# Patient Record
Sex: Male | Born: 1980 | Race: Black or African American | Hispanic: No | Marital: Married | Smoking: Former smoker
Health system: Southern US, Community
[De-identification: ages and names within clinical notes are randomized; demographics above are authoritative.]

## PROBLEM LIST (undated history)

## (undated) DIAGNOSIS — F419 Anxiety disorder, unspecified: Secondary | ICD-10-CM

## (undated) DIAGNOSIS — I10 Essential (primary) hypertension: Secondary | ICD-10-CM

## (undated) DIAGNOSIS — IMO0002 Reserved for concepts with insufficient information to code with codable children: Secondary | ICD-10-CM

## (undated) DIAGNOSIS — F329 Major depressive disorder, single episode, unspecified: Secondary | ICD-10-CM

## (undated) DIAGNOSIS — R011 Cardiac murmur, unspecified: Secondary | ICD-10-CM

## (undated) DIAGNOSIS — T7840XA Allergy, unspecified, initial encounter: Secondary | ICD-10-CM

## (undated) DIAGNOSIS — F32A Depression, unspecified: Secondary | ICD-10-CM

## (undated) DIAGNOSIS — K219 Gastro-esophageal reflux disease without esophagitis: Secondary | ICD-10-CM

## (undated) DIAGNOSIS — F1010 Alcohol abuse, uncomplicated: Secondary | ICD-10-CM

## (undated) HISTORY — DX: Essential (primary) hypertension: I10

## (undated) HISTORY — DX: Major depressive disorder, single episode, unspecified: F32.9

## (undated) HISTORY — DX: Allergy, unspecified, initial encounter: T78.40XA

## (undated) HISTORY — DX: Gastro-esophageal reflux disease without esophagitis: K21.9

## (undated) HISTORY — DX: Anxiety disorder, unspecified: F41.9

## (undated) HISTORY — DX: Depression, unspecified: F32.A

## (undated) HISTORY — PX: KNEE SURGERY: SHX244

## (undated) HISTORY — DX: Reserved for concepts with insufficient information to code with codable children: IMO0002

## (undated) HISTORY — DX: Cardiac murmur, unspecified: R01.1

## (undated) HISTORY — DX: Alcohol abuse, uncomplicated: F10.10

---
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
## 2005-07-23 ENCOUNTER — Ambulatory Visit: Payer: Self-pay | Admitting: Orthopaedic Surgery

## 2005-08-19 ENCOUNTER — Ambulatory Visit: Payer: Self-pay | Admitting: Orthopaedic Surgery

## 2006-04-13 ENCOUNTER — Emergency Department: Payer: Self-pay | Admitting: Emergency Medicine

## 2008-09-13 ENCOUNTER — Emergency Department (HOSPITAL_COMMUNITY): Admission: EM | Admit: 2008-09-13 | Discharge: 2008-09-13 | Payer: Self-pay | Admitting: Family Medicine

## 2012-12-03 ENCOUNTER — Ambulatory Visit (INDEPENDENT_AMBULATORY_CARE_PROVIDER_SITE_OTHER): Payer: 59 | Admitting: Internal Medicine

## 2012-12-03 ENCOUNTER — Encounter: Payer: Self-pay | Admitting: Internal Medicine

## 2012-12-03 VITALS — BP 124/80 | HR 96 | Temp 98.5°F | Ht 65.0 in | Wt 175.0 lb

## 2012-12-03 DIAGNOSIS — F329 Major depressive disorder, single episode, unspecified: Secondary | ICD-10-CM

## 2012-12-03 DIAGNOSIS — F419 Anxiety disorder, unspecified: Secondary | ICD-10-CM

## 2012-12-03 DIAGNOSIS — F17200 Nicotine dependence, unspecified, uncomplicated: Secondary | ICD-10-CM

## 2012-12-03 DIAGNOSIS — Z9109 Other allergy status, other than to drugs and biological substances: Secondary | ICD-10-CM

## 2012-12-03 DIAGNOSIS — L98499 Non-pressure chronic ulcer of skin of other sites with unspecified severity: Secondary | ICD-10-CM

## 2012-12-03 DIAGNOSIS — K219 Gastro-esophageal reflux disease without esophagitis: Secondary | ICD-10-CM

## 2012-12-03 DIAGNOSIS — F1010 Alcohol abuse, uncomplicated: Secondary | ICD-10-CM

## 2012-12-03 DIAGNOSIS — F411 Generalized anxiety disorder: Secondary | ICD-10-CM

## 2012-12-03 DIAGNOSIS — IMO0002 Reserved for concepts with insufficient information to code with codable children: Secondary | ICD-10-CM

## 2012-12-03 DIAGNOSIS — M25569 Pain in unspecified knee: Secondary | ICD-10-CM

## 2012-12-03 DIAGNOSIS — F32A Depression, unspecified: Secondary | ICD-10-CM

## 2012-12-03 DIAGNOSIS — M25562 Pain in left knee: Secondary | ICD-10-CM

## 2012-12-03 DIAGNOSIS — Z720 Tobacco use: Secondary | ICD-10-CM

## 2012-12-03 MED ORDER — FLUOXETINE HCL 10 MG PO CAPS: 10.0000 mg | ORAL_CAPSULE | Freq: Every day | ORAL | Status: DC

## 2012-12-03 MED ORDER — PANTOPRAZOLE SODIUM 40 MG PO TBEC: 40.0000 mg | DELAYED_RELEASE_TABLET | Freq: Two times a day (BID) | ORAL | Status: DC

## 2012-12-05 ENCOUNTER — Encounter: Payer: Self-pay | Admitting: Internal Medicine

## 2012-12-05 DIAGNOSIS — K219 Gastro-esophageal reflux disease without esophagitis: Secondary | ICD-10-CM | POA: Insufficient documentation

## 2012-12-05 DIAGNOSIS — Z9109 Other allergy status, other than to drugs and biological substances: Secondary | ICD-10-CM | POA: Insufficient documentation

## 2012-12-05 DIAGNOSIS — F320 Major depressive disorder, single episode, mild: Secondary | ICD-10-CM | POA: Insufficient documentation

## 2012-12-05 DIAGNOSIS — F419 Anxiety disorder, unspecified: Secondary | ICD-10-CM | POA: Insufficient documentation

## 2012-12-05 DIAGNOSIS — Z720 Tobacco use: Secondary | ICD-10-CM | POA: Insufficient documentation

## 2012-12-05 DIAGNOSIS — F1010 Alcohol abuse, uncomplicated: Secondary | ICD-10-CM | POA: Insufficient documentation

## 2012-12-05 DIAGNOSIS — K259 Gastric ulcer, unspecified as acute or chronic, without hemorrhage or perforation: Secondary | ICD-10-CM | POA: Insufficient documentation

## 2012-12-05 DIAGNOSIS — F329 Major depressive disorder, single episode, unspecified: Secondary | ICD-10-CM | POA: Insufficient documentation

## 2012-12-05 DIAGNOSIS — M25569 Pain in unspecified knee: Secondary | ICD-10-CM | POA: Insufficient documentation

## 2012-12-05 NOTE — Assessment & Plan Note (Signed)
Has a history of a bleeding ulcer requiring hospitalization and blood transfusions.  States never healed.  Discussed the need for f/u EGD.  Will restart protonix and zantac.  Follow.

## 2012-12-05 NOTE — Assessment & Plan Note (Signed)
Controls with zyrtec.  Follow.    

## 2012-12-05 NOTE — Assessment & Plan Note (Signed)
Discussed at length with him today regarding the need to quit.  He has tried nicotine patches.  Wants to try Chantix.  Will start chantix as directed.  Discussed possible side effects and risk of medication.  He will notify me if any problems.

## 2012-12-05 NOTE — Assessment & Plan Note (Signed)
No significant depression now.  More anxiety.  No suicidal ideations.  Start the prozac for anxiety as outlined.  Follow.

## 2012-12-05 NOTE — Assessment & Plan Note (Signed)
Off protonix and zantac now.  Symptoms returned.  Restart protonix and zantac.  Discussed with him regarding the need for f/u GI evaluation.

## 2012-12-05 NOTE — Progress Notes (Signed)
Subjective:    Patient ID: Austin [name], male    DOB: Nov 20, 1980, 32 y.o.   MRN: [id number]  HPI 32 year old male with past history of bleeding ulcer requiring blood transfusion, alcohol abuse, depression/anxiety, GERD and elevated blood pressures.  He comes in today to follow up on these issues as well as to establish care.  He has been followed previously at the [facility].  States he used to drink an increased amount of alcohol.  Had two DWIs.  During this time was hospitalized with a bleeding ulcer.  Required blood transfusions.  Was on protonix and zantac.  This regimen controlled his symptoms relatively well.  He has been out for a while.  Now starting to have some acid reflux.  Taking pepto bismol prn.  He has now cut down on his alcohol intake significantly.  Still smokes marijuana.  Relaxes him.  States he would rather smoke marijuana then drink.  He has adjusted his diet some.  Still has increased anxiety.  Will develop chest pressure when this occurs.  Has had a cardiac w/up including negative stress test, etc.  Saw cardiology.  States his chest pressure has not changed.  Notices when he is more stressed.  He previously took Buspar and would take 4-5 per day.  Has been evaluated at the [facility] - psychiatry.  He now has a new job.  Planning to get married in August.  Still has knee pain.  Limits him in his activity.  Was taking tramadol prn for this.     Past Medical History  Diagnosis Date  . Depression   . Ulcer     history of bleeding ulcer requiring blood transfusions  . GERD (gastroesophageal reflux disease)   . Allergy   . Heart murmur   . Hypertension   . Anxiety   . Alcohol abuse     history of    Outpatient Encounter Prescriptions as of 12/03/2012  Medication Sig Dispense Refill  . cetirizine (ZYRTEC) 10 MG tablet Take 10 mg by mouth daily.      . pantoprazole (PROTONIX) 40 MG tablet Take 1 tablet (40 mg total) by mouth 2 (two) times daily.  60 tablet  2  . ranitidine  (ZANTAC) 150 MG tablet Take 150 mg by mouth 2 (two) times daily.      . traMADol (ULTRAM) 50 MG tablet Take 50 mg by mouth every 6 (six) hours as needed for pain.      . [DISCONTINUED] pantoprazole (PROTONIX) 40 MG tablet Take 40 mg by mouth 2 (two) times daily.      Marland Kitchen FLUoxetine (PROZAC) 10 MG capsule Take 1 capsule (10 mg total) by mouth daily.  30 capsule  1   No facility-administered encounter medications on file as of 12/03/2012.    Review of Systems Patient denies any headache, lightheadedness or dizziness.  No significant sinus or allergy symptoms currently.  Takes zyrtec prn.  Reports the chest pressure as outlined.  Intermittent.  No  palpatations.  No increased shortness of breath, cough or congestion.  No nausea or vomiting.  No abdominal pain or cramping.  Does have acid reflux.  Off his protonix and zantac as outlined.  No bowel change, such as diarrhea, constipation, BRBPR or melana.  No urine change.   Has cut down his alcohol intake as outlined.  Knee pain as outlined.  Increased stress and anxiety as outlined.  He does smoke.  Discussed the need to quit.  He has tried  the patches.  Wants to try Chantix.      Objective:   Physical Exam Filed Vitals:   12/03/12 1429  BP: 124/80  Pulse: 96  Temp: 98.5 F (36.9 C)   Blood pressure recheck:  118/76, pulse 56  32 year old male in no acute distress.  HEENT:  Nares - clear.  Oropharynx - without lesions. NECK:  Supple.  Nontender.  No audible carotid bruit.  HEART:  Appears to be regular.   LUNGS:  No crackles or wheezing audible.  Respirations even and unlabored.   RADIAL PULSE:  Equal bilaterally.  ABDOMEN:  Soft.  Nontender.  Bowel sounds present and normal.  No audible abdominal bruit.   EXTREMITIES:  No increased edema present.  DP pulses palpable and equal bilaterally.     SKIN:  No rash.      Assessment & Plan:  CARDIOVASCULAR.  Discussed his chest pressure.  We discussed further w/up including EKG here in the office  today.  He declines further w/up at this time.  States he has seen cardiology and had w/up for these symptoms.  Will notify me or be reevaluated if symptoms change or worsen (or persist).   HEALTH MAINTENANCE.  Will get him scheduled for a complete physical.  Obtain outside records for review.    I spent 45 minutes with this patient and more than 50% of the time was spent in consultation regarding the above.

## 2012-12-05 NOTE — Assessment & Plan Note (Signed)
Increased stress and anxiety.  Has anxiety attacks.  Previously took Buspar prn.  Would like to get him on something more steady.  Discussed at length with him today.  Will start prozac 10mg  q day.  Follow closely.  Get him back in soon to reassess.

## 2012-12-05 NOTE — Assessment & Plan Note (Signed)
Has cut down significantly now.  Follow.

## 2012-12-05 NOTE — Assessment & Plan Note (Signed)
Chronic pain.  Was taking tramadol prn.  Will use tylenol prn as directed.  Follow.  May need ortho referral.  Obtain records.

## 2012-12-30 ENCOUNTER — Encounter: Payer: Self-pay | Admitting: Internal Medicine

## 2012-12-30 ENCOUNTER — Encounter: Payer: Self-pay | Admitting: *Deleted

## 2012-12-30 ENCOUNTER — Telehealth: Payer: Self-pay | Admitting: Internal Medicine

## 2012-12-30 NOTE — Telephone Encounter (Signed)
I do not have any lab results on this pt.  Need results.  Regarding the trip, I recommend him calling the travel clinic in [place] for recs.

## 2012-12-30 NOTE — Telephone Encounter (Signed)
Records requested (in your folder)

## 2012-12-30 NOTE — Telephone Encounter (Signed)
Labs good.  Cholesterol looks good.  See lab in your basket.

## 2012-12-30 NOTE — Telephone Encounter (Signed)
Sent results through my chart.

## 2013-01-13 ENCOUNTER — Encounter: Payer: Self-pay | Admitting: Internal Medicine

## 2013-01-14 ENCOUNTER — Encounter: Payer: Self-pay | Admitting: Internal Medicine

## 2013-01-14 ENCOUNTER — Ambulatory Visit (INDEPENDENT_AMBULATORY_CARE_PROVIDER_SITE_OTHER): Payer: 59 | Admitting: Internal Medicine

## 2013-01-14 VITALS — BP 144/80 | HR 57 | Temp 98.3°F | Ht 65.0 in | Wt 174.5 lb

## 2013-01-14 DIAGNOSIS — F329 Major depressive disorder, single episode, unspecified: Secondary | ICD-10-CM

## 2013-01-14 DIAGNOSIS — K219 Gastro-esophageal reflux disease without esophagitis: Secondary | ICD-10-CM

## 2013-01-14 DIAGNOSIS — Z9109 Other allergy status, other than to drugs and biological substances: Secondary | ICD-10-CM

## 2013-01-14 DIAGNOSIS — F419 Anxiety disorder, unspecified: Secondary | ICD-10-CM

## 2013-01-14 DIAGNOSIS — Z720 Tobacco use: Secondary | ICD-10-CM

## 2013-01-14 DIAGNOSIS — L98499 Non-pressure chronic ulcer of skin of other sites with unspecified severity: Secondary | ICD-10-CM

## 2013-01-14 DIAGNOSIS — F411 Generalized anxiety disorder: Secondary | ICD-10-CM

## 2013-01-14 DIAGNOSIS — F1010 Alcohol abuse, uncomplicated: Secondary | ICD-10-CM

## 2013-01-14 DIAGNOSIS — F17200 Nicotine dependence, unspecified, uncomplicated: Secondary | ICD-10-CM

## 2013-01-14 DIAGNOSIS — F32A Depression, unspecified: Secondary | ICD-10-CM

## 2013-01-14 DIAGNOSIS — IMO0002 Reserved for concepts with insufficient information to code with codable children: Secondary | ICD-10-CM

## 2013-01-16 ENCOUNTER — Telehealth: Payer: Self-pay | Admitting: Internal Medicine

## 2013-01-16 ENCOUNTER — Encounter: Payer: Self-pay | Admitting: Internal Medicine

## 2013-01-16 DIAGNOSIS — Z139 Encounter for screening, unspecified: Secondary | ICD-10-CM

## 2013-01-16 DIAGNOSIS — IMO0001 Reserved for inherently not codable concepts without codable children: Secondary | ICD-10-CM

## 2013-01-16 DIAGNOSIS — F419 Anxiety disorder, unspecified: Secondary | ICD-10-CM

## 2013-01-16 MED ORDER — FLUOXETINE HCL 20 MG PO TABS: 20.0000 mg | ORAL_TABLET | Freq: Every day | ORAL | Status: DC

## 2013-01-16 NOTE — Telephone Encounter (Signed)
Order placed for psych referral.   

## 2013-01-16 NOTE — Assessment & Plan Note (Signed)
Not drinking now.  Follow.

## 2013-01-16 NOTE — Assessment & Plan Note (Signed)
Controls with zyrtec.  Follow.    

## 2013-01-16 NOTE — Progress Notes (Signed)
Subjective:    Patient ID: Austin [name], male    DOB: February 27, 1981, 32 y.o.   MRN: [id number]  HPI 32 year old male with past history of bleeding ulcer requiring blood transfusion, alcohol abuse, depression/anxiety, GERD and elevated blood pressures.  He comes in today for a scheduled follow up.  Still with increased stress.  See last note for details.  Still not drinking.  Does not smoke cigarettes.  Does smoke marijuana to help him relax.  Increased stress with family issues.  Stress appears to be increasing.  Tolerating the prozac.  No adverse effects.  We discussed increasing the dose.  Still with increased anxiety.  Increased heart racing and elevated blood pressure when he gets anxious.  Planning to get married next month.  Increased stress related to the wedding.      Past Medical History  Diagnosis Date  . Depression   . Ulcer     history of bleeding ulcer requiring blood transfusions  . GERD (gastroesophageal reflux disease)   . Allergy   . Heart murmur   . Hypertension   . Anxiety   . Alcohol abuse     history of    Outpatient Encounter Prescriptions as of 01/14/2013  Medication Sig Dispense Refill  . cetirizine (ZYRTEC) 10 MG tablet Take 10 mg by mouth daily.      . pantoprazole (PROTONIX) 40 MG tablet Take 1 tablet (40 mg total) by mouth 2 (two) times daily.  60 tablet  2  . ranitidine (ZANTAC) 150 MG tablet Take 150 mg by mouth 2 (two) times daily.      . traMADol (ULTRAM) 50 MG tablet Take 50 mg by mouth every 6 (six) hours as needed for pain.      . [DISCONTINUED] FLUoxetine (PROZAC) 10 MG capsule Take 1 capsule (10 mg total) by mouth daily.  30 capsule  1  . FLUoxetine (PROZAC) 20 MG tablet Take 1 tablet (20 mg total) by mouth daily.  30 tablet  2   No facility-administered encounter medications on file as of 01/14/2013.    Review of Systems Patient denies any headache, lightheadedness or dizziness.  No significant sinus or allergy symptoms currently.  Takes zyrtec prn.    No increased shortness of breath, cough or congestion.  No nausea or vomiting.  No abdominal pain or cramping.  Back on protonix now.  No bowel change, such as diarrhea, constipation, BRBPR or melana.  No urine change.   Not drinking alcohol now.  Increased stress and anxiety as outlined.  States he is not smoking cigarettes now.  Is smoking marijuana.  Helps him relax.  Stress appears to be getting worse.  It appears family issues are his biggest stressor.  No suicidal ideations or homicidal ideations.       Objective:   Physical Exam  Filed Vitals:   01/14/13 1641  BP: 144/80  Pulse: 57  Temp: 98.3 F (36.8 C)   Blood pressure recheck:  43/26  32 year old male in no acute distress.  HEENT:  Nares - clear.  Oropharynx - without lesions. NECK:  Supple.  Nontender.  No audible carotid bruit.  HEART:  Appears to be regular.   LUNGS:  No crackles or wheezing audible.  Respirations even and unlabored.   RADIAL PULSE:  Equal bilaterally.  ABDOMEN:  Soft.  Nontender.  Bowel sounds present and normal.  No audible abdominal bruit.   EXTREMITIES:  No increased edema present.  DP pulses palpable and equal bilaterally.  Assessment & Plan:  CARDIOVASCULAR.  See last note.  Will treat his anxiety and try to help him deal with the increased stress.  Follow.    HEALTH MAINTENANCE.  Will get him scheduled for a complete physical.  Obtain outside records for review.

## 2013-01-16 NOTE — Assessment & Plan Note (Signed)
Increased stress and anxiety.  Has anxiety attacks.  Previously took Buspar prn.  On Prozac 10mg  q day.  Will increase to 20mg  q day.  Discussed at length with him today.  Will get him referred to psych for further evaluation and treatment.  His is comfortable and agreeable with this plan.  Follow closely.

## 2013-01-16 NOTE — Assessment & Plan Note (Signed)
On protonix.  Follow.  

## 2013-01-16 NOTE — Assessment & Plan Note (Signed)
Has a history of a bleeding ulcer requiring hospitalization and blood transfusions.  States never healed.  Discussed the need for f/u EGD.  Continue protonix.  Follow.

## 2013-01-16 NOTE — Assessment & Plan Note (Signed)
Chronic pain.  Was taking tramadol prn.  Will use tylenol prn as directed.  Follow.  May need ortho referral.  Need records.

## 2013-01-16 NOTE — Assessment & Plan Note (Signed)
Not smoking cigarettes now.  Follow.

## 2013-01-16 NOTE — Telephone Encounter (Signed)
Pt needs fasting labs within the next week.  He also needs a physical scheduled in 6 weeks.  Thanks.

## 2013-01-16 NOTE — Assessment & Plan Note (Signed)
No significant depression now.  More anxiety.  No suicidal ideations.  On prozac for anxiety as outlined.  Increase to 20mg  q day.  Refer to psych for evaluation and treatment.

## 2013-01-17 NOTE — Telephone Encounter (Signed)
Pt aware of lab appointment 01/26/13 and cpx 03/31/13

## 2013-01-19 ENCOUNTER — Telehealth: Payer: Self-pay | Admitting: Internal Medicine

## 2013-01-19 DIAGNOSIS — F32A Depression, unspecified: Secondary | ICD-10-CM

## 2013-01-19 DIAGNOSIS — F419 Anxiety disorder, unspecified: Secondary | ICD-10-CM

## 2013-01-19 DIAGNOSIS — F329 Major depressive disorder, single episode, unspecified: Secondary | ICD-10-CM

## 2013-01-19 NOTE — Telephone Encounter (Signed)
Order placed for psych referral.   

## 2013-01-26 ENCOUNTER — Other Ambulatory Visit (INDEPENDENT_AMBULATORY_CARE_PROVIDER_SITE_OTHER): Payer: 59

## 2013-01-26 DIAGNOSIS — R030 Elevated blood-pressure reading, without diagnosis of hypertension: Secondary | ICD-10-CM

## 2013-01-26 DIAGNOSIS — IMO0001 Reserved for inherently not codable concepts without codable children: Secondary | ICD-10-CM

## 2013-01-26 DIAGNOSIS — Z139 Encounter for screening, unspecified: Secondary | ICD-10-CM

## 2013-01-26 LAB — LIPID PANEL
Cholesterol: 160 mg/dL (ref 0–200)
LDL Cholesterol: 84 mg/dL (ref 0–99)
Triglycerides: 61 mg/dL (ref 0.0–149.0)
VLDL: 12.2 mg/dL (ref 0.0–40.0)

## 2013-01-26 LAB — COMPREHENSIVE METABOLIC PANEL
AST: 25 U/L (ref 0–37)
Alkaline Phosphatase: 83 U/L (ref 39–117)
BUN: 9 mg/dL (ref 6–23)
Glucose, Bld: 85 mg/dL (ref 70–99)
Sodium: 137 mEq/L (ref 135–145)
Total Bilirubin: 1.1 mg/dL (ref 0.3–1.2)
Total Protein: 7.6 g/dL (ref 6.0–8.3)

## 2013-01-30 ENCOUNTER — Encounter: Payer: Self-pay | Admitting: Internal Medicine

## 2013-02-15 ENCOUNTER — Encounter: Payer: Self-pay | Admitting: Internal Medicine

## 2013-02-16 ENCOUNTER — Encounter: Payer: Self-pay | Admitting: *Deleted

## 2013-02-16 MED ORDER — TRAMADOL HCL 50 MG PO TABS: 50.0000 mg | ORAL_TABLET | Freq: Three times a day (TID) | ORAL | Status: DC | PRN

## 2013-02-16 NOTE — Telephone Encounter (Signed)
Refilled tramadol.

## 2013-02-17 ENCOUNTER — Telehealth: Payer: Self-pay | Admitting: *Deleted

## 2013-02-17 NOTE — Telephone Encounter (Signed)
Pt came by to pick up Rx & I asked him about the psych referral. Pt states that he still plans to proceed but he has had a lot going on right now. He is getting married tomorrow also. He plans to set up appt soon.

## 2013-02-25 ENCOUNTER — Other Ambulatory Visit: Payer: Self-pay | Admitting: *Deleted

## 2013-03-07 ENCOUNTER — Encounter: Payer: Self-pay | Admitting: Internal Medicine

## 2013-03-24 ENCOUNTER — Encounter: Payer: Self-pay | Admitting: Internal Medicine

## 2013-03-31 ENCOUNTER — Encounter: Payer: 59 | Admitting: Internal Medicine

## 2013-04-01 ENCOUNTER — Other Ambulatory Visit: Payer: Self-pay | Admitting: *Deleted

## 2013-04-01 ENCOUNTER — Encounter: Payer: Self-pay | Admitting: *Deleted

## 2013-04-01 MED ORDER — PANTOPRAZOLE SODIUM 40 MG PO TBEC: 40.0000 mg | DELAYED_RELEASE_TABLET | Freq: Two times a day (BID) | ORAL | Status: DC

## 2013-04-01 MED ORDER — FLUOXETINE HCL 20 MG PO TABS: 20.0000 mg | ORAL_TABLET | Freq: Every day | ORAL | Status: DC

## 2013-04-01 MED ORDER — TRAMADOL HCL 50 MG PO TABS: 50.0000 mg | ORAL_TABLET | Freq: Three times a day (TID) | ORAL | Status: DC | PRN

## 2013-04-01 NOTE — Telephone Encounter (Signed)
Sent pt a mychart message reminding him that he needs to r/s his physical that he missed yesterday. And also informed that his Tramadol would have to be sent locally not through [facility]

## 2013-04-11 ENCOUNTER — Other Ambulatory Visit: Payer: Self-pay | Admitting: Internal Medicine

## 2013-04-12 ENCOUNTER — Telehealth: Payer: Self-pay | Admitting: Internal Medicine

## 2013-04-12 NOTE — Telephone Encounter (Signed)
traMADol (ULTRAM) 50 MG tablet ° °

## 2013-04-12 NOTE — Telephone Encounter (Signed)
I have already faxed this on 04/01/13. I called the pharmacy & they informed me that he has it on file. They are going to fill it today & let him know.

## 2013-05-05 ENCOUNTER — Other Ambulatory Visit: Payer: Self-pay

## 2013-05-30 ENCOUNTER — Other Ambulatory Visit: Payer: Self-pay | Admitting: Internal Medicine

## 2013-05-30 ENCOUNTER — Ambulatory Visit (INDEPENDENT_AMBULATORY_CARE_PROVIDER_SITE_OTHER): Payer: 59 | Admitting: Adult Health

## 2013-05-30 VITALS — BP 134/70 | HR 80 | Temp 99.1°F | Wt 167.0 lb

## 2013-05-30 DIAGNOSIS — R112 Nausea with vomiting, unspecified: Secondary | ICD-10-CM

## 2013-05-30 DIAGNOSIS — R197 Diarrhea, unspecified: Secondary | ICD-10-CM

## 2013-05-30 MED ORDER — PROMETHAZINE HCL 25 MG PO TABS: 25.0000 mg | ORAL_TABLET | Freq: Three times a day (TID) | ORAL | Status: DC | PRN

## 2013-05-30 NOTE — Progress Notes (Signed)
   Subjective:    Patient ID: Fynn Vanblarcom, male    DOB: [birth year], 32 y.o.   MRN: [id number]  HPI  Patient is a pleasant 32 y/o male who presents to clinic with n/v/d since Sunday morning.  He reports going to eat thanksgiving dinner with his wife's family on Saturday evening and started to feel nauseated several hours following. He is running a low grade fever.   Current Outpatient Prescriptions on File Prior to Visit  Medication Sig Dispense Refill  . cetirizine (ZYRTEC) 10 MG tablet Take 10 mg by mouth daily.      Marland Kitchen FLUoxetine (PROZAC) 20 MG tablet Take 1 tablet by mouth  daily  90 tablet  0  . pantoprazole (PROTONIX) 40 MG tablet TAKE 1 TABLET BY MOUTH TWICE DAILY  60 tablet  5  . pantoprazole (PROTONIX) 40 MG tablet Take 1 tablet by mouth two  times daily  180 tablet  1  . ranitidine (ZANTAC) 150 MG tablet Take 150 mg by mouth 2 (two) times daily.      . traMADol (ULTRAM) 50 MG tablet Take 1 tablet (50 mg total) by mouth every 8 (eight) hours as needed for pain.  40 tablet  0   No current facility-administered medications on file prior to visit.     Review of Systems  Constitutional: Positive for fever and chills.  Gastrointestinal: Positive for nausea, vomiting and diarrhea.  Psychiatric/Behavioral: The patient is hyperactive.        Objective:   Physical Exam  Constitutional: He is oriented to person, place, and time. He appears well-developed and well-nourished. No distress.  Cardiovascular: Normal rate, regular rhythm and normal heart sounds.  Exam reveals no gallop.   No murmur heard. Pulmonary/Chest: Effort normal. No respiratory distress.  Abdominal: Soft.  Hyperactive bowel sounds  Neurological: He is alert and oriented to person, place, and time.  Psychiatric: He has a normal mood and affect. His behavior is normal. Judgment and thought content normal.          Assessment & Plan:

## 2013-05-30 NOTE — Assessment & Plan Note (Signed)
Symptoms occurred after eating at family gathering on Saturday evening. Phenergan for n/v. Clear liquids today. May introduce bland diet tomorrow. Return to work on Wednesday if symptoms subsided x 24 hours.

## 2013-05-30 NOTE — Patient Instructions (Signed)
Start phenergan 25 mg every 8 hours as needed for nausea and vomiting. This may cause sedation. If too strong, may cut pill in half.  Drink clear liquids today.  Start a bland diet tomorrow - dry toast, plain potato. No heavy sauces, spices, etc.  If no improvement within 2-3 days please call.    Nausea and Vomiting Nausea is a sick feeling that often comes before throwing up (vomiting). Vomiting is a reflex where stomach contents come out of your mouth. Vomiting can cause severe loss of body fluids (dehydration). Children and elderly adults can become dehydrated quickly, especially if they also have diarrhea. Nausea and vomiting are symptoms of a condition or disease. It is important to find the cause of your symptoms. CAUSES   Direct irritation of the stomach lining. This irritation can result from increased acid production (gastroesophageal reflux disease), infection, food poisoning, taking certain medicines (such as nonsteroidal anti-inflammatory drugs), alcohol use, or tobacco use.  Signals from the brain.These signals could be caused by a headache, heat exposure, an inner ear disturbance, increased pressure in the brain from injury, infection, a tumor, or a concussion, pain, emotional stimulus, or metabolic problems.  An obstruction in the gastrointestinal tract (bowel obstruction).  Illnesses such as diabetes, hepatitis, gallbladder problems, appendicitis, kidney problems, cancer, sepsis, atypical symptoms of a heart attack, or eating disorders.  Medical treatments such as chemotherapy and radiation.  Receiving medicine that makes you sleep (general anesthetic) during surgery. DIAGNOSIS Your caregiver may ask for tests to be done if the problems do not improve after a few days. Tests may also be done if symptoms are severe or if the reason for the nausea and vomiting is not clear. Tests may include:  Urine tests.  Blood tests.  Stool tests.  Cultures (to look for evidence  of infection).  X-rays or other imaging studies. Test results can help your caregiver make decisions about treatment or the need for additional tests. TREATMENT You need to stay well hydrated. Drink frequently but in small amounts.You may wish to drink water, sports drinks, clear broth, or eat frozen ice pops or gelatin dessert to help stay hydrated.When you eat, eating slowly may help prevent nausea.There are also some antinausea medicines that may help prevent nausea. HOME CARE INSTRUCTIONS   Take all medicine as directed by your caregiver.  If you do not have an appetite, do not force yourself to eat. However, you must continue to drink fluids.  If you have an appetite, eat a normal diet unless your caregiver tells you differently.  Eat a variety of complex carbohydrates (rice, wheat, potatoes, bread), lean meats, yogurt, fruits, and vegetables.  Avoid high-fat foods because they are more difficult to digest.  Drink enough water and fluids to keep your urine clear or pale yellow.  If you are dehydrated, ask your caregiver for specific rehydration instructions. Signs of dehydration may include:  Severe thirst.  Dry lips and mouth.  Dizziness.  Dark urine.  Decreasing urine frequency and amount.  Confusion.  Rapid breathing or pulse. SEEK IMMEDIATE MEDICAL CARE IF:   You have blood or brown flecks (like coffee grounds) in your vomit.  You have black or bloody stools.  You have a severe headache or stiff neck.  You are confused.  You have severe abdominal pain.  You have chest pain or trouble breathing.  You do not urinate at least once every 8 hours.  You develop cold or clammy skin.  You continue to vomit for longer  than 24 to 48 hours.  You have a fever. MAKE SURE YOU:   Understand these instructions.  Will watch your condition.  Will get help right away if you are not doing well or get worse. Document Released: 06/16/2005 Document Revised:  09/08/2011 Document Reviewed: 11/13/2010 [facility] Patient Information 2014 [place], [place].

## 2013-05-30 NOTE — Progress Notes (Signed)
Pre visit review using our clinic review tool, if applicable. No additional management support is needed unless otherwise documented below in the visit note. 

## 2013-06-09 ENCOUNTER — Encounter: Payer: Self-pay | Admitting: Internal Medicine

## 2013-06-09 ENCOUNTER — Ambulatory Visit (INDEPENDENT_AMBULATORY_CARE_PROVIDER_SITE_OTHER): Payer: 59 | Admitting: Internal Medicine

## 2013-06-09 VITALS — BP 130/80 | HR 65 | Temp 98.2°F | Ht 64.25 in | Wt 173.0 lb

## 2013-06-09 DIAGNOSIS — L98499 Non-pressure chronic ulcer of skin of other sites with unspecified severity: Secondary | ICD-10-CM

## 2013-06-09 DIAGNOSIS — Z720 Tobacco use: Secondary | ICD-10-CM

## 2013-06-09 DIAGNOSIS — F32A Depression, unspecified: Secondary | ICD-10-CM

## 2013-06-09 DIAGNOSIS — IMO0002 Reserved for concepts with insufficient information to code with codable children: Secondary | ICD-10-CM

## 2013-06-09 DIAGNOSIS — R0789 Other chest pain: Secondary | ICD-10-CM

## 2013-06-09 DIAGNOSIS — F17200 Nicotine dependence, unspecified, uncomplicated: Secondary | ICD-10-CM

## 2013-06-09 DIAGNOSIS — F3289 Other specified depressive episodes: Secondary | ICD-10-CM

## 2013-06-09 DIAGNOSIS — F329 Major depressive disorder, single episode, unspecified: Secondary | ICD-10-CM

## 2013-06-09 DIAGNOSIS — F1010 Alcohol abuse, uncomplicated: Secondary | ICD-10-CM

## 2013-06-09 DIAGNOSIS — F411 Generalized anxiety disorder: Secondary | ICD-10-CM

## 2013-06-09 DIAGNOSIS — K219 Gastro-esophageal reflux disease without esophagitis: Secondary | ICD-10-CM

## 2013-06-09 DIAGNOSIS — F419 Anxiety disorder, unspecified: Secondary | ICD-10-CM

## 2013-06-09 DIAGNOSIS — Z9109 Other allergy status, other than to drugs and biological substances: Secondary | ICD-10-CM

## 2013-06-09 MED ORDER — FLUTICASONE PROPIONATE 50 MCG/ACT NA SUSP: 2.0000 | Freq: Every day | NASAL | Status: DC

## 2013-06-09 NOTE — Progress Notes (Signed)
Pre-visit discussion using our clinic review tool. No additional management support is needed unless otherwise documented below in the visit note.  

## 2013-06-09 NOTE — Progress Notes (Signed)
Subjective:    Patient ID: Austin [name], male    DOB: 02/02/1981, 32 y.o.   MRN: [id number]  HPI 32 year old male with past history of bleeding ulcer requiring blood transfusion, alcohol abuse, depression/anxiety, GERD and elevated blood pressures.  He comes in today to follow up on these issues as well as for a complete physical exam. Still with increased stress.  See last note for details.  Started smoking cigarettes since his last visit.  Does smoke marijuana to help him relax.  Increased stress with family issues.  Tolerating the prozac.  No adverse effects.  Still with increased anxiety.  Increased heart racing and chest pressure at times.  Breathing overall stable.  He does report some increased drainage.  Sore throat.  Some break through acid production.  Takes pepto bismol or Tums.  Some hoarseness.  Taking zyrtec and benadryl.  Has developed some cough over the last 10 days.       Past Medical History  Diagnosis Date  . Depression   . Ulcer     history of bleeding ulcer requiring blood transfusions  . GERD (gastroesophageal reflux disease)   . Allergy   . Heart murmur   . Hypertension   . Anxiety   . Alcohol abuse     history of    Outpatient Encounter Prescriptions as of 06/09/2013  Medication Sig  . cetirizine (ZYRTEC) 10 MG tablet Take 10 mg by mouth daily.  Marland Kitchen FLUoxetine (PROZAC) 20 MG tablet Take 1 tablet by mouth  daily  . pantoprazole (PROTONIX) 40 MG tablet Take 1 tablet by mouth two  times daily  . promethazine (PHENERGAN) 25 MG tablet Take 1 tablet (25 mg total) by mouth every 8 (eight) hours as needed for nausea or vomiting.  . ranitidine (ZANTAC) 150 MG tablet Take 150 mg by mouth 2 (two) times daily.  . traMADol (ULTRAM) 50 MG tablet Take 1 tablet (50 mg total) by mouth every 8 (eight) hours as needed for pain.  . [DISCONTINUED] pantoprazole (PROTONIX) 40 MG tablet TAKE 1 TABLET BY MOUTH TWICE DAILY    Review of Systems Patient denies any headache, lightheadedness  or dizziness.  Some increased drainage and sore throat.  Some hoarseness.  Takes zyrtec prn.   No increased shortness of breath.  Some cough.  No nausea or vomiting.  No abdominal pain or cramping.  Back on protonix now.  Still some breakthrough acid.  Taking pepto bismol and Tums.  No bowel change, such as diarrhea, constipation, BRBPR or melana.  No urine change.    Increased stress and anxiety as outlined.  States he has started smoking cigarettes again.  Is smoking marijuana.  Helps him relax.  Increased stress.  We had referred him to psych previously.  Not able to go.  Is agreeable now for referral.  Taking his prozac.  Still has some increased heart racing and chest tightness.        Objective:   Physical Exam  Filed Vitals:   06/09/13 1111  BP: 130/80  Pulse: 65  Temp: 98.2 F (36.8 C)   Blood pressure recheck:  122/78, pulse 73  32 year old male in no acute distress.  HEENT:  Nares - clear.  Oropharynx - without lesions. NECK:  Supple.  Nontender.  No audible carotid bruit.  HEART:  Appears to be regular.   LUNGS:  No crackles or wheezing audible.  Respirations even and unlabored.   RADIAL PULSE:  Equal bilaterally.  ABDOMEN:  Soft.  Nontender.  Bowel sounds present and normal.  No audible abdominal bruit.  GU:  Normal descended testicles.  No palpable testicular nodules.   RECTAL:  Not performed.   EXTREMITIES:  No increased edema present.  DP pulses palpable and equal bilaterally.         Assessment & Plan:  CARDIOVASCULAR.  Still with intermittent chest tightness and heart racing.  Previously felt to be related to increased stress.  On prozac.  Still continuing.  EKG obtained and revealed SR with no acute ischemic changes.  Given persistent symptoms, will obtain stress test to confirm no active ischemia.    HEALTH MAINTENANCE.  Physical today.

## 2013-06-09 NOTE — Patient Instructions (Signed)
Can take robitussin twice a day as needed.  Flonase nasal spray - 2 sprays each nostril one time per day.  Do in the evening.  Saline nasal spray - flush nose at least 2-3x/day.   Take your protonix - one before breakfast and one before your evening meal.  Take your zantac before bed.

## 2013-06-12 ENCOUNTER — Encounter: Payer: Self-pay | Admitting: Internal Medicine

## 2013-06-12 NOTE — Assessment & Plan Note (Signed)
No significant depression now.  More anxiety.  No suicidal ideations.  On prozac for anxiety as outlined.  On 20mg  q day.  Refer to psych for evaluation and treatment.

## 2013-06-12 NOTE — Assessment & Plan Note (Signed)
Usually controls with zyrtec.  Now with increased drainage and cough.  Treat acid reflux.  Flonase nasal spray and saline nasal spray as directed.  Follow.  Notify me if symptom worsen or do not resolve.

## 2013-06-12 NOTE — Assessment & Plan Note (Signed)
Has a history of a bleeding ulcer requiring hospitalization and blood transfusions.  States never healed.  Discussed the need for f/u EGD.  Still with increased acid reflux despite protonix.  Increase protonix to bid.  Refer to GI for further evaluation and need for EGD.

## 2013-06-12 NOTE — Assessment & Plan Note (Signed)
States he only rarely drinks now.  Nothing on a regular basis. Follow.

## 2013-06-12 NOTE — Assessment & Plan Note (Signed)
On protonix  Still with break through symptoms.  Increase protonix to bid.  Refer to GI for evaluation for possible EGD.

## 2013-06-12 NOTE — Assessment & Plan Note (Signed)
Have discussed at length with him regarding the need to quit.  He has tried nicotine patches.  Wanted to try Chantix.  Still smoking.  Follow.

## 2013-06-12 NOTE — Assessment & Plan Note (Signed)
Increased stress and anxiety.  Has anxiety attacks.  Previously took Buspar prn.  On Prozac 20mg  q day.  Discussed at length with him today.  Will get him referred to psych for further evaluation and treatment.  His is comfortable and agreeable with this plan.  Follow closely.

## 2013-06-13 ENCOUNTER — Encounter: Payer: Self-pay | Admitting: Emergency Medicine

## 2013-06-14 ENCOUNTER — Telehealth: Payer: Self-pay | Admitting: Emergency Medicine

## 2013-06-14 NOTE — Telephone Encounter (Signed)
LVM for patient to call our office in reference to Stress Echo scheduled with [facility], located at [address] apt scheduled on 06/17/13 @ 9am.

## 2013-06-15 NOTE — Telephone Encounter (Signed)
LVM #2 to return our call to confirm he has seen his apt with [facility] on Friday

## 2013-06-16 ENCOUNTER — Telehealth: Payer: Self-pay | Admitting: *Deleted

## 2013-06-16 NOTE — Telephone Encounter (Signed)
Left patient voicemail to remind him he had stress echo tomorrow at 0900.

## 2013-06-17 ENCOUNTER — Other Ambulatory Visit (INDEPENDENT_AMBULATORY_CARE_PROVIDER_SITE_OTHER): Payer: 59

## 2013-06-17 DIAGNOSIS — R0789 Other chest pain: Secondary | ICD-10-CM

## 2013-06-17 DIAGNOSIS — R079 Chest pain, unspecified: Secondary | ICD-10-CM

## 2013-06-18 ENCOUNTER — Encounter: Payer: Self-pay | Admitting: Internal Medicine

## 2013-06-27 ENCOUNTER — Ambulatory Visit: Payer: 59

## 2013-07-05 ENCOUNTER — Ambulatory Visit (INDEPENDENT_AMBULATORY_CARE_PROVIDER_SITE_OTHER): Payer: 59 | Admitting: Psychology

## 2013-07-05 DIAGNOSIS — F1210 Cannabis abuse, uncomplicated: Secondary | ICD-10-CM

## 2013-07-20 ENCOUNTER — Ambulatory Visit: Payer: 59 | Admitting: Psychology

## 2013-08-17 ENCOUNTER — Ambulatory Visit: Payer: 59 | Admitting: Internal Medicine

## 2013-09-26 ENCOUNTER — Telehealth: Payer: Self-pay | Admitting: Internal Medicine

## 2013-09-26 NOTE — Telephone Encounter (Signed)
Pt came into office requesting refill of tramadol.  States he has been out for a while.

## 2013-09-27 NOTE — Telephone Encounter (Signed)
Okay to refill? Last seen in December 2014

## 2013-09-28 ENCOUNTER — Other Ambulatory Visit: Payer: Self-pay | Admitting: *Deleted

## 2013-09-28 MED ORDER — TRAMADOL HCL 50 MG PO TABS: 50.0000 mg | ORAL_TABLET | Freq: Three times a day (TID) | ORAL | Status: DC | PRN

## 2013-09-28 NOTE — Telephone Encounter (Signed)
Please confirm why he needs the medication.  Any new or acute pain or problems?  Just need more info.  Thanks.  Also, he needs a f/u appt with me ( ) appt.

## 2013-09-28 NOTE — Telephone Encounter (Signed)
Pt states the he has the same problems he always had, nothing new & pain hasn't increased. He has been trying to use Tylenol in place of the Tramadol. Pt notified that he will need to schedule a 30 minute follow-up soon.

## 2013-09-29 NOTE — Telephone Encounter (Signed)
Rx faxed to pharmacy  

## 2013-11-14 ENCOUNTER — Telehealth: Payer: Self-pay | Admitting: Internal Medicine

## 2013-11-14 ENCOUNTER — Encounter: Payer: Self-pay | Admitting: *Deleted

## 2013-11-14 NOTE — Telephone Encounter (Signed)
Sent mychart to patient recommending that he goes to ER or acute care if this is a new or recent problem with the tightening in chest and numbness through fingertips.

## 2013-11-14 NOTE — Telephone Encounter (Signed)
See below my chart message is it ok to wait until 5/21  Appointment For: Austin [name],Austin [name] ([id number])  Visit Type: MYCHART OFFICE VISIT (1064) 11/17/2013 11:00 AM 15 mins.  Charm Bargesharlene S Scott, MD [facility]  Patient Comments: Office Visit need refill on anxiety medication as well as tramadol. Also just discuss general health issues. Still tightening in chest and numbness through fingertips

## 2013-11-17 ENCOUNTER — Encounter: Payer: Self-pay | Admitting: Internal Medicine

## 2013-11-17 ENCOUNTER — Ambulatory Visit (INDEPENDENT_AMBULATORY_CARE_PROVIDER_SITE_OTHER): Payer: 59 | Admitting: Internal Medicine

## 2013-11-17 VITALS — BP 130/76 | HR 83 | Temp 98.5°F | Resp 16 | Ht 64.25 in | Wt 168.0 lb

## 2013-11-17 DIAGNOSIS — M79609 Pain in unspecified limb: Secondary | ICD-10-CM

## 2013-11-17 DIAGNOSIS — F329 Major depressive disorder, single episode, unspecified: Secondary | ICD-10-CM

## 2013-11-17 DIAGNOSIS — Z720 Tobacco use: Secondary | ICD-10-CM

## 2013-11-17 DIAGNOSIS — IMO0002 Reserved for concepts with insufficient information to code with codable children: Secondary | ICD-10-CM

## 2013-11-17 DIAGNOSIS — F17200 Nicotine dependence, unspecified, uncomplicated: Secondary | ICD-10-CM

## 2013-11-17 DIAGNOSIS — F411 Generalized anxiety disorder: Secondary | ICD-10-CM

## 2013-11-17 DIAGNOSIS — R079 Chest pain, unspecified: Secondary | ICD-10-CM

## 2013-11-17 DIAGNOSIS — K219 Gastro-esophageal reflux disease without esophagitis: Secondary | ICD-10-CM

## 2013-11-17 DIAGNOSIS — F1010 Alcohol abuse, uncomplicated: Secondary | ICD-10-CM

## 2013-11-17 DIAGNOSIS — F3289 Other specified depressive episodes: Secondary | ICD-10-CM

## 2013-11-17 DIAGNOSIS — F32A Depression, unspecified: Secondary | ICD-10-CM

## 2013-11-17 DIAGNOSIS — F419 Anxiety disorder, unspecified: Secondary | ICD-10-CM

## 2013-11-17 DIAGNOSIS — M79602 Pain in left arm: Secondary | ICD-10-CM

## 2013-11-17 DIAGNOSIS — L98499 Non-pressure chronic ulcer of skin of other sites with unspecified severity: Secondary | ICD-10-CM

## 2013-11-17 MED ORDER — FLUOXETINE HCL 20 MG PO TABS: 20.0000 mg | ORAL_TABLET | Freq: Every day | ORAL | Status: DC

## 2013-11-17 NOTE — Progress Notes (Signed)
Pre visit review using our clinic review tool, if applicable. No additional management support is needed unless otherwise documented below in the visit note. 

## 2013-11-21 ENCOUNTER — Encounter: Payer: Self-pay | Admitting: Internal Medicine

## 2013-11-21 DIAGNOSIS — R079 Chest pain, unspecified: Secondary | ICD-10-CM | POA: Insufficient documentation

## 2013-11-21 NOTE — Assessment & Plan Note (Signed)
Chest pain as outlined.  Having chest and arm pain and numbness.  Exam as outlined.  Symptoms reproducible on exam.  Recent stress test negative.  Appears to be related more to an underlying nerve.  Will have him wear cock up wrist splints if tolerates.  Hold on further cardiac testing.  Offered repeat EKG today to confirm no change.  He declines.  Will schedule NCS upper extremities.  Further w/up pending results.

## 2013-11-21 NOTE — Assessment & Plan Note (Signed)
Has a history of a bleeding ulcer requiring hospitalization and blood transfusions.  States never healed.  Discussed the need for f/u EGD.  Was referred to GI for further evaluation and need for EGD.  On protonix.

## 2013-11-21 NOTE — Assessment & Plan Note (Signed)
Increased stress and anxiety.  Has anxiety attacks.  Previously took Buspar prn.  Was on Prozac 20mg  q day.  Ran out.  Plans to restart.  Felt was doing well on the prozac.  Follow.

## 2013-11-21 NOTE — Assessment & Plan Note (Signed)
Restart prozac as outlined.

## 2013-11-21 NOTE — Assessment & Plan Note (Signed)
On protonix  No reported symptoms today.  Follow.   

## 2013-11-21 NOTE — Assessment & Plan Note (Signed)
Have discussed at length with him regarding the need to quit.  He has tried nicotine patches.  Still smoking.  Follow.     

## 2013-11-21 NOTE — Progress Notes (Signed)
Subjective:    Patient ID: Austin [name], male    DOB: 03-05-1981, 33 y.o.   MRN: [id number]  Chest Pain   33 year old male with past history of bleeding ulcer requiring blood transfusion, alcohol abuse, depression/anxiety, GERD and elevated blood pressures.  He comes in today for a scheduled follow up.  Still with increased stress.  See previous notes for details.  Is smoking.  Discussed the need to quit.   Does smoke marijuana to help him relax.  Increased stress with family issues.  Off prozac.  Ran out of medication.  No adverse effects.  Chest pressure at times.  Breathing overall stable.  He reports shooting pain across his chest and will notice numbness in his fingers and left arm.  No motor weakness.  Certain position changes and palpation around the elbow reproduces the pain and discomfort.  He also reports some worsening symptoms when he wakes up.  Uses his hands a lot.  On the computer a lot.      Past Medical History  Diagnosis Date  . Depression   . Ulcer     history of bleeding ulcer requiring blood transfusions  . GERD (gastroesophageal reflux disease)   . Allergy   . Heart murmur   . Hypertension   . Anxiety   . Alcohol abuse     history of    Outpatient Encounter Prescriptions as of 11/17/2013  Medication Sig  . cetirizine (ZYRTEC) 10 MG tablet Take 10 mg by mouth daily.  Marland Kitchen FLUoxetine (PROZAC) 20 MG tablet Take 1 tablet (20 mg total) by mouth daily.  . fluticasone (FLONASE) 50 MCG/ACT nasal spray Place 2 sprays into both nostrils daily.  . pantoprazole (PROTONIX) 40 MG tablet Take 1 tablet by mouth two  times daily  . promethazine (PHENERGAN) 25 MG tablet Take 1 tablet (25 mg total) by mouth every 8 (eight) hours as needed for nausea or vomiting.  . ranitidine (ZANTAC) 150 MG tablet Take 150 mg by mouth 2 (two) times daily.  . traMADol (ULTRAM) 50 MG tablet Take 1 tablet (50 mg total) by mouth every 8 (eight) hours as needed.  . [DISCONTINUED] FLUoxetine (PROZAC) 20 MG  tablet Take 1 tablet by mouth  daily    Review of Systems  Cardiovascular: Positive for chest pain.  Patient denies any headache, lightheadedness or dizziness.  No increased shortness of breath.  No cough or congestion.  No nausea or vomiting.  No abdominal pain or cramping.  On protonix.  No increased acid reflux reported today.  No bowel change, such as diarrhea, constipation, BRBPR or melana.  No urine change.    Increased stress and anxiety as outlined.  States he has started smoking cigarettes again.  Is smoking marijuana.  Helps him relax.  Increased stress.  We had referred him to psych previously.  Off his prozac.  Ran out.  Plans to restart.  Describes the chest discomfort and arm sensation changes as outlined.  Feels better when he exercises.        Objective:   Physical Exam  Filed Vitals:   11/17/13 1100  BP: 130/76  Pulse: 83  Temp: 98.5 F (36.9 C)  Resp: 54   33 year old male in no acute distress.  HEENT:  Nares - clear.  Oropharynx - without lesions. NECK:  Supple.  Nontender.  No audible carotid bruit.  HEART:  Appears to be regular.   LUNGS:  No crackles or wheezing audible.  Respirations even and  unlabored. CHEST WALL:  Some reproducible tenderness to palpation over the left anterior chest wall.    RADIAL PULSE:  Equal bilaterally.  ABDOMEN:  Soft.  Nontender.  Bowel sounds present and normal.  No audible abdominal bruit.   EXTREMITIES:  No increased edema present.  DP pulses palpable and equal bilaterally.   MSK:  Reproducible symptoms with palpation over the left elbow.  Positive phalens.  Grip strength appears to be equal bilateral hands.         Assessment & Plan:  HEALTH MAINTENANCE.  Physical 06/09/13.   I spent 25 minutes with the patient and more than 50% of the time was spent in consultation regarding the above.

## 2013-11-21 NOTE — Assessment & Plan Note (Signed)
Alcohol intake has increased some recently.  We discussed this at length.  Explained that if we restart the prozac, he is going to have to stop the alcohol.  He states he would.  Does not drink on a regular basis.  Follow.

## 2013-11-24 ENCOUNTER — Encounter: Payer: Self-pay | Admitting: Internal Medicine

## 2013-11-25 MED ORDER — PANTOPRAZOLE SODIUM 40 MG PO TBEC: DELAYED_RELEASE_TABLET | ORAL | Status: DC

## 2013-11-25 NOTE — Telephone Encounter (Signed)
Pt lost his [facility] slip, needs another. I couldn't find what labs he needs

## 2014-01-04 ENCOUNTER — Encounter: Payer: Self-pay | Admitting: Internal Medicine

## 2014-01-04 ENCOUNTER — Ambulatory Visit (INDEPENDENT_AMBULATORY_CARE_PROVIDER_SITE_OTHER): Payer: 59 | Admitting: Internal Medicine

## 2014-01-04 VITALS — BP 120/80 | HR 67 | Temp 98.6°F | Ht 64.25 in | Wt 171.2 lb

## 2014-01-04 DIAGNOSIS — F1010 Alcohol abuse, uncomplicated: Secondary | ICD-10-CM

## 2014-01-04 DIAGNOSIS — F329 Major depressive disorder, single episode, unspecified: Secondary | ICD-10-CM

## 2014-01-04 DIAGNOSIS — F17200 Nicotine dependence, unspecified, uncomplicated: Secondary | ICD-10-CM

## 2014-01-04 DIAGNOSIS — F32A Depression, unspecified: Secondary | ICD-10-CM

## 2014-01-04 DIAGNOSIS — F419 Anxiety disorder, unspecified: Secondary | ICD-10-CM

## 2014-01-04 DIAGNOSIS — K219 Gastro-esophageal reflux disease without esophagitis: Secondary | ICD-10-CM

## 2014-01-04 DIAGNOSIS — Z9109 Other allergy status, other than to drugs and biological substances: Secondary | ICD-10-CM

## 2014-01-04 DIAGNOSIS — F411 Generalized anxiety disorder: Secondary | ICD-10-CM

## 2014-01-04 DIAGNOSIS — F3289 Other specified depressive episodes: Secondary | ICD-10-CM

## 2014-01-04 DIAGNOSIS — Z720 Tobacco use: Secondary | ICD-10-CM

## 2014-01-04 DIAGNOSIS — R079 Chest pain, unspecified: Secondary | ICD-10-CM

## 2014-01-04 NOTE — Progress Notes (Signed)
Pre visit review using our clinic review tool, if applicable. No additional management support is needed unless otherwise documented below in the visit note. 

## 2014-01-08 ENCOUNTER — Encounter: Payer: Self-pay | Admitting: Internal Medicine

## 2014-01-08 NOTE — Assessment & Plan Note (Signed)
On protonix  No reported symptoms today.  Follow.   

## 2014-01-08 NOTE — Assessment & Plan Note (Signed)
He has cut back on his alcohol.  Instructed to quit.  Follow.

## 2014-01-08 NOTE — Assessment & Plan Note (Signed)
Plans to stop smoking.  Follow.

## 2014-01-08 NOTE — Progress Notes (Signed)
Subjective:    Patient ID: Austin [name], male    DOB: 1981/06/27, 33 y.o.   MRN: [id number]  HPI 33 year old male with past history of bleeding ulcer requiring blood transfusion, alcohol abuse, depression/anxiety, GERD and elevated blood pressures.  He comes in today for a scheduled follow up.   Still with increased stress.  See last note for details.  Stress is some better.  Does smoke marijuana to help him relax.  On prozac.  Tolerating the prozac.  No adverse effects.  Still with increased anxiety.  Increased heart racing and chest pressure better.  Breathing overall stable.  Was having some issues with his hand and arm.  See last note for details.  Had NCS.  No abnormality noted.  Felt to be more muscle strain.  Is better with increased exercise.  He is swimming.  Feels better after he swims.  Overall feeling better.  He has cut back on his alcohol intake.  Wife is pregnant.       Past Medical History  Diagnosis Date  . Depression   . Ulcer     history of bleeding ulcer requiring blood transfusions  . GERD (gastroesophageal reflux disease)   . Allergy   . Heart murmur   . Hypertension   . Anxiety   . Alcohol abuse     history of    Outpatient Encounter Prescriptions as of 01/04/2014  Medication Sig  . cetirizine (ZYRTEC) 10 MG tablet Take 10 mg by mouth daily.  Marland Kitchen. FLUoxetine (PROZAC) 20 MG tablet Take 1 tablet (20 mg total) by mouth daily.  . fluticasone (FLONASE) 50 MCG/ACT nasal spray Place 2 sprays into both nostrils daily.  . pantoprazole (PROTONIX) 40 MG tablet Take 1 tablet by mouth two  times daily  . promethazine (PHENERGAN) 25 MG tablet Take 1 tablet (25 mg total) by mouth every 8 (eight) hours as needed for nausea or vomiting.  . ranitidine (ZANTAC) 150 MG tablet Take 150 mg by mouth 2 (two) times daily.  . traMADol (ULTRAM) 50 MG tablet Take 1 tablet (50 mg total) by mouth every 8 (eight) hours as needed.    Review of Systems Patient denies any headache, lightheadedness  or dizziness.   No increased shortness of breath.  No cough or congestion. No nausea or vomiting.  No abdominal pain or cramping.  On protonix.   No bowel change, such as diarrhea, constipation, BRBPR or melana.  No urine change.    Increased stress and anxiety as outlined.  Is smoking marijuana.  Helps him relax. Has cut back down on his alcohol.  We discussed stopping completely.  He plans to stop smoking.  We had referred him to psych.  He went.  No changes made per his report.  Does not feel he needs to return.  Arm discomfort better.  Work up as outlined.  Overall doing better.          Objective:   Physical Exam  Filed Vitals:   01/04/14 1301  BP: 120/80  Pulse: 67  Temp: 98.6 F (7337 C)   33 year old male in no acute distress.  HEENT:  Nares - clear.  Oropharynx - without lesions. NECK:  Supple.  Nontender.  No audible carotid bruit.  HEART:  Appears to be regular.   LUNGS:  No crackles or wheezing audible.  Respirations even and unlabored.   RADIAL PULSE:  Equal bilaterally.  ABDOMEN:  Soft.  Nontender.  Bowel sounds present and normal.  No audible abdominal bruit.    EXTREMITIES:  No increased edema present.  DP pulses palpable and equal bilaterally.         Assessment & Plan:  CARDIOVASCULAR.  Stable.  Actually improved.  Follow.     HEALTH MAINTENANCE.  Physical last visit.

## 2014-01-08 NOTE — Assessment & Plan Note (Signed)
Increased stress and anxiety.  Has anxiety attacks.  Better on prozac.  Better overall.  Follow.

## 2014-01-08 NOTE — Assessment & Plan Note (Signed)
On prozac and doing better.  Follow.   

## 2014-01-08 NOTE — Assessment & Plan Note (Signed)
Controls with zyrtec.  Follow.    

## 2014-01-08 NOTE — Assessment & Plan Note (Signed)
Chest pain as outlined.  Having chest and arm pain and numbness.  Exam as outlined.  Symptoms reproducible on exam.  Recent stress test negative.  Had NCS.  Negative.  Better with exercise.  Follow.

## 2014-01-17 ENCOUNTER — Telehealth: Payer: Self-pay | Admitting: *Deleted

## 2014-01-17 DIAGNOSIS — F1010 Alcohol abuse, uncomplicated: Secondary | ICD-10-CM

## 2014-01-17 DIAGNOSIS — F32A Depression, unspecified: Secondary | ICD-10-CM

## 2014-01-17 DIAGNOSIS — F329 Major depressive disorder, single episode, unspecified: Secondary | ICD-10-CM

## 2014-01-17 DIAGNOSIS — R079 Chest pain, unspecified: Secondary | ICD-10-CM

## 2014-01-17 DIAGNOSIS — F419 Anxiety disorder, unspecified: Secondary | ICD-10-CM

## 2014-01-17 DIAGNOSIS — K219 Gastro-esophageal reflux disease without esophagitis: Secondary | ICD-10-CM

## 2014-01-17 DIAGNOSIS — Z13220 Encounter for screening for lipoid disorders: Secondary | ICD-10-CM

## 2014-01-17 NOTE — Telephone Encounter (Signed)
Pt is coming in tomorrow what labs and dx?  

## 2014-01-17 NOTE — Telephone Encounter (Signed)
Labs ordered.

## 2014-01-18 ENCOUNTER — Other Ambulatory Visit (INDEPENDENT_AMBULATORY_CARE_PROVIDER_SITE_OTHER): Payer: 59

## 2014-01-18 DIAGNOSIS — K219 Gastro-esophageal reflux disease without esophagitis: Secondary | ICD-10-CM

## 2014-01-18 DIAGNOSIS — Z13220 Encounter for screening for lipoid disorders: Secondary | ICD-10-CM

## 2014-01-18 DIAGNOSIS — F32A Depression, unspecified: Secondary | ICD-10-CM

## 2014-01-18 DIAGNOSIS — F329 Major depressive disorder, single episode, unspecified: Secondary | ICD-10-CM

## 2014-01-18 DIAGNOSIS — F1010 Alcohol abuse, uncomplicated: Secondary | ICD-10-CM

## 2014-01-18 DIAGNOSIS — F3289 Other specified depressive episodes: Secondary | ICD-10-CM

## 2014-01-18 LAB — CBC WITH DIFFERENTIAL/PLATELET
Basophils Absolute: 0 10*3/uL (ref 0.0–0.1)
Basophils Relative: 0.6 % (ref 0.0–3.0)
EOS ABS: 0.3 10*3/uL (ref 0.0–0.7)
Eosinophils Relative: 3.5 % (ref 0.0–5.0)
HEMATOCRIT: 43.4 % (ref 39.0–52.0)
HEMOGLOBIN: 14.4 g/dL (ref 13.0–17.0)
LYMPHS ABS: 2.7 10*3/uL (ref 0.7–4.0)
Lymphocytes Relative: 36.7 % (ref 12.0–46.0)
MCHC: 33.2 g/dL (ref 30.0–36.0)
MCV: 91.1 fl (ref 78.0–100.0)
Monocytes Absolute: 0.6 10*3/uL (ref 0.1–1.0)
Monocytes Relative: 7.8 % (ref 3.0–12.0)
NEUTROS ABS: 3.8 10*3/uL (ref 1.4–7.7)
Neutrophils Relative %: 51.4 % (ref 43.0–77.0)
Platelets: 306 10*3/uL (ref 150.0–400.0)
RBC: 4.76 Mil/uL (ref 4.22–5.81)
RDW: 13.1 % (ref 11.5–15.5)
WBC: 7.4 10*3/uL (ref 4.0–10.5)

## 2014-01-18 LAB — COMPREHENSIVE METABOLIC PANEL
ALK PHOS: 75 U/L (ref 39–117)
ALT: 25 U/L (ref 0–53)
AST: 24 U/L (ref 0–37)
Albumin: 4.3 g/dL (ref 3.5–5.2)
BUN: 8 mg/dL (ref 6–23)
CO2: 32 mEq/L (ref 19–32)
CREATININE: 0.9 mg/dL (ref 0.4–1.5)
Calcium: 9.6 mg/dL (ref 8.4–10.5)
Chloride: 97 mEq/L (ref 96–112)
GFR: 119.36 mL/min (ref >60.00)
Glucose, Bld: 107 mg/dL — ABNORMAL HIGH (ref 70–99)
Potassium: 4.6 mEq/L (ref 3.5–5.1)
SODIUM: 138 meq/L (ref 135–145)
TOTAL PROTEIN: 7.3 g/dL (ref 6.0–8.3)
Total Bilirubin: 1 mg/dL (ref 0.2–1.2)

## 2014-01-18 LAB — LIPID PANEL
Cholesterol: 173 mg/dL (ref 0–200)
HDL: 78.1 mg/dL (ref >39.00)
LDL Cholesterol: 55 mg/dL (ref 0–99)
NonHDL: 94.9
TRIGLYCERIDES: 200 mg/dL — AB (ref 0.0–149.0)
Total CHOL/HDL Ratio: 2
VLDL: 40 mg/dL (ref 0.0–40.0)

## 2014-01-18 LAB — TSH: TSH: 0.58 u[IU]/mL (ref 0.35–4.50)

## 2014-01-18 LAB — VITAMIN B12: Vitamin B-12: 583 pg/mL (ref 211–911)

## 2014-01-19 ENCOUNTER — Encounter: Payer: Self-pay | Admitting: Internal Medicine

## 2014-01-23 NOTE — Telephone Encounter (Signed)
Unread mychart message mailed to patient 

## 2014-01-31 ENCOUNTER — Encounter: Payer: Self-pay | Admitting: Internal Medicine

## 2014-02-06 ENCOUNTER — Other Ambulatory Visit: Payer: Self-pay | Admitting: Internal Medicine

## 2014-02-06 ENCOUNTER — Other Ambulatory Visit: Payer: Self-pay | Admitting: Adult Health

## 2014-02-07 MED ORDER — TRAMADOL HCL 50 MG PO TABS
50.0000 mg | ORAL_TABLET | Freq: Two times a day (BID) | ORAL | Status: DC | PRN
Start: 2014-02-07 — End: 2014-05-17

## 2014-02-07 NOTE — Telephone Encounter (Signed)
Refill

## 2014-02-07 NOTE — Telephone Encounter (Signed)
Request sent in other encounter

## 2014-02-07 NOTE — Telephone Encounter (Signed)
I refilled the tramadol #30 with no refills.  I did not refill phenergan.  I have not been prescribing this.  Not sure why he needs.

## 2014-02-21 MED ORDER — FLUOXETINE HCL 20 MG PO TABS: 20.0000 mg | ORAL_TABLET | Freq: Every day | ORAL | Status: DC

## 2014-02-21 NOTE — Addendum Note (Signed)
Addended by: Chandra Batch E on: 02/21/2014 03:40 PM   Modules accepted: Orders

## 2014-04-07 ENCOUNTER — Ambulatory Visit (INDEPENDENT_AMBULATORY_CARE_PROVIDER_SITE_OTHER): Payer: 59 | Admitting: Internal Medicine

## 2014-04-07 ENCOUNTER — Encounter: Payer: Self-pay | Admitting: Internal Medicine

## 2014-04-07 VITALS — BP 130/80 | HR 64 | Temp 98.4°F | Ht 64.25 in | Wt 171.5 lb

## 2014-04-07 DIAGNOSIS — R400 Somnolence: Secondary | ICD-10-CM | POA: Insufficient documentation

## 2014-04-07 DIAGNOSIS — F32A Depression, unspecified: Secondary | ICD-10-CM

## 2014-04-07 DIAGNOSIS — G4710 Hypersomnia, unspecified: Secondary | ICD-10-CM

## 2014-04-07 DIAGNOSIS — F1010 Alcohol abuse, uncomplicated: Secondary | ICD-10-CM

## 2014-04-07 DIAGNOSIS — R0683 Snoring: Secondary | ICD-10-CM

## 2014-04-07 DIAGNOSIS — K219 Gastro-esophageal reflux disease without esophagitis: Secondary | ICD-10-CM

## 2014-04-07 DIAGNOSIS — F419 Anxiety disorder, unspecified: Secondary | ICD-10-CM

## 2014-04-07 DIAGNOSIS — Z23 Encounter for immunization: Secondary | ICD-10-CM

## 2014-04-07 DIAGNOSIS — Z720 Tobacco use: Secondary | ICD-10-CM

## 2014-04-07 DIAGNOSIS — F329 Major depressive disorder, single episode, unspecified: Secondary | ICD-10-CM

## 2014-04-07 NOTE — Progress Notes (Signed)
Pre visit review using our clinic review tool, if applicable. No additional management support is needed unless otherwise documented below in the visit note. 

## 2014-04-16 ENCOUNTER — Encounter: Payer: Self-pay | Admitting: Internal Medicine

## 2014-04-16 NOTE — Progress Notes (Signed)
Subjective:    Patient ID: Austin SinclairCory E Feild, male    DOB: Nov 23, 1980, 33 y.o.   MRN: [id number]  HPI 33 year old male with past history of bleeding ulcer requiring blood transfusion, alcohol abuse, depression/anxiety, GERD and elevated blood pressures.  He comes in today for a scheduled follow up.   Stress is better.  Overall he feels better.  Is exercising.  MSK complaints better.  Does smoke marijuana to help him relax.  On prozac.  Tolerating the prozac.  No adverse effects.   Breathing overall stable.  Was having some issues with his hand and arm.  See last note for details.  Had NCS.  No abnormality noted.  Felt to be more muscle strain.  Is better with increased exercise. Has stopped drinking again.  Wife is pregnant.  He does question sleep apnea.  Has night sweats.  Wakes himself up at night.  Daytime somnolence.  Witness apnea and snores.      Past Medical History  Diagnosis Date  . Depression   . Ulcer     history of bleeding ulcer requiring blood transfusions  . GERD (gastroesophageal reflux disease)   . Allergy   . Heart murmur   . Hypertension   . Anxiety   . Alcohol abuse     history of    Outpatient Encounter Prescriptions as of 04/07/2014  Medication Sig  . cetirizine (ZYRTEC) 10 MG tablet Take 10 mg by mouth daily.  Marland Kitchen. FLUoxetine (PROZAC) 20 MG tablet Take 1 tablet (20 mg total) by mouth daily.  . fluticasone (FLONASE) 50 MCG/ACT nasal spray Place 2 sprays into both nostrils daily.  . pantoprazole (PROTONIX) 40 MG tablet Take 1 tablet by mouth two  times daily  . promethazine (PHENERGAN) 25 MG tablet Take 1 tablet (25 mg total) by mouth every 8 (eight) hours as needed for nausea or vomiting.  . ranitidine (ZANTAC) 150 MG tablet Take 150 mg by mouth 2 (two) times daily.  . traMADol (ULTRAM) 50 MG tablet Take 1 tablet (50 mg total) by mouth 2 (two) times daily as needed.    Review of Systems Patient denies any headache, lightheadedness or dizziness.   No increased shortness  of breath.  No cough or congestion. No nausea or vomiting.  No abdominal pain or cramping.  On protonix.   No bowel change, such as diarrhea, constipation, BRBPR or melana.  No urine change.    Increased stress and anxiety - better.  Is smoking marijuana.  Helps him relax. Has cut back down on his alcohol.   We had referred him to psych.  He went.  No changes made per his report.  Does not feel he needs to return.  Arm discomfort better.  Work up as outlined.  Overall doing better.  Feels better.  Concern over the possibility of sleep apnea as outlined.   Also reports a rash.  No significant itching.  Just concerned because persistent.  Torso.        Objective:   Physical Exam  Filed Vitals:   04/07/14 1619  BP: 130/80  Pulse: 64  Temp: 98.4 F (2336.629 C)   33 year old male in no acute distress.  HEENT:  Nares - clear.  Oropharynx - without lesions. NECK:  Supple.  Nontender.  No audible carotid bruit.  HEART:  Appears to be regular.   LUNGS:  No crackles or wheezing audible.  Respirations even and unlabored.   RADIAL PULSE:  Equal bilaterally.  ABDOMEN:  Soft.  Nontender.  Bowel sounds present and normal.  No audible abdominal bruit.    EXTREMITIES:  No increased edema present.  DP pulses palpable and equal bilaterally.         Assessment & Plan:  CARDIOVASCULAR.  Stable.  Actually improved.  Follow.     HEALTH MAINTENANCE.  Physical 06/09/13   I spent 25 minutes with the patient and more than 50% of the time was spent in consultation regarding the above.

## 2014-04-16 NOTE — Assessment & Plan Note (Signed)
He has cut back on his alcohol.  Basically has quit.  Follow.

## 2014-04-16 NOTE — Assessment & Plan Note (Signed)
Doing better.  On prozac.  Follow.   

## 2014-04-16 NOTE — Assessment & Plan Note (Signed)
On protonix  No reported symptoms today.  Follow.

## 2014-04-16 NOTE — Assessment & Plan Note (Signed)
Better on prozac.  Better overall.  Follow.

## 2014-04-16 NOTE — Assessment & Plan Note (Signed)
Schedule split night sleep study as outlined.

## 2014-04-16 NOTE — Assessment & Plan Note (Signed)
Have discussed at length with him regarding the need to quit.  He has tried nicotine patches.  Still smoking.  Follow.

## 2014-04-25 ENCOUNTER — Ambulatory Visit: Payer: Self-pay | Admitting: Internal Medicine

## 2014-04-28 ENCOUNTER — Encounter: Payer: Self-pay | Admitting: *Deleted

## 2014-05-12 NOTE — Telephone Encounter (Signed)
Unread mychart message mailed to patient 

## 2014-05-16 ENCOUNTER — Other Ambulatory Visit: Payer: Self-pay | Admitting: Internal Medicine

## 2014-05-17 MED ORDER — FLUOXETINE HCL 20 MG PO TABS
20.0000 mg | ORAL_TABLET | Freq: Every day | ORAL | Status: DC
Start: 2014-05-17 — End: 2014-09-07

## 2014-05-17 MED ORDER — TRAMADOL HCL 50 MG PO TABS: 50.0000 mg | ORAL_TABLET | Freq: Two times a day (BID) | ORAL | Status: DC | PRN

## 2014-05-17 NOTE — Telephone Encounter (Signed)
Ok refill tramadol? Last refill 02/07/14 #30

## 2014-05-17 NOTE — Telephone Encounter (Signed)
Called to pharmacy 

## 2014-05-17 NOTE — Telephone Encounter (Signed)
Refilled tramadol #30 with no refills.   

## 2014-05-22 ENCOUNTER — Encounter: Payer: Self-pay | Admitting: Internal Medicine

## 2014-07-07 ENCOUNTER — Other Ambulatory Visit: Payer: Self-pay | Admitting: Internal Medicine

## 2014-07-07 MED ORDER — TRAMADOL HCL 50 MG PO TABS: 50.0000 mg | ORAL_TABLET | Freq: Every day | ORAL | Status: DC | PRN

## 2014-07-07 NOTE — Telephone Encounter (Signed)
Last visit 04/07/14

## 2014-07-07 NOTE — Telephone Encounter (Signed)
Refilled tramadol #30 with no refills.   

## 2014-07-10 ENCOUNTER — Encounter: Payer: Self-pay | Admitting: Internal Medicine

## 2014-07-10 ENCOUNTER — Ambulatory Visit (INDEPENDENT_AMBULATORY_CARE_PROVIDER_SITE_OTHER): Payer: 59 | Admitting: Internal Medicine

## 2014-07-10 VITALS — BP 110/70 | HR 60 | Temp 97.9°F | Ht 64.25 in | Wt 170.2 lb

## 2014-07-10 DIAGNOSIS — K219 Gastro-esophageal reflux disease without esophagitis: Secondary | ICD-10-CM

## 2014-07-10 DIAGNOSIS — F419 Anxiety disorder, unspecified: Secondary | ICD-10-CM

## 2014-07-10 DIAGNOSIS — Z720 Tobacco use: Secondary | ICD-10-CM

## 2014-07-10 DIAGNOSIS — F1010 Alcohol abuse, uncomplicated: Secondary | ICD-10-CM

## 2014-07-10 MED ORDER — PANTOPRAZOLE SODIUM 40 MG PO TBEC: DELAYED_RELEASE_TABLET | ORAL | Status: DC

## 2014-07-10 NOTE — Progress Notes (Signed)
Pre visit review using our clinic review tool, if applicable. No additional management support is needed unless otherwise documented below in the visit note. 

## 2014-07-10 NOTE — Telephone Encounter (Signed)
Faxed to pharmacy

## 2014-07-13 ENCOUNTER — Ambulatory Visit (INDEPENDENT_AMBULATORY_CARE_PROVIDER_SITE_OTHER): Payer: 59 | Admitting: *Deleted

## 2014-07-13 DIAGNOSIS — Z23 Encounter for immunization: Secondary | ICD-10-CM

## 2014-07-16 ENCOUNTER — Encounter: Payer: Self-pay | Admitting: Internal Medicine

## 2014-07-16 NOTE — Progress Notes (Signed)
Subjective:    Patient ID: Austin [name], male    DOB: 01/20/1981, 34 y.o.   MRN: [id number]  HPI 34 year old male with past history of bleeding ulcer requiring blood transfusion, alcohol abuse, depression/anxiety, GERD and elevated blood pressures.  He comes in today for a scheduled follow up.   Stress is better.  Overall he feels better.  Is exercising.  MSK complaints better.  Does smoke marijuana to help him relax.  On prozac.  Tolerating the prozac.  No adverse effects.   Breathing overall stable.  Was having some issues with his hand and arm.  See previous notes for details.  Had NCS.  No abnormality noted.  Felt to be more muscle strain.  Is better with increased exercise.  Not a problem for him now.   Has stopped drinking again.  Wife is pregnant.  Has stopped smoking cigarettes.  Overall he feels he is doing well.       Past Medical History  Diagnosis Date  . Depression   . Ulcer     history of bleeding ulcer requiring blood transfusions  . GERD (gastroesophageal reflux disease)   . Allergy   . Heart murmur   . Hypertension   . Anxiety   . Alcohol abuse     history of    Outpatient Encounter Prescriptions as of 07/10/2014  Medication Sig  . cetirizine (ZYRTEC) 10 MG tablet Take 10 mg by mouth daily.  Marland Kitchen. FLUoxetine (PROZAC) 20 MG tablet Take 1 tablet (20 mg total) by mouth daily.  . fluticasone (FLONASE) 50 MCG/ACT nasal spray Place 2 sprays into both nostrils daily.  . pantoprazole (PROTONIX) 40 MG tablet Take 1 tablet by mouth two  times daily  . promethazine (PHENERGAN) 25 MG tablet Take 1 tablet (25 mg total) by mouth every 8 (eight) hours as needed for nausea or vomiting.  . ranitidine (ZANTAC) 150 MG tablet Take 150 mg by mouth 2 (two) times daily.  . traMADol (ULTRAM) 50 MG tablet Take 1 tablet (50 mg total) by mouth daily as needed.  . [DISCONTINUED] pantoprazole (PROTONIX) 40 MG tablet Take 1 tablet by mouth two  times daily    Review of Systems Patient denies any  headache, lightheadedness or dizziness.   No increased shortness of breath.  No cough or congestion. No nausea or vomiting.  No abdominal pain or cramping.  On protonix.   No bowel change, such as diarrhea, constipation, BRBPR or melana.  No urine change.    Increased stress and anxiety - better.  Is smoking marijuana.  Helps him relax. Has cut back down on his alcohol.   We had referred him to psych.  He went.  No changes made per his report.  Did not feel he needs to return.  Arm discomfort better.  Work up as outlined.  Not an issue for him now. Overall doing better.  Feels better.        Objective:   Physical Exam  Filed Vitals:   07/10/14 1440  BP: 110/70  Pulse: 60  Temp: 97.9 F (2836.656 C)   34 year old male in no acute distress.  HEENT:  Nares - clear.  Oropharynx - without lesions. NECK:  Supple.  Nontender.  No audible carotid bruit.  HEART:  Appears to be regular.   LUNGS:  No crackles or wheezing audible.  Respirations even and unlabored.   RADIAL PULSE:  Equal bilaterally.  ABDOMEN:  Soft.  Nontender.  Bowel sounds present  and normal.  No audible abdominal bruit.    EXTREMITIES:  No increased edema present.  DP pulses palpable and equal bilaterally.         Assessment & Plan:  1. Anxiety Better.  On prozac.  Follow.    2. Tobacco abuse Has quit smoking.    3. Alcohol abuse Discussed the need to quit.  Not drinking now.    4. Gastroesophageal reflux disease, esophagitis presence not specified Controlled on protonix.  Follow.   5. CARDIOVASCULAR.  Stable.  Actually improved.  Follow.     HEALTH MAINTENANCE.  Physical 06/09/13.  Schedule him for a physical.

## 2014-07-25 ENCOUNTER — Encounter: Payer: Self-pay | Admitting: Internal Medicine

## 2014-07-25 MED ORDER — PANTOPRAZOLE SODIUM 40 MG PO TBEC: DELAYED_RELEASE_TABLET | ORAL | Status: DC

## 2014-09-06 ENCOUNTER — Other Ambulatory Visit: Payer: Self-pay | Admitting: Internal Medicine

## 2014-09-07 ENCOUNTER — Other Ambulatory Visit: Payer: Self-pay | Admitting: *Deleted

## 2014-09-07 MED ORDER — TRAMADOL HCL 50 MG PO TABS: 50.0000 mg | ORAL_TABLET | Freq: Every day | ORAL | Status: DC | PRN

## 2014-09-07 MED ORDER — FLUOXETINE HCL 20 MG PO TABS: 20.0000 mg | ORAL_TABLET | Freq: Every day | ORAL | Status: DC

## 2014-09-07 NOTE — Telephone Encounter (Signed)
Refilled prozac #90 with no refills.  Refilled tramadol x 1

## 2014-09-07 NOTE — Telephone Encounter (Signed)
Tramadol faxed.

## 2014-10-09 ENCOUNTER — Ambulatory Visit (INDEPENDENT_AMBULATORY_CARE_PROVIDER_SITE_OTHER): Payer: 59 | Admitting: Internal Medicine

## 2014-10-09 ENCOUNTER — Encounter: Payer: Self-pay | Admitting: Internal Medicine

## 2014-10-09 VITALS — BP 122/80 | HR 64 | Temp 98.6°F | Resp 14 | Ht 64.25 in | Wt 171.2 lb

## 2014-10-09 DIAGNOSIS — K219 Gastro-esophageal reflux disease without esophagitis: Secondary | ICD-10-CM

## 2014-10-09 DIAGNOSIS — F329 Major depressive disorder, single episode, unspecified: Secondary | ICD-10-CM

## 2014-10-09 DIAGNOSIS — Z720 Tobacco use: Secondary | ICD-10-CM | POA: Diagnosis not present

## 2014-10-09 DIAGNOSIS — F1010 Alcohol abuse, uncomplicated: Secondary | ICD-10-CM

## 2014-10-09 DIAGNOSIS — F419 Anxiety disorder, unspecified: Secondary | ICD-10-CM | POA: Diagnosis not present

## 2014-10-09 DIAGNOSIS — F32A Depression, unspecified: Secondary | ICD-10-CM

## 2014-10-09 NOTE — Progress Notes (Signed)
Patient ID: Austin SinclairCory E Montee, male   DOB: 1981-02-19, 34 y.o.   MRN: [id number]   Subjective:    Patient ID: Austin [name], male    DOB: 1981-02-19, 34 y.o.   MRN: [id number]  HPI  Patient here for his physical exam.  States he is doing relatively well.  Daughter born 08/10/14.  Doing well.  Some increased stress related to this.  Feels he is handling things relatively well.  Not smoking.  Has cut down alcohol significantly.  Still smoking marijuana.  Neck/arms/hands better. Exercises help.  No cardiac symptoms with increased activity or exertion.  Breathing stable.  Bowels stable.     Past Medical History  Diagnosis Date  . Depression   . Ulcer     history of bleeding ulcer requiring blood transfusions  . GERD (gastroesophageal reflux disease)   . Allergy   . Heart murmur   . Hypertension   . Anxiety   . Alcohol abuse     history of    Current Outpatient Prescriptions on File Prior to Visit  Medication Sig Dispense Refill  . cetirizine (ZYRTEC) 10 MG tablet Take 10 mg by mouth daily.    . fluticasone (FLONASE) 50 MCG/ACT nasal spray Place 2 sprays into both nostrils daily. 16 g 2  . pantoprazole (PROTONIX) 40 MG tablet Take 1 tablet by mouth two  times daily 180 tablet 1  . promethazine (PHENERGAN) 25 MG tablet Take 1 tablet (25 mg total) by mouth every 8 (eight) hours as needed for nausea or vomiting. 20 tablet 0  . ranitidine (ZANTAC) 150 MG tablet Take 150 mg by mouth 2 (two) times daily.    . traMADol (ULTRAM) 50 MG tablet Take 1 tablet (50 mg total) by mouth daily as needed. 30 tablet 0   No current facility-administered medications on file prior to visit.    Review of Systems  Constitutional: Negative for appetite change and unexpected weight change.  HENT: Negative for congestion and sinus pressure.   Eyes: Negative for pain and visual disturbance.  Respiratory: Negative for cough, chest tightness and shortness of breath.   Cardiovascular: Negative for chest pain,  palpitations and leg swelling.  Gastrointestinal: Negative for nausea, vomiting, abdominal pain and diarrhea.  Genitourinary: Negative for dysuria and difficulty urinating.  Musculoskeletal: Negative for back pain and joint swelling.       Neck/arm/hand pains - improved.    Skin: Negative for color change and rash.  Neurological: Negative for dizziness, light-headedness and headaches.  Hematological: Negative for adenopathy. Does not bruise/bleed easily.  Psychiatric/Behavioral: Negative for dysphoric mood and agitation.       Objective:     Blood pressure recheck:  128/78  Physical Exam  Constitutional: He is oriented to person, place, and time. He appears well-developed and well-nourished. No distress.  HENT:  Head: Normocephalic and atraumatic.  Nose: Nose normal.  Mouth/Throat: Oropharynx is clear and moist. No oropharyngeal exudate.  Eyes: Conjunctivae are normal. Right eye exhibits no discharge. Left eye exhibits no discharge.  Neck: Neck supple. No thyromegaly present.  Cardiovascular: Normal rate and regular rhythm.   Pulmonary/Chest: Breath sounds normal. No respiratory distress. He has no wheezes.  Abdominal: Soft. Bowel sounds are normal. There is no tenderness.  Genitourinary:  Normal descended testicles.  No palpable nodules present.   Musculoskeletal: He exhibits no edema or tenderness.  Lymphadenopathy:    He has no cervical adenopathy.  Neurological: He is alert and oriented to person, place, and time.  Skin:  Skin is warm and dry. No rash noted.  Psychiatric: He has a normal mood and affect. His behavior is normal.    BP 122/80 mmHg  Pulse 64  Temp(Src) 98.6 F (37 C) (Oral)  Resp 14  Ht 5' 4.25" (1.632 m)  Wt 171 lb 4 oz (77.678 kg)  BMI 29.16 kg/m2  SpO2 99% Wt Readings from Last 3 Encounters:  10/09/14 171 lb 4 oz (77.678 kg)  07/10/14 170 lb 4 oz (77.225 kg)  04/07/14 171 lb 8 oz (77.792 kg)     Lab Results  Component Value Date   WBC 7.4  01/18/2014   HGB 14.4 01/18/2014   HCT 43.4 01/18/2014   PLT 306.0 01/18/2014   GLUCOSE 107* 01/18/2014   CHOL 173 01/18/2014   TRIG 200.0* 01/18/2014   HDL 78.10 01/18/2014   LDLCALC 55 01/18/2014   ALT 25 01/18/2014   AST 24 01/18/2014   NA 138 01/18/2014   K 4.6 01/18/2014   CL 97 01/18/2014   CREATININE 0.9 01/18/2014   BUN 8 01/18/2014   CO2 32 01/18/2014   TSH 0.58 01/18/2014   HGBA1C 6.1 01/26/2013       Assessment & Plan:   Problem List Items Addressed This Visit    Alcohol abuse    Has cut down significantly his alcohol intake.  Rarely drinks now.  Follow.       Anxiety    Doing well on prozac.  Follow.       Depression    Doing well on prozac.  Follow.       GERD (gastroesophageal reflux disease) - Primary    Symptoms controlled.  Follow.       Tobacco abuse    No longer smoking.  Follow.            [place], MD

## 2014-10-09 NOTE — Progress Notes (Signed)
Pre visit review using our clinic review tool, if applicable. No additional management support is needed unless otherwise documented below in the visit note. 

## 2014-10-10 ENCOUNTER — Encounter: Payer: Self-pay | Admitting: Internal Medicine

## 2014-10-10 NOTE — Assessment & Plan Note (Signed)
Doing well on prozac.  Follow  

## 2014-10-10 NOTE — Assessment & Plan Note (Signed)
Has cut down significantly his alcohol intake.  Rarely drinks now.  Follow.

## 2014-10-10 NOTE — Assessment & Plan Note (Signed)
Symptoms controlled.  Follow.   

## 2014-10-10 NOTE — Assessment & Plan Note (Signed)
No longer smoking.  Follow.

## 2014-10-13 ENCOUNTER — Other Ambulatory Visit: Payer: Self-pay | Admitting: Internal Medicine

## 2014-10-14 ENCOUNTER — Other Ambulatory Visit: Payer: Self-pay | Admitting: Internal Medicine

## 2014-10-14 ENCOUNTER — Telehealth: Payer: Self-pay | Admitting: Internal Medicine

## 2014-10-14 MED ORDER — FLUOXETINE HCL 20 MG PO TABS: 20.0000 mg | ORAL_TABLET | Freq: Every day | ORAL | Status: DC

## 2014-10-14 NOTE — Progress Notes (Signed)
Refilled prozac #90 with one refill.   

## 2014-10-14 NOTE — Telephone Encounter (Signed)
See how often pt is taking tramadol.  Given on prozac, would like to get him off tramadol.  There can be an interaction with these medications.  I know he has not been taking the tramadol on a regular basis, but I would like to see if we can stop it all together .

## 2014-10-15 ENCOUNTER — Encounter: Payer: Self-pay | Admitting: Internal Medicine

## 2014-10-15 NOTE — Assessment & Plan Note (Signed)
Doing well on prozac.  Follow  

## 2014-11-08 ENCOUNTER — Telehealth: Payer: Self-pay | Admitting: *Deleted

## 2014-11-08 ENCOUNTER — Other Ambulatory Visit (INDEPENDENT_AMBULATORY_CARE_PROVIDER_SITE_OTHER): Payer: 59

## 2014-11-08 DIAGNOSIS — E7800 Pure hypercholesterolemia, unspecified: Secondary | ICD-10-CM

## 2014-11-08 DIAGNOSIS — R739 Hyperglycemia, unspecified: Secondary | ICD-10-CM

## 2014-11-08 NOTE — Telephone Encounter (Signed)
Orders placed.

## 2014-11-08 NOTE — Telephone Encounter (Signed)
Labs and dx?  

## 2014-11-09 ENCOUNTER — Encounter: Payer: Self-pay | Admitting: Internal Medicine

## 2014-11-09 LAB — COMPREHENSIVE METABOLIC PANEL
ALT: 15 U/L (ref 0–53)
AST: 17 U/L (ref 0–37)
Albumin: 4.6 g/dL (ref 3.5–5.2)
Alkaline Phosphatase: 73 U/L (ref 39–117)
BILIRUBIN TOTAL: 0.4 mg/dL (ref 0.2–1.2)
BUN: 7 mg/dL (ref 6–23)
CO2: 31 meq/L (ref 19–32)
Calcium: 9.7 mg/dL (ref 8.4–10.5)
Chloride: 101 mEq/L (ref 96–112)
Creatinine, Ser: 0.93 mg/dL (ref 0.40–1.50)
GFR: 120.24 mL/min (ref >60.00)
Glucose, Bld: 82 mg/dL (ref 70–99)
Potassium: 4.1 mEq/L (ref 3.5–5.1)
Sodium: 138 mEq/L (ref 135–145)
Total Protein: 7.4 g/dL (ref 6.0–8.3)

## 2014-11-09 LAB — LIPID PANEL
CHOLESTEROL: 173 mg/dL (ref 0–200)
HDL: 70.4 mg/dL (ref >39.00)
LDL Cholesterol: 86 mg/dL (ref 0–99)
NonHDL: 102.6
Total CHOL/HDL Ratio: 2
Triglycerides: 84 mg/dL (ref 0.0–149.0)
VLDL: 16.8 mg/dL (ref 0.0–40.0)

## 2014-11-09 LAB — HEMOGLOBIN A1C: HEMOGLOBIN A1C: 5.8 % (ref 4.6–6.5)

## 2014-11-13 NOTE — Telephone Encounter (Signed)
Unread mychart message mailed to patient 

## 2014-11-29 ENCOUNTER — Other Ambulatory Visit: Payer: Self-pay | Admitting: Internal Medicine

## 2014-11-29 ENCOUNTER — Encounter: Payer: Self-pay | Admitting: Internal Medicine

## 2014-12-04 ENCOUNTER — Encounter: Payer: Self-pay | Admitting: Internal Medicine

## 2014-12-04 NOTE — Telephone Encounter (Signed)
Ok to change to capsule, see mychart msg

## 2014-12-05 MED ORDER — FLUOXETINE HCL 20 MG PO CAPS: 20.0000 mg | ORAL_CAPSULE | Freq: Every day | ORAL | Status: DC

## 2014-12-05 NOTE — Telephone Encounter (Signed)
Ok to change to capsule and send rx to [facility].

## 2015-02-08 ENCOUNTER — Ambulatory Visit (INDEPENDENT_AMBULATORY_CARE_PROVIDER_SITE_OTHER): Payer: 59 | Admitting: Internal Medicine

## 2015-02-08 ENCOUNTER — Encounter: Payer: Self-pay | Admitting: Internal Medicine

## 2015-02-08 VITALS — BP 132/78 | HR 71 | Temp 98.7°F | Ht 64.25 in | Wt 176.4 lb

## 2015-02-08 DIAGNOSIS — F419 Anxiety disorder, unspecified: Secondary | ICD-10-CM

## 2015-02-08 DIAGNOSIS — F32A Depression, unspecified: Secondary | ICD-10-CM

## 2015-02-08 DIAGNOSIS — K219 Gastro-esophageal reflux disease without esophagitis: Secondary | ICD-10-CM

## 2015-02-08 DIAGNOSIS — F1099 Alcohol use, unspecified with unspecified alcohol-induced disorder: Secondary | ICD-10-CM | POA: Diagnosis not present

## 2015-02-08 DIAGNOSIS — F329 Major depressive disorder, single episode, unspecified: Secondary | ICD-10-CM

## 2015-02-08 DIAGNOSIS — Z789 Other specified health status: Secondary | ICD-10-CM

## 2015-02-08 DIAGNOSIS — Z7289 Other problems related to lifestyle: Secondary | ICD-10-CM

## 2015-02-08 MED ORDER — PANTOPRAZOLE SODIUM 40 MG PO TBEC: DELAYED_RELEASE_TABLET | ORAL | Status: DC

## 2015-02-08 MED ORDER — FLUOXETINE HCL 40 MG PO CAPS: 40.0000 mg | ORAL_CAPSULE | Freq: Every day | ORAL | Status: DC

## 2015-02-08 NOTE — Progress Notes (Signed)
Patient ID: Austin [name], male   DOB: 11-04-1980, 34 y.o.   MRN: [id number]   Subjective:    Patient ID: Austin [name], male    DOB: 05/23/1981, 34 y.o.   MRN: [id number]  HPI  Patient here for a scheduled follow up.  Increased stress - work and family issues.  Discussed his alcohol intake.  Is drinking some.  No a large amount.  Discussed stopping.  He agrees.  Still smokes marijuana.  Does this instead of drinking.  Feels needs to increase the prozac.  No suicidal ideations.  Does feel down at times.  Feels stressed at times.  Stays active.  No cardiac symptoms with increased activity or exertion.  No cardiac symptoms with increased activity or exertion.  No sob.  Bowels stable.  Eating and drinking well.     Past Medical History  Diagnosis Date  . Depression   . Ulcer     history of bleeding ulcer requiring blood transfusions  . GERD (gastroesophageal reflux disease)   . Allergy   . Heart murmur   . Hypertension   . Anxiety   . Alcohol abuse     history of    Family history and social history reviewed.     Outpatient Encounter Prescriptions as of 02/08/2015  Medication Sig  . cetirizine (ZYRTEC) 10 MG tablet Take 10 mg by mouth daily.  . fluticasone (FLONASE) 50 MCG/ACT nasal spray Place 2 sprays into both nostrils daily.  . pantoprazole (PROTONIX) 40 MG tablet Take 1 tablet by mouth two  times daily  . ranitidine (ZANTAC) 150 MG tablet Take 150 mg by mouth 2 (two) times daily.  . [DISCONTINUED] FLUoxetine (PROZAC) 20 MG capsule Take 1 capsule (20 mg total) by mouth daily.  . [DISCONTINUED] pantoprazole (PROTONIX) 40 MG tablet Take 1 tablet by mouth two  times daily  . FLUoxetine (PROZAC) 40 MG capsule Take 1 capsule (40 mg total) by mouth daily.  . [DISCONTINUED] promethazine (PHENERGAN) 25 MG tablet Take 1 tablet (25 mg total) by mouth every 8 (eight) hours as needed for nausea or vomiting.  . [DISCONTINUED] traMADol (ULTRAM) 50 MG tablet Take 1 tablet (50 mg total) by mouth  daily as needed. (Patient not taking: Reported on 02/08/2015)   No facility-administered encounter medications on file as of 02/08/2015.    Review of Systems  Constitutional: Negative for appetite change and unexpected weight change.  HENT: Negative for congestion and sinus pressure.   Respiratory: Negative for cough, chest tightness and shortness of breath.   Cardiovascular: Negative for chest pain, palpitations and leg swelling.  Gastrointestinal: Negative for nausea, vomiting, abdominal pain and diarrhea.  Genitourinary: Negative for dysuria and difficulty urinating.  Musculoskeletal: Negative for back pain and joint swelling.  Skin: Negative for color change and rash.  Neurological: Negative for dizziness, light-headedness and headaches.  Hematological: Negative for adenopathy. Does not bruise/bleed easily.  Psychiatric/Behavioral: Negative for dysphoric mood and agitation.       Objective:     Blood pressure rechecked by me:  132/78  Physical Exam  Constitutional: He appears well-developed and well-nourished. No distress.  HENT:  Nose: Nose normal.  Mouth/Throat: Oropharynx is clear and moist.  Neck: Neck supple.  Cardiovascular: Normal rate and regular rhythm.   Pulmonary/Chest: Effort normal and breath sounds normal. No respiratory distress.  Abdominal: Soft. Bowel sounds are normal. There is no tenderness.  Musculoskeletal: He exhibits no edema or tenderness.  Lymphadenopathy:    He has no cervical adenopathy.  Skin: No rash noted. No erythema.  Psychiatric: He has a normal mood and affect. His behavior is normal.    BP 132/78 mmHg  Pulse 71  Temp(Src) 98.7 F (37.1 C) (Oral)  Ht 5' 4.25" (1.632 m)  Wt 176 lb 6.4 oz (80.015 kg)  BMI 30.04 kg/m2  SpO2 97% Wt Readings from Last 3 Encounters:  02/08/15 176 lb 6.4 oz (80.015 kg)  10/09/14 171 lb 4 oz (77.678 kg)  07/10/14 170 lb 4 oz (77.225 kg)     Lab Results  Component Value Date   WBC 7.4 01/18/2014    HGB 14.4 01/18/2014   HCT 43.4 01/18/2014   PLT 306.0 01/18/2014   GLUCOSE 82 11/08/2014   CHOL 173 11/08/2014   TRIG 84.0 11/08/2014   HDL 70.40 11/08/2014   LDLCALC 86 11/08/2014   ALT 15 11/08/2014   AST 17 11/08/2014   NA 138 11/08/2014   K 4.1 11/08/2014   CL 101 11/08/2014   CREATININE 0.93 11/08/2014   BUN 7 11/08/2014   CO2 31 11/08/2014   TSH 0.58 01/18/2014   HGBA1C 5.8 11/08/2014       Assessment & Plan:   Problem List Items Addressed This Visit    Alcohol use    Discussed the need to quit.   Discussed interaction with prozac.        Anxiety    Increased stress and anxiety as outlined.  No suicidal ideations.  Increase prozac to 40mg  q day.  Stop alcohol.  Discussed counseling.  Information given.  No suicidal ideations.  Follow.        Relevant Medications   FLUoxetine (PROZAC) 40 MG capsule   Depression    See above.  Increase prozac as outlined.  Follow.       Relevant Medications   FLUoxetine (PROZAC) 40 MG capsule   GERD (gastroesophageal reflux disease) - Primary    Symptoms controlled.  On protonix.       Relevant Medications   pantoprazole (PROTONIX) 40 MG tablet       Dale [place], MD

## 2015-02-08 NOTE — Progress Notes (Signed)
Pre visit review using our clinic review tool, if applicable. No additional management support is needed unless otherwise documented below in the visit note. 

## 2015-02-10 ENCOUNTER — Encounter: Payer: Self-pay | Admitting: Internal Medicine

## 2015-02-10 DIAGNOSIS — Z7289 Other problems related to lifestyle: Secondary | ICD-10-CM | POA: Insufficient documentation

## 2015-02-10 DIAGNOSIS — Z789 Other specified health status: Secondary | ICD-10-CM | POA: Insufficient documentation

## 2015-02-10 NOTE — Assessment & Plan Note (Signed)
See above.  Increase prozac as outlined.  Follow.

## 2015-02-10 NOTE — Assessment & Plan Note (Signed)
Increased stress and anxiety as outlined.  No suicidal ideations.  Increase prozac to  q day.  Stop alcohol.  Discussed counseling.  Information given.  No suicidal ideations.  Follow.

## 2015-02-10 NOTE — Assessment & Plan Note (Signed)
Discussed the need to quit.   Discussed interaction with prozac.

## 2015-02-10 NOTE — Assessment & Plan Note (Signed)
Symptoms controlled.  On protonix.   

## 2015-04-09 ENCOUNTER — Ambulatory Visit (INDEPENDENT_AMBULATORY_CARE_PROVIDER_SITE_OTHER): Payer: 59 | Admitting: Internal Medicine

## 2015-04-09 ENCOUNTER — Encounter: Payer: Self-pay | Admitting: Internal Medicine

## 2015-04-09 VITALS — BP 130/80 | HR 92 | Temp 98.4°F | Resp 18 | Ht 64.25 in | Wt 175.5 lb

## 2015-04-09 DIAGNOSIS — F419 Anxiety disorder, unspecified: Secondary | ICD-10-CM | POA: Diagnosis not present

## 2015-04-09 DIAGNOSIS — M79606 Pain in leg, unspecified: Secondary | ICD-10-CM

## 2015-04-09 DIAGNOSIS — Z720 Tobacco use: Secondary | ICD-10-CM

## 2015-04-09 DIAGNOSIS — F32A Depression, unspecified: Secondary | ICD-10-CM

## 2015-04-09 DIAGNOSIS — F1010 Alcohol abuse, uncomplicated: Secondary | ICD-10-CM

## 2015-04-09 DIAGNOSIS — Z23 Encounter for immunization: Secondary | ICD-10-CM | POA: Diagnosis not present

## 2015-04-09 DIAGNOSIS — K259 Gastric ulcer, unspecified as acute or chronic, without hemorrhage or perforation: Secondary | ICD-10-CM

## 2015-04-09 DIAGNOSIS — K219 Gastro-esophageal reflux disease without esophagitis: Secondary | ICD-10-CM

## 2015-04-09 DIAGNOSIS — F329 Major depressive disorder, single episode, unspecified: Secondary | ICD-10-CM

## 2015-04-09 MED ORDER — VARENICLINE TARTRATE 0.5 MG X 11 & 1 MG X 42 PO MISC: ORAL | Status: DC

## 2015-04-09 MED ORDER — MELOXICAM 7.5 MG PO TABS: 7.5000 mg | ORAL_TABLET | Freq: Every day | ORAL | Status: DC

## 2015-04-09 MED ORDER — VARENICLINE TARTRATE 1 MG PO TABS: 1.0000 mg | ORAL_TABLET | Freq: Two times a day (BID) | ORAL | Status: DC

## 2015-04-09 NOTE — Progress Notes (Signed)
Pre-visit discussion using our clinic review tool. No additional management support is needed unless otherwise documented below in the visit note.  

## 2015-04-09 NOTE — Patient Instructions (Signed)

## 2015-04-09 NOTE — Progress Notes (Signed)
Patient ID: Austin [name], male   DOB: 1980/09/03, 34 y.o.   MRN: [id number]   Subjective:    Patient ID: Austin [name], male    DOB: 11/29/1980, 34 y.o.   MRN: [id number]  HPI  Patient with past history of GERD, tobacco abuse and previous alcohol abuse.  He comes in today to follow up on these issues.  He has been playing soccer recently.  He reports that he has been having increased pain in his knees and ankles.  Describes joint pains.  No muscle aching.  Needs something more than tylenol.  Taking 6 tylenol per day.  Has been doing stretches and icing.  Also, wants to quit smoking.  Discussed treatment options.  Has taken chantix previously and tolerated.  Is not drinking alcohol now. No chest pain or tightness.  No sob.  No acid reflux.  No abdominal pain or cramping.  Bowels stable.     Past Medical History  Diagnosis Date  . Depression   . Ulcer     history of bleeding ulcer requiring blood transfusions  . GERD (gastroesophageal reflux disease)   . Allergy   . Heart murmur   . Hypertension   . Anxiety   . Alcohol abuse     history of   Past Surgical History  Procedure Laterality Date  . Tonsillectomy and adenoidectomy  1992  . Knee surgery      Left knee - ACL   Family History  Problem Relation Age of Onset  . Alcohol abuse Father   . Cancer Father     prostate cancer  . Arthritis Sister   . Cancer Maternal Grandmother     breast cancer  . Stroke Maternal Grandmother   . Diabetes Maternal Grandmother   . Hypertension Maternal Grandmother    Social History   Social History  . Marital Status: Married    Spouse Name: N/A  . Number of Children: N/A  . Years of Education: N/A   Social History Main Topics  . Smoking status: Former Smoker -- 0.50 packs/day    Types: Cigarettes  . Smokeless tobacco: Never Used     Comment: Quit smoking recently  . Alcohol Use: 0.0 oz/week    0 Standard drinks or equivalent per week  . Drug Use: Yes     Comment: marijuana  . Sexual  Activity: Not Asked   Other Topics Concern  . None   Social History Narrative    Outpatient Encounter Prescriptions as of 04/09/2015  Medication Sig  . cetirizine (ZYRTEC) 10 MG tablet Take 10 mg by mouth daily.  Marland Kitchen FLUoxetine (PROZAC) 40 MG capsule Take 1 capsule (40 mg total) by mouth daily.  . fluticasone (FLONASE) 50 MCG/ACT nasal spray Place 2 sprays into both nostrils daily.  . pantoprazole (PROTONIX) 40 MG tablet Take 1 tablet by mouth two  times daily  . ranitidine (ZANTAC) 150 MG tablet Take 150 mg by mouth 2 (two) times daily.  . meloxicam (MOBIC) 7.5 MG tablet Take 1 tablet (7.5 mg total) by mouth daily.  . varenicline (CHANTIX CONTINUING MONTH PAK) 1 MG tablet Take 1 tablet (1 mg total) by mouth 2 (two) times daily.  . varenicline (CHANTIX STARTING MONTH PAK) 0.5 MG X 11 & 1 MG X 42 tablet Take one 0.5 mg tablet by mouth once daily for 3 days, then increase to one 0.5 mg tablet twice daily for 4 days, then increase to one 1 mg tablet twice daily.   No  facility-administered encounter medications on file as of 04/09/2015.    Review of Systems  Constitutional: Negative for appetite change and unexpected weight change.  HENT: Negative for congestion and sinus pressure.   Eyes: Negative for discharge and visual disturbance.  Respiratory: Negative for cough, chest tightness and shortness of breath.   Cardiovascular: Negative for chest pain, palpitations and leg swelling.  Gastrointestinal: Negative for nausea, vomiting and abdominal pain.  Genitourinary: Negative for dysuria and difficulty urinating.  Musculoskeletal: Negative for back pain and joint swelling.  Skin: Negative for color change and rash.  Neurological: Negative for dizziness, light-headedness and headaches.  Psychiatric/Behavioral: Negative for dysphoric mood and agitation.       Objective:    Physical Exam  Constitutional: He appears well-developed and well-nourished. No distress.  HENT:  Nose: Nose  normal.  Mouth/Throat: Oropharynx is clear and moist.  Eyes: Conjunctivae are normal. Right eye exhibits no discharge. Left eye exhibits no discharge.  Neck: Neck supple. No thyromegaly present.  Cardiovascular: Normal rate and regular rhythm.   Pulmonary/Chest: Effort normal and breath sounds normal. No respiratory distress.  Abdominal: Soft. Bowel sounds are normal. There is no tenderness.  Musculoskeletal: He exhibits no edema or tenderness.  No pain with straight leg raise.  Motor strength appears to be equal bilateral lower extremities.    Lymphadenopathy:    He has no cervical adenopathy.  Skin: No rash noted. No erythema.  Psychiatric: He has a normal mood and affect. His behavior is normal.    BP 130/80 mmHg  Pulse 92  Temp(Src) 98.4 F (36.9 C) (Oral)  Resp 18  Ht 5' 4.25" (1.632 m)  Wt 175 lb 8 oz (79.606 kg)  BMI 29.89 kg/m2  SpO2 97% Wt Readings from Last 3 Encounters:  04/09/15 175 lb 8 oz (79.606 kg)  02/08/15 176 lb 6.4 oz (80.015 kg)  10/09/14 171 lb 4 oz (77.678 kg)     Lab Results  Component Value Date   WBC 7.4 01/18/2014   HGB 14.4 01/18/2014   HCT 43.4 01/18/2014   PLT 306.0 01/18/2014   GLUCOSE 82 11/08/2014   CHOL 173 11/08/2014   TRIG 84.0 11/08/2014   HDL 70.40 11/08/2014   LDLCALC 86 11/08/2014   ALT 15 11/08/2014   AST 17 11/08/2014   NA 138 11/08/2014   K 4.1 11/08/2014   CL 101 11/08/2014   CREATININE 0.93 11/08/2014   BUN 7 11/08/2014   CO2 31 11/08/2014   TSH 0.58 01/18/2014   HGBA1C 5.8 11/08/2014       Assessment & Plan:   Problem List Items Addressed This Visit    Alcohol abuse    Rarely drinking now.  Follow.        Anxiety    On increased prozac  q day.  Stable.  Follow.        Depression    On prozac  q day.  Follow.  Stable.       Relevant Orders   TSH   Gastric ulcer    On protonix.  Short trial of mobic.  Will have to monitor symptoms.  Follow.        GERD (gastroesophageal reflux disease)     Symptoms controlled on protonix.  Follow.  Will have to monitor while on mobic.        Relevant Orders   CBC with Differential/Platelet   Hepatic function panel   Basic metabolic panel   Leg pain    Pain as outlined.  Stretches.  meloxiam as directed.  Check inflammatory marker and routine labs.        Relevant Orders   Sedimentation rate   Rheumatoid factor   CK (Creatine Kinase)   Tobacco abuse    Discussed with him regarding treatment options.  Start chantix.  Discussed possible side effects and risk of medication.  Follow.        Relevant Medications   varenicline (CHANTIX STARTING MONTH PAK) 0.5 MG X 11 & 1 MG X 42 tablet   varenicline (CHANTIX CONTINUING MONTH PAK) 1 MG tablet    Other Visit Diagnoses    Encounter for immunization    -  Primary        Dale [place], MD

## 2015-04-15 ENCOUNTER — Encounter: Payer: Self-pay | Admitting: Internal Medicine

## 2015-04-15 DIAGNOSIS — M79606 Pain in leg, unspecified: Secondary | ICD-10-CM | POA: Insufficient documentation

## 2015-04-15 NOTE — Assessment & Plan Note (Signed)
Discussed with him regarding treatment options.  Start chantix.  Discussed possible side effects and risk of medication.  Follow.

## 2015-04-15 NOTE — Assessment & Plan Note (Signed)
On increased prozac 40mg  q day.  Stable.  Follow.

## 2015-04-15 NOTE — Assessment & Plan Note (Signed)
Rarely drinking now.  Follow.

## 2015-04-15 NOTE — Assessment & Plan Note (Signed)
On protonix.  Short trial of mobic.  Will have to monitor symptoms.  Follow.

## 2015-04-15 NOTE — Assessment & Plan Note (Signed)
On prozac 40mg  q day.  Follow.  Stable.

## 2015-04-15 NOTE — Assessment & Plan Note (Signed)
Symptoms controlled on protonix.  Follow.  Will have to monitor while on mobic.

## 2015-04-15 NOTE — Assessment & Plan Note (Signed)
Pain as outlined.  Stretches.  meloxiam as directed.  Check inflammatory marker and routine labs.

## 2015-04-16 ENCOUNTER — Other Ambulatory Visit: Payer: 59

## 2015-04-20 ENCOUNTER — Other Ambulatory Visit (INDEPENDENT_AMBULATORY_CARE_PROVIDER_SITE_OTHER): Payer: 59

## 2015-04-20 DIAGNOSIS — M79606 Pain in leg, unspecified: Secondary | ICD-10-CM

## 2015-04-20 DIAGNOSIS — F329 Major depressive disorder, single episode, unspecified: Secondary | ICD-10-CM | POA: Diagnosis not present

## 2015-04-20 DIAGNOSIS — K219 Gastro-esophageal reflux disease without esophagitis: Secondary | ICD-10-CM

## 2015-04-20 DIAGNOSIS — F32A Depression, unspecified: Secondary | ICD-10-CM

## 2015-04-20 LAB — CBC WITH DIFFERENTIAL/PLATELET
BASOS PCT: 0.5 % (ref 0.0–3.0)
Basophils Absolute: 0 10*3/uL (ref 0.0–0.1)
EOS ABS: 0.3 10*3/uL (ref 0.0–0.7)
EOS PCT: 5.7 % — AB (ref 0.0–5.0)
HEMATOCRIT: 39.1 % (ref 39.0–52.0)
Hemoglobin: 12.8 g/dL — ABNORMAL LOW (ref 13.0–17.0)
LYMPHS PCT: 32.5 % (ref 12.0–46.0)
Lymphs Abs: 2 10*3/uL (ref 0.7–4.0)
MCHC: 32.7 g/dL (ref 30.0–36.0)
MCV: 88.9 fl (ref 78.0–100.0)
MONO ABS: 0.5 10*3/uL (ref 0.1–1.0)
Monocytes Relative: 8.4 % (ref 3.0–12.0)
NEUTROS ABS: 3.2 10*3/uL (ref 1.4–7.7)
Neutrophils Relative %: 52.9 % (ref 43.0–77.0)
PLATELETS: 292 10*3/uL (ref 150.0–400.0)
RBC: 4.4 Mil/uL (ref 4.22–5.81)
RDW: 13.9 % (ref 11.5–15.5)
WBC: 6 10*3/uL (ref 4.0–10.5)

## 2015-04-20 LAB — HEPATIC FUNCTION PANEL
ALBUMIN: 4.2 g/dL (ref 3.5–5.2)
ALT: 22 U/L (ref 0–53)
AST: 23 U/L (ref 0–37)
Alkaline Phosphatase: 88 U/L (ref 39–117)
BILIRUBIN TOTAL: 0.6 mg/dL (ref 0.2–1.2)
Bilirubin, Direct: 0.1 mg/dL (ref 0.0–0.3)
Total Protein: 6.7 g/dL (ref 6.0–8.3)

## 2015-04-20 LAB — BASIC METABOLIC PANEL
BUN: 8 mg/dL (ref 6–23)
CALCIUM: 8.9 mg/dL (ref 8.4–10.5)
CO2: 31 mEq/L (ref 19–32)
Chloride: 103 mEq/L (ref 96–112)
Creatinine, Ser: 0.94 mg/dL (ref 0.40–1.50)
GFR: 118.44 mL/min (ref >60.00)
GLUCOSE: 81 mg/dL (ref 70–99)
POTASSIUM: 4.3 meq/L (ref 3.5–5.1)
SODIUM: 140 meq/L (ref 135–145)

## 2015-04-20 LAB — TSH: TSH: 0.67 u[IU]/mL (ref 0.35–4.50)

## 2015-04-20 LAB — SEDIMENTATION RATE: SED RATE: 5 mm/h (ref 0–22)

## 2015-04-20 LAB — RHEUMATOID FACTOR

## 2015-04-20 LAB — CK: CK TOTAL: 271 U/L — AB (ref 7–232)

## 2015-04-23 ENCOUNTER — Other Ambulatory Visit: Payer: Self-pay | Admitting: Internal Medicine

## 2015-04-23 ENCOUNTER — Encounter: Payer: Self-pay | Admitting: *Deleted

## 2015-04-23 DIAGNOSIS — D649 Anemia, unspecified: Secondary | ICD-10-CM

## 2015-04-23 DIAGNOSIS — R748 Abnormal levels of other serum enzymes: Secondary | ICD-10-CM

## 2015-04-23 NOTE — Progress Notes (Signed)
Order placed for f/u labs.  

## 2015-05-03 ENCOUNTER — Encounter: Payer: Self-pay | Admitting: Internal Medicine

## 2015-05-29 ENCOUNTER — Other Ambulatory Visit: Payer: Self-pay | Admitting: Internal Medicine

## 2015-05-29 ENCOUNTER — Other Ambulatory Visit: Payer: Self-pay

## 2015-05-29 MED ORDER — FLUOXETINE HCL 40 MG PO CAPS: 40.0000 mg | ORAL_CAPSULE | Freq: Every day | ORAL | Status: DC

## 2015-06-04 ENCOUNTER — Encounter: Payer: Self-pay | Admitting: Internal Medicine

## 2015-06-05 ENCOUNTER — Other Ambulatory Visit: Payer: Self-pay

## 2015-06-05 MED ORDER — PANTOPRAZOLE SODIUM 40 MG PO TBEC: DELAYED_RELEASE_TABLET | ORAL | Status: DC

## 2015-06-12 ENCOUNTER — Ambulatory Visit (INDEPENDENT_AMBULATORY_CARE_PROVIDER_SITE_OTHER): Payer: 59 | Admitting: Internal Medicine

## 2015-06-12 ENCOUNTER — Ambulatory Visit (INDEPENDENT_AMBULATORY_CARE_PROVIDER_SITE_OTHER)
Admission: RE | Admit: 2015-06-12 | Discharge: 2015-06-12 | Disposition: A | Discharge status: Home / Self Care | Payer: 59 | Source: Ambulatory Visit | Attending: Internal Medicine | Admitting: Internal Medicine

## 2015-06-12 ENCOUNTER — Encounter: Payer: Self-pay | Admitting: Internal Medicine

## 2015-06-12 VITALS — BP 110/80 | HR 59 | Temp 98.5°F | Resp 18 | Ht 64.25 in | Wt 174.0 lb

## 2015-06-12 DIAGNOSIS — F32A Depression, unspecified: Secondary | ICD-10-CM

## 2015-06-12 DIAGNOSIS — M79606 Pain in leg, unspecified: Secondary | ICD-10-CM

## 2015-06-12 DIAGNOSIS — R079 Chest pain, unspecified: Secondary | ICD-10-CM

## 2015-06-12 DIAGNOSIS — K259 Gastric ulcer, unspecified as acute or chronic, without hemorrhage or perforation: Secondary | ICD-10-CM

## 2015-06-12 DIAGNOSIS — F329 Major depressive disorder, single episode, unspecified: Secondary | ICD-10-CM

## 2015-06-12 DIAGNOSIS — M542 Cervicalgia: Secondary | ICD-10-CM

## 2015-06-12 DIAGNOSIS — Z789 Other specified health status: Secondary | ICD-10-CM

## 2015-06-12 DIAGNOSIS — K219 Gastro-esophageal reflux disease without esophagitis: Secondary | ICD-10-CM

## 2015-06-12 DIAGNOSIS — D649 Anemia, unspecified: Secondary | ICD-10-CM | POA: Diagnosis not present

## 2015-06-12 DIAGNOSIS — Z9109 Other allergy status, other than to drugs and biological substances: Secondary | ICD-10-CM

## 2015-06-12 DIAGNOSIS — R748 Abnormal levels of other serum enzymes: Secondary | ICD-10-CM | POA: Diagnosis not present

## 2015-06-12 DIAGNOSIS — Z720 Tobacco use: Secondary | ICD-10-CM

## 2015-06-12 DIAGNOSIS — Z91048 Other nonmedicinal substance allergy status: Secondary | ICD-10-CM

## 2015-06-12 DIAGNOSIS — F419 Anxiety disorder, unspecified: Secondary | ICD-10-CM

## 2015-06-12 DIAGNOSIS — Z7289 Other problems related to lifestyle: Secondary | ICD-10-CM

## 2015-06-12 NOTE — Progress Notes (Signed)
Pre-visit discussion using our clinic review tool. No additional management support is needed unless otherwise documented below in the visit note.  

## 2015-06-12 NOTE — Progress Notes (Signed)
Patient ID: Austin [name], male   DOB: Sep 06, 1980, 34 y.o.   MRN: [id number]   Subjective:    Patient ID: Austin [name], male    DOB: Sep 23, 1980, 34 y.o.   MRN: [id number]  HPI  Patient with past history of depression, GERD, hypertension, anxiety and alcohol abuse.  He comes in today to follow up on these issues.  We increased prozac last visit.  Reports increased stress at work.  We discussed this today.  Relationship going well.  Daughter is doing well.  He has cut down significantly on his alcohol intake.  He does report some neck, shoulder and anterior chest discomfort.  The discomfort is reproducible on exam.  Certain movements aggravate.  No sob.  No acid reflux.  No abdominal pain or cramping.  Bowels stable.  Not having the muscle aches and joint aches like he was having previously.     Past Medical History  Diagnosis Date  . Depression   . Ulcer     history of bleeding ulcer requiring blood transfusions  . GERD (gastroesophageal reflux disease)   . Allergy   . Heart murmur   . Hypertension   . Anxiety   . Alcohol abuse     history of   Past Surgical History  Procedure Laterality Date  . Tonsillectomy and adenoidectomy  1992  . Knee surgery      Left knee - ACL   Family History  Problem Relation Age of Onset  . Alcohol abuse Father   . Cancer Father     prostate cancer  . Arthritis Sister   . Cancer Maternal Grandmother     breast cancer  . Stroke Maternal Grandmother   . Diabetes Maternal Grandmother   . Hypertension Maternal Grandmother    Social History   Social History  . Marital Status: Married    Spouse Name: N/A  . Number of Children: N/A  . Years of Education: N/A   Social History Main Topics  . Smoking status: Former Smoker -- 0.50 packs/day    Types: Cigarettes  . Smokeless tobacco: Never Used     Comment: Quit smoking recently  . Alcohol Use: 0.0 oz/week    0 Standard drinks or equivalent per week  . Drug Use: Yes     Comment: marijuana  .  Sexual Activity: Not Asked   Other Topics Concern  . None   Social History Narrative    Outpatient Encounter Prescriptions as of 06/12/2015  Medication Sig  . cetirizine (ZYRTEC) 10 MG tablet Take 10 mg by mouth daily.  Marland Kitchen FLUoxetine (PROZAC) 40 MG capsule Take 1 capsule (40 mg total) by mouth daily.  . fluticasone (FLONASE) 50 MCG/ACT nasal spray Place 2 sprays into both nostrils daily.  . meloxicam (MOBIC) 7.5 MG tablet Take 1 tablet (7.5 mg total) by mouth daily.  . pantoprazole (PROTONIX) 40 MG tablet Take 1 tablet by mouth two  times daily  . ranitidine (ZANTAC) 150 MG tablet Take 150 mg by mouth 2 (two) times daily.  . varenicline (CHANTIX CONTINUING MONTH PAK) 1 MG tablet Take 1 tablet (1 mg total) by mouth 2 (two) times daily.  . varenicline (CHANTIX STARTING MONTH PAK) 0.5 MG X 11 & 1 MG X 42 tablet Take one 0.5 mg tablet by mouth once daily for 3 days, then increase to one 0.5 mg tablet twice daily for 4 days, then increase to one 1 mg tablet twice daily.   No facility-administered encounter medications on file  as of 06/12/2015.    Review of Systems  Constitutional: Negative for appetite change and unexpected weight change.  HENT: Negative for congestion and sinus pressure.   Eyes: Negative for pain and visual disturbance.  Respiratory: Negative for cough, chest tightness and shortness of breath.   Cardiovascular: Positive for chest pain. Negative for palpitations and leg swelling.  Gastrointestinal: Negative for nausea, vomiting, abdominal pain and diarrhea.  Genitourinary: Negative for dysuria and difficulty urinating.  Musculoskeletal: Negative for joint swelling.       Not having the muscle aches and joint aches.   Skin: Negative for color change and rash.  Neurological: Negative for dizziness, light-headedness and headaches.  Psychiatric/Behavioral: Negative for dysphoric mood.       Increased stress as outlined.         Objective:    Physical Exam    Constitutional: He appears well-developed and well-nourished. No distress.  HENT:  Nose: Nose normal.  Mouth/Throat: Oropharynx is clear and moist.  Eyes: Conjunctivae are normal. Right eye exhibits no discharge. Left eye exhibits no discharge.  Neck: Neck supple. No thyromegaly present.  Cardiovascular: Normal rate and regular rhythm.   Pulmonary/Chest: Effort normal and breath sounds normal. No respiratory distress.  Abdominal: Soft. Bowel sounds are normal. There is no tenderness.  Musculoskeletal: He exhibits no edema or tenderness.  Some increased pulling sensation with rotation of her head to left.  Some reproducible pain to palpation.   Lymphadenopathy:    He has no cervical adenopathy.  Skin: No rash noted. No erythema.  Psychiatric: He has a normal mood and affect. His behavior is normal.    BP 110/80 mmHg  Pulse 59  Temp(Src) 98.5 F (36.9 C) (Oral)  Resp 18  Ht 5' 4.25" (1.632 m)  Wt 174 lb (78.926 kg)  BMI 29.63 kg/m2  SpO2 98% Wt Readings from Last 3 Encounters:  06/12/15 174 lb (78.926 kg)  04/09/15 175 lb 8 oz (79.606 kg)  02/08/15 176 lb 6.4 oz (80.015 kg)     Lab Results  Component Value Date   WBC 8.4 06/12/2015   HGB 12.8* 04/20/2015   HCT 38.7 06/12/2015   PLT 292.0 04/20/2015   GLUCOSE 81 04/20/2015   CHOL 173 11/08/2014   TRIG 84.0 11/08/2014   HDL 70.40 11/08/2014   LDLCALC 86 11/08/2014   ALT 22 04/20/2015   AST 23 04/20/2015   NA 140 04/20/2015   K 4.3 04/20/2015   CL 103 04/20/2015   CREATININE 0.94 04/20/2015   BUN 8 04/20/2015   CO2 31 04/20/2015   TSH 0.67 04/20/2015   HGBA1C 5.8 11/08/2014       Assessment & Plan:   Problem List Items Addressed This Visit    Alcohol use (HCC)    Has decreased intake.  Follow.        Anemia    Slightly decreased hgb found on last lab.  Will recheck today.  Also check B12 and iron studies.  Follow.        Relevant Orders   CBC with Differential/Platelet (Completed)   CK (Creatine  Kinase) (Completed)   Ferritin (Completed)   B12 (Completed)   Iron Binding Cap (TIBC) (Completed)   Anxiety    On prozac.  See above.  Follow.        Chest pain    Symptoms are reproducible on exam.  Had stress test relatively recently that was negative.  NCS negative.  Will check c-spine xray.  Follow.  Depression    On prozac.  Increased stress as outlined.  Follow.  Does not feel needs any further intervention at this time.        Environmental allergies    Controlled on zyrtec.  Follow.        Gastric ulcer    Has a history of gastric ulcer.  Off mobic.   Continue protonix.        GERD (gastroesophageal reflux disease)    On protonix.  No acid reflux reported.  Follow.        Leg pain    This has improved/resolved.  Recheck ck.  Follow.        Tobacco abuse    He is mostly smoking marijuana now.  Follow.         Other Visit Diagnoses    Neck pain    -  Primary    Relevant Orders    DG Cervical Spine 2 or 3 views (Completed)    CBC with Differential/Platelet (Completed)    CK (Creatine Kinase) (Completed)    Ferritin (Completed)    B12 (Completed)    Iron Binding Cap (TIBC) (Completed)    Elevated CK        Relevant Orders    CBC with Differential/Platelet (Completed)    CK (Creatine Kinase) (Completed)    Ferritin (Completed)    B12 (Completed)    Iron Binding Cap (TIBC) (Completed)        Dale DurhamSCOTT, Terriona Horlacher, MD

## 2015-06-13 ENCOUNTER — Encounter: Payer: Self-pay | Admitting: Internal Medicine

## 2015-06-13 LAB — CBC WITH DIFFERENTIAL/PLATELET
BASOS: 0 %
Basophils Absolute: 0 10*3/uL (ref 0.0–0.2)
EOS (ABSOLUTE): 0.3 10*3/uL (ref 0.0–0.4)
EOS: 3 %
Hematocrit: 38.7 % (ref 37.5–51.0)
Hemoglobin: 13.5 g/dL (ref 12.6–17.7)
IMMATURE GRANS (ABS): 0 10*3/uL (ref 0.0–0.1)
IMMATURE GRANULOCYTES: 0 %
LYMPHS: 36 %
Lymphocytes Absolute: 3 10*3/uL (ref 0.7–3.1)
MCH: 29.5 pg (ref 26.6–33.0)
MCHC: 34.9 g/dL (ref 31.5–35.7)
MCV: 85 fL (ref 79–97)
Monocytes Absolute: 0.7 10*3/uL (ref 0.1–0.9)
Monocytes: 8 %
NEUTROS PCT: 53 %
Neutrophils Absolute: 4.4 10*3/uL (ref 1.4–7.0)
PLATELETS: 323 10*3/uL (ref 150–379)
RBC: 4.57 x10E6/uL (ref 4.14–5.80)
RDW: 14 % (ref 12.3–15.4)
WBC: 8.4 10*3/uL (ref 3.4–10.8)

## 2015-06-13 LAB — IRON AND TIBC
Iron Saturation: 22 % (ref 15–55)
Iron: 78 ug/dL (ref 38–169)
Total Iron Binding Capacity: 347 ug/dL (ref 250–450)
UIBC: 269 ug/dL (ref 111–343)

## 2015-06-13 LAB — FERRITIN: FERRITIN: 33 ng/mL (ref 30–400)

## 2015-06-13 LAB — CK: CK TOTAL: 217 U/L — AB (ref 24–204)

## 2015-06-13 LAB — VITAMIN B12: Vitamin B-12: 690 pg/mL (ref 211–946)

## 2015-06-14 ENCOUNTER — Encounter: Payer: Self-pay | Admitting: Internal Medicine

## 2015-06-18 ENCOUNTER — Encounter: Payer: Self-pay | Admitting: Internal Medicine

## 2015-06-18 DIAGNOSIS — D649 Anemia, unspecified: Secondary | ICD-10-CM | POA: Insufficient documentation

## 2015-06-18 NOTE — Assessment & Plan Note (Signed)
This has improved/resolved.  Recheck ck.  Follow.

## 2015-06-18 NOTE — Assessment & Plan Note (Signed)
On prozac.  Increased stress as outlined.  Follow.  Does not feel needs any further intervention at this time.

## 2015-06-18 NOTE — Assessment & Plan Note (Signed)
Symptoms are reproducible on exam.  Had stress test relatively recently that was negative.  NCS negative.  Will check c-spine xray.  Follow.

## 2015-06-18 NOTE — Assessment & Plan Note (Signed)
On prozac.  See above.  Follow.

## 2015-06-18 NOTE — Assessment & Plan Note (Signed)
Slightly decreased hgb found on last lab.  Will recheck today.  Also check B12 and iron studies.  Follow.

## 2015-06-18 NOTE — Assessment & Plan Note (Signed)
Has a history of gastric ulcer.  Off mobic.   Continue protonix.

## 2015-06-18 NOTE — Assessment & Plan Note (Signed)
Controlled on zyrtec.  Follow.   

## 2015-06-18 NOTE — Assessment & Plan Note (Signed)
He is mostly smoking marijuana now.  Follow.

## 2015-06-18 NOTE — Assessment & Plan Note (Signed)
On protonix.  No acid reflux reported.  Follow.   

## 2015-06-18 NOTE — Assessment & Plan Note (Signed)
Has decreased intake.  Follow.

## 2015-08-03 ENCOUNTER — Encounter: Payer: Self-pay | Admitting: Internal Medicine

## 2015-08-03 ENCOUNTER — Other Ambulatory Visit: Payer: Self-pay | Admitting: Internal Medicine

## 2015-08-03 MED ORDER — FLUOXETINE HCL 40 MG PO CAPS: 40.0000 mg | ORAL_CAPSULE | Freq: Every day | ORAL | Status: DC

## 2015-08-03 NOTE — Addendum Note (Signed)
Addended by: Romualdo Bolk D on: 08/03/2015 01:09 PM   Modules accepted: Orders

## 2015-08-28 ENCOUNTER — Other Ambulatory Visit: Payer: Self-pay | Admitting: Internal Medicine

## 2015-08-28 NOTE — Telephone Encounter (Signed)
Patient had refilled on 08/03/15 for a 90-day supply, but is asking for another refill. Please advise?

## 2015-08-29 NOTE — Telephone Encounter (Signed)
Please call pt and let him know 90 day supply sent in 08/2015.

## 2015-08-29 NOTE — Telephone Encounter (Signed)
Voicemail left stating that the 90-days was sent to [facility].

## 2015-12-14 ENCOUNTER — Other Ambulatory Visit: Payer: Self-pay | Admitting: Internal Medicine

## 2015-12-14 MED ORDER — FLUOXETINE HCL 40 MG PO CAPS: 40.0000 mg | ORAL_CAPSULE | Freq: Every day | ORAL | Status: DC

## 2015-12-14 NOTE — Telephone Encounter (Signed)
Please schedule f/u appt and refill until appt.  Thanks

## 2015-12-14 NOTE — Telephone Encounter (Signed)
Refilled on 08/2015. Last seen 06/12/15 with no future appointment. Please advise?

## 2015-12-14 NOTE — Telephone Encounter (Signed)
Last OV 12/16 ok to fill Prozac?

## 2015-12-24 ENCOUNTER — Encounter: Payer: Self-pay | Admitting: Internal Medicine

## 2015-12-25 ENCOUNTER — Other Ambulatory Visit: Payer: Self-pay | Admitting: *Deleted

## 2015-12-25 MED ORDER — PANTOPRAZOLE SODIUM 40 MG PO TBEC: DELAYED_RELEASE_TABLET | ORAL | Status: DC

## 2016-01-04 ENCOUNTER — Ambulatory Visit (INDEPENDENT_AMBULATORY_CARE_PROVIDER_SITE_OTHER): Payer: 59 | Admitting: Sports Medicine

## 2016-01-04 ENCOUNTER — Encounter: Payer: Self-pay | Admitting: Sports Medicine

## 2016-01-04 DIAGNOSIS — Q828 Other specified congenital malformations of skin: Secondary | ICD-10-CM | POA: Diagnosis not present

## 2016-01-04 DIAGNOSIS — M79672 Pain in left foot: Secondary | ICD-10-CM | POA: Diagnosis not present

## 2016-01-04 NOTE — Progress Notes (Signed)
Patient ID: Austin [name], male   DOB: [birth year], 35 y.o.   MRN: [id number] Subjective: Austin [name] is a 35 y.o. male patient who presents to office for evaluation of Left foot pain secondary to callus/hard skin. Patient complains of pain at the lesion present Left foot at the ball x 6 months worse with extensive walking or direct pressure. Patient has tried wart creams, tylenol, and medicated pads with no relief in symptoms. Patient denies any other pedal complaints.   Patient Active Problem List   Diagnosis Date Noted  . Anemia 06/18/2015  . Leg pain 04/15/2015  . Alcohol use ([place]) 02/10/2015  . Snoring 04/07/2014  . Daytime somnolence 04/07/2014  . Chest pain 11/21/2013  . Alcohol abuse 12/05/2012  . Anxiety 12/05/2012  . Depression 12/05/2012  . Tobacco abuse 12/05/2012  . Gastric ulcer 12/05/2012  . GERD (gastroesophageal reflux disease) 12/05/2012  . Knee pain 12/05/2012  . Environmental allergies 12/05/2012    Current Outpatient Prescriptions on File Prior to Visit  Medication Sig Dispense Refill  . cetirizine (ZYRTEC) 10 MG tablet Take 10 mg by mouth daily.    Marland Kitchen FLUoxetine (PROZAC) 40 MG capsule Take 1 capsule (40 mg total) by mouth daily. 60 capsule 0  . fluticasone (FLONASE) 50 MCG/ACT nasal spray Place 2 sprays into both nostrils daily. 16 g 2  . meloxicam (MOBIC) 7.5 MG tablet Take 1 tablet (7.5 mg total) by mouth daily. 15 tablet 0  . pantoprazole (PROTONIX) 40 MG tablet Take 1 tablet by mouth two  times daily 180 tablet 1  . ranitidine (ZANTAC) 150 MG tablet Take 150 mg by mouth 2 (two) times daily.    . varenicline (CHANTIX CONTINUING MONTH PAK) 1 MG tablet Take 1 tablet (1 mg total) by mouth 2 (two) times daily. 60 tablet 0  . varenicline (CHANTIX STARTING MONTH PAK) 0.5 MG X 11 & 1 MG X 42 tablet Take one 0.5 mg tablet by mouth once daily for 3 days, then increase to one 0.5 mg tablet twice daily for 4 days, then increase to one 1 mg tablet twice daily. 53 tablet 0    No current facility-administered medications on file prior to visit.    Allergies  Allergen Reactions  . Sulfa Antibiotics     Objective:  General: Alert and oriented x3 in no acute distress  Dermatology: Keratotic lesion present sub met 4 on left with skin lines transversing the lesion, pain is present with direct pressure to the lesion with a central nucleated core noted consistent with porokeratosis, no webspace macerations, no ecchymosis bilateral, all nails x 10 are well manicured.  Vascular: Dorsalis Pedis and Posterior Tibial pedal pulses 2/4, Capillary Fill Time 3 seconds, + pedal hair growth bilateral, no edema bilateral lower extremities, Temperature gradient within normal limits.  Neurology: Johney Maine sensation intact via light touch bilateral.  Musculoskeletal: Mild tenderness with palpation at the keratotic lesion site on Left, Muscular strength 5/5 in all groups without pain or limitation on range of motion. No significant lower extremity muscular or boney deformity noted.  Assessment and Plan: Problem List Items Addressed This Visit    None    Visit Diagnoses    Porokeratosis    -  Primary    Left foot pain          -Complete examination performed -Discussed treatment options -Parred keratoic lesion using a chisel blade; treated the area with Salinocaine covered with moleskin -Encouraged daily skin emollients -Encouraged use of pumice stone -Advised  good supportive shoes and inserts -Patient to return to office as needed or sooner if condition worsens. Advised patient if recurs to consider excision of lesion at next visit.   Landis Martins, DPM

## 2016-02-26 ENCOUNTER — Ambulatory Visit (INDEPENDENT_AMBULATORY_CARE_PROVIDER_SITE_OTHER): Payer: 59 | Admitting: Internal Medicine

## 2016-02-26 ENCOUNTER — Encounter: Payer: Self-pay | Admitting: Internal Medicine

## 2016-02-26 DIAGNOSIS — Z7289 Other problems related to lifestyle: Secondary | ICD-10-CM

## 2016-02-26 DIAGNOSIS — R079 Chest pain, unspecified: Secondary | ICD-10-CM | POA: Diagnosis not present

## 2016-02-26 DIAGNOSIS — K219 Gastro-esophageal reflux disease without esophagitis: Secondary | ICD-10-CM

## 2016-02-26 DIAGNOSIS — F1090 Alcohol use, unspecified, uncomplicated: Secondary | ICD-10-CM

## 2016-02-26 DIAGNOSIS — K259 Gastric ulcer, unspecified as acute or chronic, without hemorrhage or perforation: Secondary | ICD-10-CM | POA: Diagnosis not present

## 2016-02-26 DIAGNOSIS — F329 Major depressive disorder, single episode, unspecified: Secondary | ICD-10-CM | POA: Diagnosis not present

## 2016-02-26 DIAGNOSIS — F32A Depression, unspecified: Secondary | ICD-10-CM

## 2016-02-26 DIAGNOSIS — F419 Anxiety disorder, unspecified: Secondary | ICD-10-CM

## 2016-02-26 DIAGNOSIS — Z789 Other specified health status: Secondary | ICD-10-CM

## 2016-02-26 NOTE — Progress Notes (Signed)
Patient ID: Austin [name], male   DOB: 1980-10-03, 35 y.o.   MRN: [id number]   Subjective:    Patient ID: Austin [name], male    DOB: [birth year], 35 y.o.   MRN: [id number]  HPI  Patient here for a scheduled follow up.  With increased stress with his job.  Discussed with him today.  He is looking for another job.  Taking prozac.  Not sure this is helping.  Still has episodes of increased anxiety.  Will noticed some chest discomfort when increased anxiety.  He reports this is unchanged.  No chest pain with exertion.  Has had cardiac w/up previously.  Negative stress test.  Denies "heart symptoms".  No sob.  Taking protonix.  No acid reflux.  Not sure if needed.  Discussed decreasing.  No abdominal pain.  Bowels stable.     Past Medical History:  Diagnosis Date  . Alcohol abuse    history of  . Allergy   . Anxiety   . Depression   . GERD (gastroesophageal reflux disease)   . Heart murmur   . Hypertension   . Ulcer    history of bleeding ulcer requiring blood transfusions   Past Surgical History:  Procedure Laterality Date  . KNEE SURGERY     Left knee - ACL  . TONSILLECTOMY AND ADENOIDECTOMY  1992   Family History  Problem Relation Age of Onset  . Alcohol abuse Father   . Cancer Father     prostate cancer  . Arthritis Sister   . Cancer Maternal Grandmother     breast cancer  . Stroke Maternal Grandmother   . Diabetes Maternal Grandmother   . Hypertension Maternal Grandmother    Social History   Social History  . Marital status: Married    Spouse name: N/A  . Number of children: N/A  . Years of education: N/A   Social History Main Topics  . Smoking status: Former Smoker    Packs/day: 0.50    Types: Cigarettes  . Smokeless tobacco: Never Used     Comment: Quit smoking recently  . Alcohol use 0.0 oz/week  . Drug use:      Comment: marijuana  . Sexual activity: Not Asked   Other Topics Concern  . None   Social History Narrative  . None    Outpatient Encounter  Prescriptions as of 02/26/2016  Medication Sig  . cetirizine (ZYRTEC) 10 MG tablet Take 10 mg by mouth daily.  Marland Kitchen FLUoxetine (PROZAC) 40 MG capsule Take 1 capsule (40 mg total) by mouth daily.  . pantoprazole (PROTONIX) 40 MG tablet Take 1 tablet by mouth two  times daily  . ranitidine (ZANTAC) 150 MG tablet Take 150 mg by mouth 2 (two) times daily.  . [DISCONTINUED] varenicline (CHANTIX CONTINUING MONTH PAK) 1 MG tablet Take 1 tablet (1 mg total) by mouth 2 (two) times daily.  . [DISCONTINUED] varenicline (CHANTIX STARTING MONTH PAK) 0.5 MG X 11 & 1 MG X 42 tablet Take one 0.5 mg tablet by mouth once daily for 3 days, then increase to one 0.5 mg tablet twice daily for 4 days, then increase to one 1 mg tablet twice daily.  . fluticasone (FLONASE) 50 MCG/ACT nasal spray Place 2 sprays into both nostrils daily. (Patient not taking: Reported on 02/26/2016)  . meloxicam (MOBIC) 7.5 MG tablet Take 1 tablet (7.5 mg total) by mouth daily. (Patient not taking: Reported on 02/26/2016)   No facility-administered encounter medications on file as of 02/26/2016.  Review of Systems  Constitutional: Negative for appetite change and unexpected weight change.  HENT: Negative for congestion and sinus pressure.   Respiratory: Negative for cough and shortness of breath.   Cardiovascular: Negative for palpitations and leg swelling.  Gastrointestinal: Negative for abdominal pain, diarrhea, nausea and vomiting.  Genitourinary: Negative for difficulty urinating and dysuria.  Musculoskeletal: Negative for back pain and joint swelling.  Skin: Negative for color change and rash.  Neurological: Negative for dizziness, light-headedness and headaches.  Psychiatric/Behavioral:       Increased stress as outlined.         Objective:     Blood pressure rechecked by me:  120/78  Physical Exam  Constitutional: He appears well-developed and well-nourished. No distress.  HENT:  Nose: Nose normal.  Mouth/Throat:  Oropharynx is clear and moist.  Neck: Neck supple. No thyromegaly present.  Cardiovascular: Normal rate and regular rhythm.   Pulmonary/Chest: Effort normal and breath sounds normal. No respiratory distress.  Abdominal: Soft. Bowel sounds are normal. There is no tenderness.  Musculoskeletal: He exhibits no edema or tenderness.  Lymphadenopathy:    He has no cervical adenopathy.  Skin: No rash noted. No erythema.  Psychiatric: He has a normal mood and affect. His behavior is normal.    BP 122/80 (BP Location: Left Arm, Patient Position: Sitting, Cuff Size: Large)   Pulse 84   Temp 98.5 F (36.9 C) (Oral)   Resp 16   Wt 180 lb (81.6 kg)   BMI 30.66 kg/m  Wt Readings from Last 3 Encounters:  02/26/16 180 lb (81.6 kg)  06/12/15 174 lb (78.9 kg)  04/09/15 175 lb 8 oz (79.6 kg)     Lab Results  Component Value Date   WBC 8.4 06/12/2015   HGB 12.8 (L) 04/20/2015   HCT 38.7 06/12/2015   PLT 323 06/12/2015   GLUCOSE 81 04/20/2015   CHOL 173 11/08/2014   TRIG 84.0 11/08/2014   HDL 70.40 11/08/2014   LDLCALC 86 11/08/2014   ALT 22 04/20/2015   AST 23 04/20/2015   NA 140 04/20/2015   K 4.3 04/20/2015   CL 103 04/20/2015   CREATININE 0.94 04/20/2015   BUN 8 04/20/2015   CO2 31 04/20/2015   TSH 0.67 04/20/2015   HGBA1C 5.8 11/08/2014    Dg Cervical Spine 2 Or 3 Views  Result Date: 06/12/2015 CLINICAL DATA:  Neck pain extending inferiorly into the anterior chest and left shoulder EXAM: CERVICAL SPINE - 2-3 VIEW COMPARISON:  None in PACs FINDINGS: The cervical vertebral bodies are preserved in height. The disc space heights are well maintained. There is no perched facet or spinous process fracture. The prevertebral soft tissue spaces are normal. The odontoid is intact where visualized. IMPRESSION: There is no acute or significant chronic bony abnormality of the cervical spine. Electronically Signed   By: David  SwazilandJordan M.D.   On: 06/12/2015 16:19       Assessment & Plan:    Problem List Items Addressed This Visit    Alcohol use (HCC)    Discussed the need to continue to decrease amount of alcohol intake.  Follow.       Anxiety    On prozac.  Increased stress as outlined.  Discussed with him today.  Agrees to psych referral.        Chest pain    Occurs with increased anxiety.  Previous stress test negative.  Does not feel changed or related to his heart.  Discussed further w/up.  He does not feel necessary.  Follow.  Treat the increased anxiety and stress.        Depression    Increased stress as outlined.  Discussed with him today.  On prozac.  Not sure if helping with the increased stress and anxiety.  Agrees to referral to psychiatry.       Gastric ulcer    Has a history of gastric ulcer.  On protonix.  Asymptomatic.       GERD (gastroesophageal reflux disease)    On protonix.  Symptoms controlled.  Not sure if needs.  Decrease protonix to qod.  Follow symptoms.        Other Visit Diagnoses   None.      Dale [place], MD

## 2016-02-27 ENCOUNTER — Encounter: Payer: Self-pay | Admitting: Internal Medicine

## 2016-02-27 NOTE — Assessment & Plan Note (Signed)
Discussed the need to continue to decrease amount of alcohol intake.  Follow.

## 2016-02-27 NOTE — Assessment & Plan Note (Signed)
On protonix.  Symptoms controlled.  Not sure if needs.  Decrease protonix to qod.  Follow symptoms.

## 2016-02-27 NOTE — Assessment & Plan Note (Signed)
Has a history of gastric ulcer.  On protonix.  Asymptomatic.

## 2016-02-27 NOTE — Assessment & Plan Note (Signed)
Occurs with increased anxiety.  Previous stress test negative.  Does not feel changed or related to his heart.  Discussed further w/up.  He does not feel necessary.  Follow.  Treat the increased anxiety and stress.

## 2016-02-27 NOTE — Assessment & Plan Note (Signed)
Increased stress as outlined.  Discussed with him today.  On prozac.  Not sure if helping with the increased stress and anxiety.  Agrees to referral to psychiatry.

## 2016-02-27 NOTE — Assessment & Plan Note (Signed)
On prozac.  Increased stress as outlined.  Discussed with him today.  Agrees to psych referral.

## 2016-02-28 ENCOUNTER — Other Ambulatory Visit: Payer: Self-pay | Admitting: Internal Medicine

## 2016-02-28 NOTE — Telephone Encounter (Signed)
Pt was seen yesterday. Allene DillonEmily Drozdowski, CMA

## 2016-07-30 ENCOUNTER — Other Ambulatory Visit: Payer: Self-pay | Admitting: Internal Medicine

## 2016-07-30 DIAGNOSIS — K2100 Gastro-esophageal reflux disease with esophagitis, without bleeding: Secondary | ICD-10-CM

## 2016-07-30 NOTE — Telephone Encounter (Signed)
Refill sent pt informed that he will need o/v before receiving any further.

## 2016-08-28 ENCOUNTER — Encounter: Payer: 59 | Admitting: Internal Medicine

## 2016-09-22 ENCOUNTER — Encounter: Payer: Self-pay | Admitting: Internal Medicine

## 2016-09-23 MED ORDER — FLUOXETINE HCL 40 MG PO CAPS: 40.0000 mg | ORAL_CAPSULE | Freq: Every day | ORAL | 1 refills | Status: DC

## 2016-09-23 NOTE — Telephone Encounter (Signed)
rx sent in for prozac #30 with one refill.  See my chart message.

## 2016-09-24 ENCOUNTER — Telehealth: Payer: Self-pay | Admitting: Internal Medicine

## 2016-09-24 NOTE — Telephone Encounter (Signed)
Yes, agree with evaluation today to confirm etiology of pain and make sure nothing more needs to be done.

## 2016-09-24 NOTE — Telephone Encounter (Signed)
Pt called and stated that his ulcer flared up last night. He is c/o constant pain, he is not getting relief. Please advise, thank you!  Call pt @ [phone]

## 2016-09-24 NOTE — Telephone Encounter (Addendum)
Patient states  Argument last night, started having left upper abdominal pain under rib pain started  20 minutes after disagreement.   Patient states he had to come off field due to the pain.   Went home pain   was unbearable took pantoprazole and tylenol this am and still hurts.  Pain level I 3-4 now pain is located at left rib cage.   I urged patient to get evaluated at urgent care.   Patient agreed to go to urgent care to be evaluated.  Please advise.

## 2016-09-25 NOTE — Telephone Encounter (Signed)
Follow up call from visit after urgent care visit.  He states they prescribed sucralfate tablets and was told if that didn't help abdominal pain told to follow up with us.

## 2016-10-31 ENCOUNTER — Encounter: Payer: Self-pay | Admitting: Internal Medicine

## 2016-11-04 ENCOUNTER — Ambulatory Visit (INDEPENDENT_AMBULATORY_CARE_PROVIDER_SITE_OTHER): Payer: 59 | Admitting: Internal Medicine

## 2016-11-04 ENCOUNTER — Encounter: Payer: Self-pay | Admitting: Internal Medicine

## 2016-11-04 VITALS — BP 136/84 | HR 67 | Temp 98.0°F | Ht 65.0 in | Wt 183.4 lb

## 2016-11-04 DIAGNOSIS — Z0000 Encounter for general adult medical examination without abnormal findings: Secondary | ICD-10-CM

## 2016-11-04 DIAGNOSIS — K219 Gastro-esophageal reflux disease without esophagitis: Secondary | ICD-10-CM | POA: Diagnosis not present

## 2016-11-04 DIAGNOSIS — Z13220 Encounter for screening for lipoid disorders: Secondary | ICD-10-CM

## 2016-11-04 DIAGNOSIS — R748 Abnormal levels of other serum enzymes: Secondary | ICD-10-CM | POA: Diagnosis not present

## 2016-11-04 DIAGNOSIS — F1010 Alcohol abuse, uncomplicated: Secondary | ICD-10-CM

## 2016-11-04 DIAGNOSIS — F419 Anxiety disorder, unspecified: Secondary | ICD-10-CM | POA: Diagnosis not present

## 2016-11-04 DIAGNOSIS — D649 Anemia, unspecified: Secondary | ICD-10-CM

## 2016-11-04 DIAGNOSIS — Z720 Tobacco use: Secondary | ICD-10-CM

## 2016-11-04 DIAGNOSIS — R030 Elevated blood-pressure reading, without diagnosis of hypertension: Secondary | ICD-10-CM

## 2016-11-04 DIAGNOSIS — F329 Major depressive disorder, single episode, unspecified: Secondary | ICD-10-CM

## 2016-11-04 DIAGNOSIS — F32A Depression, unspecified: Secondary | ICD-10-CM

## 2016-11-04 NOTE — Progress Notes (Signed)
Patient ID: Austin [name] Bayona, male   DOB: April 23, 1981, 36 y.o.   MRN: [id number]   Subjective:    Patient ID: Austin [name] Parekh, male    DOB: April 23, 1981, 36 y.o.   MRN: [id number]  HPI  Patient here for his physical exam.  He reports his stress with work is better.  Working situation is better.  Staying active.  No chest pain.  No sob.  No acid reflux.  No abdominal pain.  Bowels moving.  Still smoking.  Also smoking marijuana.  Discussed the need to stop smoking.  Also discussed increased alcohol intake.  Discussed the need to decrease.  His wife is expecting another baby.  Due in 11/2016.  Increased stress related to this.     Past Medical History:  Diagnosis Date  . Alcohol abuse    history of  . Allergy   . Anxiety   . Depression   . GERD (gastroesophageal reflux disease)   . Heart murmur   . Hypertension   . Ulcer    history of bleeding ulcer requiring blood transfusions   Past Surgical History:  Procedure Laterality Date  . KNEE SURGERY     Left knee - ACL  . TONSILLECTOMY AND ADENOIDECTOMY  1992   Family History  Problem Relation Age of Onset  . Alcohol abuse Father   . Cancer Father        prostate cancer  . Arthritis Sister   . Cancer Maternal Grandmother        breast cancer  . Stroke Maternal Grandmother   . Diabetes Maternal Grandmother   . Hypertension Maternal Grandmother    Social History   Social History  . Marital status: Married    Spouse name: N/A  . Number of children: N/A  . Years of education: N/A   Social History Main Topics  . Smoking status: Current Every Day Smoker    Packs/day: 0.50    Types: Cigarettes  . Smokeless tobacco: Never Used     Comment: Quit smoking recently  . Alcohol use 0.0 oz/week  . Drug use: Yes     Comment: marijuana  . Sexual activity: Not Asked   Other Topics Concern  . None   Social History Narrative  . None    Outpatient Encounter Prescriptions as of 11/04/2016  Medication Sig  . cetirizine (ZYRTEC) 10 MG tablet  Take 10 mg by mouth daily.  Marland Kitchen. FLUoxetine (PROZAC) 40 MG capsule Take 1 capsule (40 mg total) by mouth daily.  . pantoprazole (PROTONIX) 40 MG tablet TAKE 1 TABLET BY MOUTH TWO TIMES DAILY  . ranitidine (ZANTAC) 150 MG tablet Take 150 mg by mouth 2 (two) times daily.  . [DISCONTINUED] fluticasone (FLONASE) 50 MCG/ACT nasal spray Place 2 sprays into both nostrils daily. (Patient not taking: Reported on 02/26/2016)  . [DISCONTINUED] meloxicam (MOBIC) 7.5 MG tablet Take 1 tablet (7.5 mg total) by mouth daily. (Patient not taking: Reported on 02/26/2016)   No facility-administered encounter medications on file as of 11/04/2016.     Review of Systems  Constitutional: Negative for appetite change and unexpected weight change.  HENT: Negative for congestion and sinus pressure.   Eyes: Negative for pain and visual disturbance.  Respiratory: Negative for cough, chest tightness and shortness of breath.   Cardiovascular: Negative for chest pain, palpitations and leg swelling.  Gastrointestinal: Negative for abdominal pain, diarrhea, nausea and vomiting.  Genitourinary: Negative for difficulty urinating and dysuria.  Musculoskeletal: Negative for back pain and joint swelling.  Skin: Negative for color change and rash.  Neurological: Negative for dizziness, light-headedness and headaches.  Hematological: Negative for adenopathy. Does not bruise/bleed easily.  Psychiatric/Behavioral: Negative for agitation and dysphoric mood.       Objective:     Blood pressure rechecked by me:  595-638/75  Physical Exam  Constitutional: He is oriented to person, place, and time. He appears well-developed and well-nourished. No distress.  HENT:  Head: Normocephalic and atraumatic.  Nose: Nose normal.  Mouth/Throat: Oropharynx is clear and moist. No oropharyngeal exudate.  Eyes: Conjunctivae are normal. Right eye exhibits no discharge. Left eye exhibits no discharge.  Neck: Neck supple. No thyromegaly present.    Cardiovascular: Normal rate and regular rhythm.   Pulmonary/Chest: Breath sounds normal. No respiratory distress. He has no wheezes.  Abdominal: Soft. Bowel sounds are normal. There is no tenderness.  Genitourinary:  Genitourinary Comments: Normal descended testicles.  No palpable nodules.    Musculoskeletal: He exhibits no edema or tenderness.  Lymphadenopathy:    He has no cervical adenopathy.  Neurological: He is alert and oriented to person, place, and time.  Skin: Skin is warm and dry. No rash noted.  Psychiatric: He has a normal mood and affect. His behavior is normal.    BP 136/84   Pulse 67   Temp 98 F (36.7 C) (Oral)   Ht 5\' 5"  (1.651 m)   Wt 183 lb 6.4 oz (83.2 kg)   SpO2 99%   BMI 30.52 kg/m  Wt Readings from Last 3 Encounters:  11/04/16 183 lb 6.4 oz (83.2 kg)  02/26/16 180 lb (81.6 kg)  06/12/15 174 lb (78.9 kg)     Lab Results  Component Value Date   WBC 5.5 11/07/2016   HGB 12.8 (L) 04/20/2015   HCT 41.2 11/07/2016   PLT 291 11/07/2016   GLUCOSE 108 (H) 11/07/2016   CHOL 154 11/07/2016   TRIG 76 11/07/2016   HDL 65 11/07/2016   LDLCALC 74 11/07/2016   ALT 22 11/07/2016   AST 21 11/07/2016   NA 141 11/07/2016   K 4.1 11/07/2016   CL 96 11/07/2016   CREATININE 0.95 11/07/2016   BUN 9 11/07/2016   CO2 21 11/07/2016   TSH 0.657 11/07/2016   HGBA1C 5.8 11/08/2014    Dg Cervical Spine 2 Or 3 Views  Result Date: 06/12/2015 CLINICAL DATA:  Neck pain extending inferiorly into the anterior chest and left shoulder EXAM: CERVICAL SPINE - 2-3 VIEW COMPARISON:  None in PACs FINDINGS: The cervical vertebral bodies are preserved in height. The disc space heights are well maintained. There is no perched facet or spinous process fracture. The prevertebral soft tissue spaces are normal. The odontoid is intact where visualized. IMPRESSION: There is no acute or significant chronic bony abnormality of the cervical spine. Electronically Signed   By: David  [place]  M.D.   On: 06/12/2015 16:19       Assessment & Plan:   Problem List Items Addressed This Visit    Alcohol abuse    States has cut down.  Follow.        Anemia - Primary    Recheck cbc with next labs.        Relevant Orders   CBC with Differential/Platelet (Completed)   Hepatic function panel (Completed)   TSH (Completed)   Anxiety    On prozac.  States stress at work is better.  Does not feel needs anything more at this time.  Follow.  Depression    On prozac.  Discussed with him today.  Doing better.  Follow.        GERD (gastroesophageal reflux disease)    Controlled on current regimen.  Follow.        Tobacco abuse    Discussed the need to quit.  Follow.         Other Visit Diagnoses    Elevated CK       Relevant Orders   Basic metabolic panel (Completed)   CK (Creatine Kinase) (Completed)   Screening cholesterol level       Relevant Orders   Lipid panel (Completed)   Elevated blood pressure reading       Elevated blood pressure on initial check.  recheck improved.  have him spot check his pressure.  follow.         Dale Richboro, MD

## 2016-11-08 LAB — CBC WITH DIFFERENTIAL/PLATELET
BASOS: 0 %
Basophils Absolute: 0 10*3/uL (ref 0.0–0.2)
EOS (ABSOLUTE): 0.4 10*3/uL (ref 0.0–0.4)
Eos: 7 %
Hematocrit: 41.2 % (ref 37.5–51.0)
Hemoglobin: 14.2 g/dL (ref 13.0–17.7)
IMMATURE GRANS (ABS): 0 10*3/uL (ref 0.0–0.1)
IMMATURE GRANULOCYTES: 0 %
LYMPHS: 36 %
Lymphocytes Absolute: 2 10*3/uL (ref 0.7–3.1)
MCH: 29.8 pg (ref 26.6–33.0)
MCHC: 34.5 g/dL (ref 31.5–35.7)
MCV: 86 fL (ref 79–97)
MONOS ABS: 0.9 10*3/uL (ref 0.1–0.9)
Monocytes: 17 %
NEUTROS PCT: 40 %
Neutrophils Absolute: 2.2 10*3/uL (ref 1.4–7.0)
PLATELETS: 291 10*3/uL (ref 150–379)
RBC: 4.77 x10E6/uL (ref 4.14–5.80)
RDW: 14.2 % (ref 12.3–15.4)
WBC: 5.5 10*3/uL (ref 3.4–10.8)

## 2016-11-08 LAB — BASIC METABOLIC PANEL
BUN / CREAT RATIO: 9 (ref 9–20)
BUN: 9 mg/dL (ref 6–20)
CHLORIDE: 96 mmol/L (ref 96–106)
CO2: 21 mmol/L (ref 18–29)
Calcium: 8.9 mg/dL (ref 8.7–10.2)
Creatinine, Ser: 0.95 mg/dL (ref 0.76–1.27)
GFR calc non Af Amer: 103 mL/min/{1.73_m2} (ref >59)
GFR, EST AFRICAN AMERICAN: 119 mL/min/{1.73_m2} (ref >59)
Glucose: 108 mg/dL — ABNORMAL HIGH (ref 65–99)
POTASSIUM: 4.1 mmol/L (ref 3.5–5.2)
SODIUM: 141 mmol/L (ref 134–144)

## 2016-11-08 LAB — LIPID PANEL
CHOL/HDL RATIO: 2.4 ratio (ref 0.0–5.0)
Cholesterol, Total: 154 mg/dL (ref 100–199)
HDL: 65 mg/dL (ref >39)
LDL CALC: 74 mg/dL (ref 0–99)
Triglycerides: 76 mg/dL (ref 0–149)
VLDL CHOLESTEROL CAL: 15 mg/dL (ref 5–40)

## 2016-11-08 LAB — HEPATIC FUNCTION PANEL
ALBUMIN: 4.7 g/dL (ref 3.5–5.5)
ALT: 22 IU/L (ref 0–44)
AST: 21 IU/L (ref 0–40)
Alkaline Phosphatase: 88 IU/L (ref 39–117)
BILIRUBIN TOTAL: 0.3 mg/dL (ref 0.0–1.2)
BILIRUBIN, DIRECT: 0.1 mg/dL (ref 0.00–0.40)
TOTAL PROTEIN: 7.3 g/dL (ref 6.0–8.5)

## 2016-11-08 LAB — TSH: TSH: 0.657 u[IU]/mL (ref 0.450–4.500)

## 2016-11-08 LAB — CK: Total CK: 242 U/L — ABNORMAL HIGH (ref 24–204)

## 2016-11-09 ENCOUNTER — Encounter: Payer: Self-pay | Admitting: Internal Medicine

## 2016-11-09 NOTE — Assessment & Plan Note (Signed)
On prozac.  States stress at work is better.  Does not feel needs anything more at this time.  Follow.

## 2016-11-09 NOTE — Assessment & Plan Note (Signed)
On prozac.  Discussed with him today.  Doing better.  Follow.

## 2016-11-09 NOTE — Assessment & Plan Note (Signed)
States has cut down.  Follow.

## 2016-11-09 NOTE — Assessment & Plan Note (Signed)
Controlled on current regimen.  Follow.  

## 2016-11-09 NOTE — Assessment & Plan Note (Signed)
Recheck cbc with next labs.   

## 2016-11-09 NOTE — Assessment & Plan Note (Signed)
Discussed the need to quit.  Follow.  

## 2016-11-10 ENCOUNTER — Telehealth: Payer: Self-pay | Admitting: Internal Medicine

## 2016-11-10 DIAGNOSIS — R739 Hyperglycemia, unspecified: Secondary | ICD-10-CM

## 2016-11-10 NOTE — Telephone Encounter (Signed)
Orders placed for [facility] labs.   

## 2016-11-14 NOTE — Telephone Encounter (Signed)
Patient called back informed to have labs done

## 2016-11-17 ENCOUNTER — Other Ambulatory Visit: Payer: Self-pay | Admitting: Internal Medicine

## 2016-11-17 DIAGNOSIS — K2100 Gastro-esophageal reflux disease with esophagitis, without bleeding: Secondary | ICD-10-CM

## 2016-11-18 MED ORDER — PANTOPRAZOLE SODIUM 40 MG PO TBEC: 40.0000 mg | DELAYED_RELEASE_TABLET | Freq: Two times a day (BID) | ORAL | 0 refills | Status: DC

## 2016-12-02 ENCOUNTER — Ambulatory Visit (INDEPENDENT_AMBULATORY_CARE_PROVIDER_SITE_OTHER): Payer: 59 | Admitting: Internal Medicine

## 2016-12-02 ENCOUNTER — Encounter: Payer: Self-pay | Admitting: Internal Medicine

## 2016-12-02 DIAGNOSIS — K2100 Gastro-esophageal reflux disease with esophagitis, without bleeding: Secondary | ICD-10-CM

## 2016-12-02 DIAGNOSIS — F329 Major depressive disorder, single episode, unspecified: Secondary | ICD-10-CM

## 2016-12-02 DIAGNOSIS — F419 Anxiety disorder, unspecified: Secondary | ICD-10-CM | POA: Diagnosis not present

## 2016-12-02 DIAGNOSIS — Z7289 Other problems related to lifestyle: Secondary | ICD-10-CM

## 2016-12-02 DIAGNOSIS — F32A Depression, unspecified: Secondary | ICD-10-CM

## 2016-12-02 DIAGNOSIS — Z789 Other specified health status: Secondary | ICD-10-CM | POA: Diagnosis not present

## 2016-12-02 DIAGNOSIS — R011 Cardiac murmur, unspecified: Secondary | ICD-10-CM

## 2016-12-02 NOTE — Progress Notes (Signed)
Patient ID: Austin [name], male   DOB: Feb 18, 1981, 36 y.o.   MRN: [id number]   Subjective:    Patient ID: Austin [name], male    DOB: 1981-05-23, 36 y.o.   MRN: [id number]  HPI  Patient here for a scheduled follow up.  Blood pressure was elevated on last visit.  He reports he is doing relatively well.  Stress is better.  Wife is expecting.  Due in two days.  Increased stress related to this.  Overall feels he is handling things relatively well.  No chest pain.  No sob.  No acid reflux.  No abdominal pain.  Bowels moving.  Discussed continued decreased alcohol intake. Smokes mariguana.  Helps him relax.     Past Medical History:  Diagnosis Date  . Alcohol abuse    history of  . Allergy   . Anxiety   . Depression   . GERD (gastroesophageal reflux disease)   . Heart murmur   . Hypertension   . Ulcer    history of bleeding ulcer requiring blood transfusions   Past Surgical History:  Procedure Laterality Date  . KNEE SURGERY     Left knee - ACL  . TONSILLECTOMY AND ADENOIDECTOMY  1992   Family History  Problem Relation Age of Onset  . Alcohol abuse Father   . Cancer Father        prostate cancer  . Arthritis Sister   . Cancer Maternal Grandmother        breast cancer  . Stroke Maternal Grandmother   . Diabetes Maternal Grandmother   . Hypertension Maternal Grandmother    Social History   Social History  . Marital status: Married    Spouse name: N/A  . Number of children: N/A  . Years of education: N/A   Social History Main Topics  . Smoking status: Current Every Day Smoker    Packs/day: 0.50    Types: Cigarettes  . Smokeless tobacco: Never Used     Comment: Quit smoking recently  . Alcohol use 0.0 oz/week  . Drug use: Yes     Comment: marijuana  . Sexual activity: Not Asked   Other Topics Concern  . None   Social History Narrative  . None    Outpatient Encounter Prescriptions as of 12/02/2016  Medication Sig  . cetirizine (ZYRTEC) 10 MG tablet Take 10 mg by  mouth daily.  Marland Kitchen FLUoxetine (PROZAC) 40 MG capsule Take 1 capsule (40 mg total) by mouth daily.  . pantoprazole (PROTONIX) 40 MG tablet Take 1 tablet (40 mg total) by mouth 2 (two) times daily.  . ranitidine (ZANTAC) 150 MG tablet Take 150 mg by mouth 2 (two) times daily.  . [DISCONTINUED] FLUoxetine (PROZAC) 40 MG capsule Take 1 capsule (40 mg total) by mouth daily.  . [DISCONTINUED] pantoprazole (PROTONIX) 40 MG tablet Take 1 tablet (40 mg total) by mouth 2 (two) times daily.   No facility-administered encounter medications on file as of 12/02/2016.     Review of Systems  Constitutional: Negative for appetite change and unexpected weight change.  HENT: Negative for congestion and sinus pressure.   Respiratory: Negative for cough, chest tightness and shortness of breath.   Cardiovascular: Negative for chest pain, palpitations and leg swelling.  Gastrointestinal: Negative for abdominal pain, diarrhea, nausea and vomiting.  Genitourinary: Negative for difficulty urinating and dysuria.  Musculoskeletal: Negative for back pain and joint swelling.  Skin: Negative for color change and rash.  Neurological: Negative for dizziness, light-headedness and  headaches.  Psychiatric/Behavioral: Negative for agitation and dysphoric mood.       Objective:     Blood pressure rechecked by me:  138-140/80  Physical Exam  Constitutional: He appears well-developed and well-nourished. No distress.  HENT:  Nose: Nose normal.  Mouth/Throat: Oropharynx is clear and moist.  Neck: Neck supple. No thyromegaly present.  Cardiovascular: Normal rate and regular rhythm.   1-2/6 systolic murmur.     Pulmonary/Chest: Effort normal and breath sounds normal. No respiratory distress.  Abdominal: Soft. Bowel sounds are normal. There is no tenderness.  Musculoskeletal: He exhibits no edema or tenderness.  Lymphadenopathy:    He has no cervical adenopathy.  Skin: No rash noted. No erythema.  Psychiatric: He has a  normal mood and affect. His behavior is normal.    BP (!) 160/88 (BP Location: Left Arm, Patient Position: Sitting, Cuff Size: Normal)   Pulse 68   Temp 98.6 F (37 C) (Oral)   Resp 12   Ht 5\' 5"  (1.651 m)   Wt 185 lb (83.9 kg)   SpO2 96%   BMI 30.79 kg/m  Wt Readings from Last 3 Encounters:  12/02/16 185 lb (83.9 kg)  11/04/16 183 lb 6.4 oz (83.2 kg)  02/26/16 180 lb (81.6 kg)     Lab Results  Component Value Date   WBC 5.5 11/07/2016   HGB 14.2 11/07/2016   HCT 41.2 11/07/2016   PLT 291 11/07/2016   GLUCOSE 108 (H) 11/07/2016   CHOL 154 11/07/2016   TRIG 76 11/07/2016   HDL 65 11/07/2016   LDLCALC 74 11/07/2016   ALT 22 11/07/2016   AST 21 11/07/2016   NA 141 11/07/2016   K 4.1 11/07/2016   CL 96 11/07/2016   CREATININE 0.95 11/07/2016   BUN 9 11/07/2016   CO2 21 11/07/2016   TSH 0.657 11/07/2016   HGBA1C 5.8 (H) 12/02/2016    Dg Cervical Spine 2 Or 3 Views  Result Date: 06/12/2015 CLINICAL DATA:  Neck pain extending inferiorly into the anterior chest and left shoulder EXAM: CERVICAL SPINE - 2-3 VIEW COMPARISON:  None in PACs FINDINGS: The cervical vertebral bodies are preserved in height. The disc space heights are well maintained. There is no perched facet or spinous process fracture. The prevertebral soft tissue spaces are normal. The odontoid is intact where visualized. IMPRESSION: There is no acute or significant chronic bony abnormality of the cervical spine. Electronically Signed   By: David  [place] M.D.   On: 06/12/2015 16:19       Assessment & Plan:   Problem List Items Addressed This Visit    Alcohol use    Discussed continued need to decrease amount of alcohol intake.  Follow.        Anxiety    On prozac.  Stress is better.  Overall feels doing well.  Follow.        Relevant Medications   FLUoxetine (PROZAC) 40 MG capsule   Depression    On prozac.  Feels doing better.  Follow.       Relevant Medications   FLUoxetine (PROZAC) 40 MG  capsule   GERD (gastroesophageal reflux disease)    Controlled on current regimen.  Follow.        Relevant Medications   pantoprazole (PROTONIX) 40 MG tablet   Heart murmur   Relevant Orders   ECHOCARDIOGRAM COMPLETE       Dale DurhamSCOTT, Ramere Downs, MD

## 2016-12-02 NOTE — Progress Notes (Signed)
Pre-visit discussion using our clinic review tool. No additional management support is needed unless otherwise documented below in the visit note.  

## 2016-12-03 LAB — HEMOGLOBIN A1C
Est. average glucose Bld gHb Est-mCnc: 120 mg/dL
HEMOGLOBIN A1C: 5.8 % — AB (ref 4.8–5.6)

## 2016-12-03 LAB — GLUCOSE, FASTING: GLUCOSE, PLASMA: 85 mg/dL (ref 65–99)

## 2016-12-04 ENCOUNTER — Encounter: Payer: Self-pay | Admitting: Internal Medicine

## 2016-12-04 DIAGNOSIS — R011 Cardiac murmur, unspecified: Secondary | ICD-10-CM | POA: Insufficient documentation

## 2016-12-04 MED ORDER — PANTOPRAZOLE SODIUM 40 MG PO TBEC: 40.0000 mg | DELAYED_RELEASE_TABLET | Freq: Two times a day (BID) | ORAL | 0 refills | Status: DC

## 2016-12-04 MED ORDER — FLUOXETINE HCL 40 MG PO CAPS: 40.0000 mg | ORAL_CAPSULE | Freq: Every day | ORAL | 2 refills | Status: DC

## 2016-12-04 NOTE — Assessment & Plan Note (Signed)
On prozac.  Feels doing better.  Follow.

## 2016-12-04 NOTE — Assessment & Plan Note (Signed)
On prozac.  Stress is better.  Overall feels doing well.  Follow.

## 2016-12-04 NOTE — Assessment & Plan Note (Signed)
Controlled on current regimen.  Follow.  

## 2016-12-04 NOTE — Assessment & Plan Note (Signed)
Discussed continued need to decrease amount of alcohol intake.  Follow.

## 2017-01-12 ENCOUNTER — Other Ambulatory Visit: Payer: 59

## 2017-01-21 ENCOUNTER — Ambulatory Visit (INDEPENDENT_AMBULATORY_CARE_PROVIDER_SITE_OTHER): Payer: 59

## 2017-01-21 ENCOUNTER — Other Ambulatory Visit: Payer: Self-pay

## 2017-01-21 DIAGNOSIS — R011 Cardiac murmur, unspecified: Secondary | ICD-10-CM | POA: Diagnosis not present

## 2017-01-22 ENCOUNTER — Encounter: Payer: Self-pay | Admitting: Internal Medicine

## 2017-02-05 ENCOUNTER — Encounter: Payer: Self-pay | Admitting: Internal Medicine

## 2017-02-05 ENCOUNTER — Ambulatory Visit (INDEPENDENT_AMBULATORY_CARE_PROVIDER_SITE_OTHER): Payer: 59 | Admitting: Internal Medicine

## 2017-02-05 DIAGNOSIS — K2100 Gastro-esophageal reflux disease with esophagitis, without bleeding: Secondary | ICD-10-CM

## 2017-02-05 DIAGNOSIS — Z7289 Other problems related to lifestyle: Secondary | ICD-10-CM

## 2017-02-05 DIAGNOSIS — Z789 Other specified health status: Secondary | ICD-10-CM

## 2017-02-05 DIAGNOSIS — Z9109 Other allergy status, other than to drugs and biological substances: Secondary | ICD-10-CM | POA: Diagnosis not present

## 2017-02-05 DIAGNOSIS — F419 Anxiety disorder, unspecified: Secondary | ICD-10-CM

## 2017-02-05 DIAGNOSIS — F329 Major depressive disorder, single episode, unspecified: Secondary | ICD-10-CM

## 2017-02-05 DIAGNOSIS — F32A Depression, unspecified: Secondary | ICD-10-CM

## 2017-02-05 MED ORDER — PANTOPRAZOLE SODIUM 40 MG PO TBEC: 40.0000 mg | DELAYED_RELEASE_TABLET | Freq: Two times a day (BID) | ORAL | 1 refills | Status: DC

## 2017-02-05 MED ORDER — FLUOXETINE HCL 40 MG PO CAPS: 40.0000 mg | ORAL_CAPSULE | Freq: Every day | ORAL | 1 refills | Status: DC

## 2017-02-05 NOTE — Progress Notes (Signed)
Patient ID: Austin [name], male   DOB: [birth year], 36 y.o.   MRN: [id number]   Subjective:    Patient ID: Austin [name], male    DOB: 05/19/1981, 36 y.o.   MRN: [id number]  HPI  Patient here for a scheduled follow up.  States he is doing well.  Son born a couple of months ago.  Things are going well.  Stress is better.  States if he takes his prozac regularly, anxiety is better and pretty well controlled.  Reported no chest pan.  Breathing stable.  No problems with acid reflux.  No abdominal pain.  Bowels moving.  Discussed need to decrease alcohol intake.  Drinks 1-2 beers per day.  Discussed weight gain.  Discussed diet and exercise.  Overall he feel things are stable.  Has a [employer] form he needs completed.  Will drop by.     Past Medical History:  Diagnosis Date  . Alcohol abuse    history of  . Allergy   . Anxiety   . Depression   . GERD (gastroesophageal reflux disease)   . Heart murmur   . Hypertension   . Ulcer    history of bleeding ulcer requiring blood transfusions   Past Surgical History:  Procedure Laterality Date  . KNEE SURGERY     Left knee - ACL  . TONSILLECTOMY AND ADENOIDECTOMY  1992   Family History  Problem Relation Age of Onset  . Alcohol abuse Father   . Cancer Father        prostate cancer  . Arthritis Sister   . Cancer Maternal Grandmother        breast cancer  . Stroke Maternal Grandmother   . Diabetes Maternal Grandmother   . Hypertension Maternal Grandmother    Social History   Social History  . Marital status: Married    Spouse name: N/A  . Number of children: N/A  . Years of education: N/A   Social History Main Topics  . Smoking status: Current Every Day Smoker    Packs/day: 0.50    Types: Cigarettes  . Smokeless tobacco: Never Used     Comment: Quit smoking recently  . Alcohol use 0.0 oz/week  . Drug use: Yes     Comment: marijuana  . Sexual activity: Not Asked   Other Topics Concern  . None   Social History Narrative  . None     Outpatient Encounter Prescriptions as of 02/05/2017  Medication Sig  . cetirizine (ZYRTEC) 10 MG tablet Take 10 mg by mouth daily.  Marland Kitchen FLUoxetine (PROZAC) 40 MG capsule Take 1 capsule (40 mg total) by mouth daily.  . pantoprazole (PROTONIX) 40 MG tablet Take 1 tablet (40 mg total) by mouth 2 (two) times daily.  . ranitidine (ZANTAC) 150 MG tablet Take 150 mg by mouth 2 (two) times daily.  . [DISCONTINUED] FLUoxetine (PROZAC) 40 MG capsule Take 1 capsule (40 mg total) by mouth daily.  . [DISCONTINUED] pantoprazole (PROTONIX) 40 MG tablet Take 1 tablet (40 mg total) by mouth 2 (two) times daily.   No facility-administered encounter medications on file as of 02/05/2017.     Review of Systems  Constitutional: Negative for appetite change.       Increased weight as outlined.    HENT: Negative for congestion and sinus pressure.   Respiratory: Negative for cough, chest tightness and shortness of breath.   Cardiovascular: Negative for chest pain, palpitations and leg swelling.  Gastrointestinal: Negative for abdominal pain, diarrhea,  nausea and vomiting.  Genitourinary: Negative for difficulty urinating and dysuria.  Musculoskeletal: Negative for back pain and joint swelling.  Skin: Negative for color change and rash.  Neurological: Negative for dizziness, light-headedness and headaches.  Psychiatric/Behavioral: Negative for agitation and dysphoric mood.       Objective:     Blood pressure rechecked by me:  130/72  Physical Exam  Constitutional: He appears well-developed and well-nourished. No distress.  HENT:  Nose: Nose normal.  Mouth/Throat: Oropharynx is clear and moist.  Neck: Neck supple. No thyromegaly present.  Cardiovascular: Normal rate and regular rhythm.   Pulmonary/Chest: Effort normal and breath sounds normal. No respiratory distress.  Abdominal: Soft. Bowel sounds are normal. There is no tenderness.  Musculoskeletal: He exhibits no edema or tenderness.    Lymphadenopathy:    He has no cervical adenopathy.  Skin: No rash noted. No erythema.  Psychiatric: He has a normal mood and affect. His behavior is normal.    BP 130/72   Pulse 84   Temp 98.6 F (37 C) (Oral)   Resp 12   Ht 5\' 5"  (1.651 m)   Wt 189 lb 9.6 oz (86 kg)   SpO2 96%   BMI 31.55 kg/m  Wt Readings from Last 3 Encounters:  02/05/17 189 lb 9.6 oz (86 kg)  12/02/16 185 lb (83.9 kg)  11/04/16 183 lb 6.4 oz (83.2 kg)     Lab Results  Component Value Date   WBC 5.5 11/07/2016   HGB 14.2 11/07/2016   HCT 41.2 11/07/2016   PLT 291 11/07/2016   GLUCOSE 108 (H) 11/07/2016   CHOL 154 11/07/2016   TRIG 76 11/07/2016   HDL 65 11/07/2016   LDLCALC 74 11/07/2016   ALT 22 11/07/2016   AST 21 11/07/2016   NA 141 11/07/2016   K 4.1 11/07/2016   CL 96 11/07/2016   CREATININE 0.95 11/07/2016   BUN 9 11/07/2016   CO2 21 11/07/2016   TSH 0.657 11/07/2016   HGBA1C 5.8 (H) 12/02/2016    Dg Cervical Spine 2 Or 3 Views  Result Date: 06/12/2015 CLINICAL DATA:  Neck pain extending inferiorly into the anterior chest and left shoulder EXAM: CERVICAL SPINE - 2-3 VIEW COMPARISON:  None in PACs FINDINGS: The cervical vertebral bodies are preserved in height. The disc space heights are well maintained. There is no perched facet or spinous process fracture. The prevertebral soft tissue spaces are normal. The odontoid is intact where visualized. IMPRESSION: There is no acute or significant chronic bony abnormality of the cervical spine. Electronically Signed   By: David  [place]Jordan M.D.   On: 06/12/2015 16:19       Assessment & Plan:   Problem List Items Addressed This Visit    Alcohol use    Discussed the need to reduce alcohol intake.  Follow.        Anxiety    On prozac.  Stable.       Relevant Medications   FLUoxetine (PROZAC) 40 MG capsule   Depression    On prozac.  Feels stable.  Follow.       Relevant Medications   FLUoxetine (PROZAC) 40 MG capsule   Environmental  allergies    Controlled on zyrtec.      GERD (gastroesophageal reflux disease)    Controlled on protonix.        Relevant Medications   pantoprazole (PROTONIX) 40 MG tablet       Dale DurhamSCOTT, Kyung Muto, MD

## 2017-02-07 ENCOUNTER — Encounter: Payer: Self-pay | Admitting: Internal Medicine

## 2017-02-07 NOTE — Assessment & Plan Note (Signed)
On prozac.  Stable.   

## 2017-02-07 NOTE — Assessment & Plan Note (Signed)
Controlled on protonix.   

## 2017-02-07 NOTE — Assessment & Plan Note (Signed)
Discussed the need to reduce alcohol intake.  Follow.

## 2017-02-07 NOTE — Assessment & Plan Note (Signed)
On prozac.  Feels stable.  Follow.

## 2017-02-07 NOTE — Assessment & Plan Note (Signed)
Controlled on zyrtec 

## 2017-03-25 ENCOUNTER — Other Ambulatory Visit: Payer: Self-pay | Admitting: Internal Medicine

## 2017-04-05 IMAGING — CR DG CERVICAL SPINE 2 OR 3 VIEWS
3 series · 3 of 3 positions shown · non-contrast
Comparison: None in PACs

CLINICAL DATA: Neck pain extending inferiorly into the anterior
chest and left shoulder

EXAM:
CERVICAL SPINE - 2-3 VIEW

[view not recorded (1 of 3)]
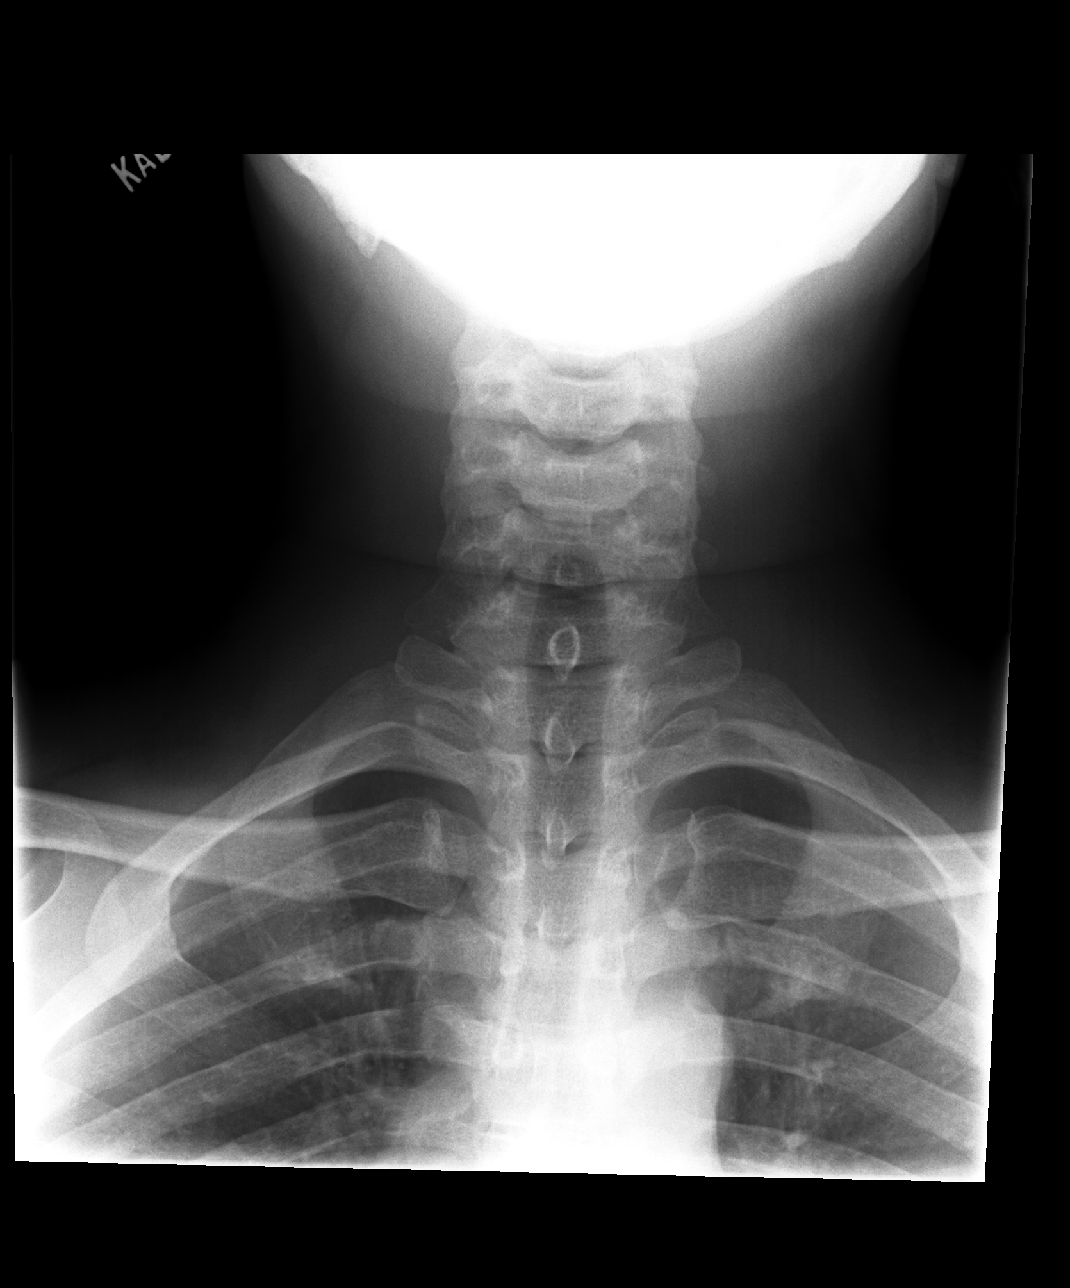

[view not recorded (2 of 3)]
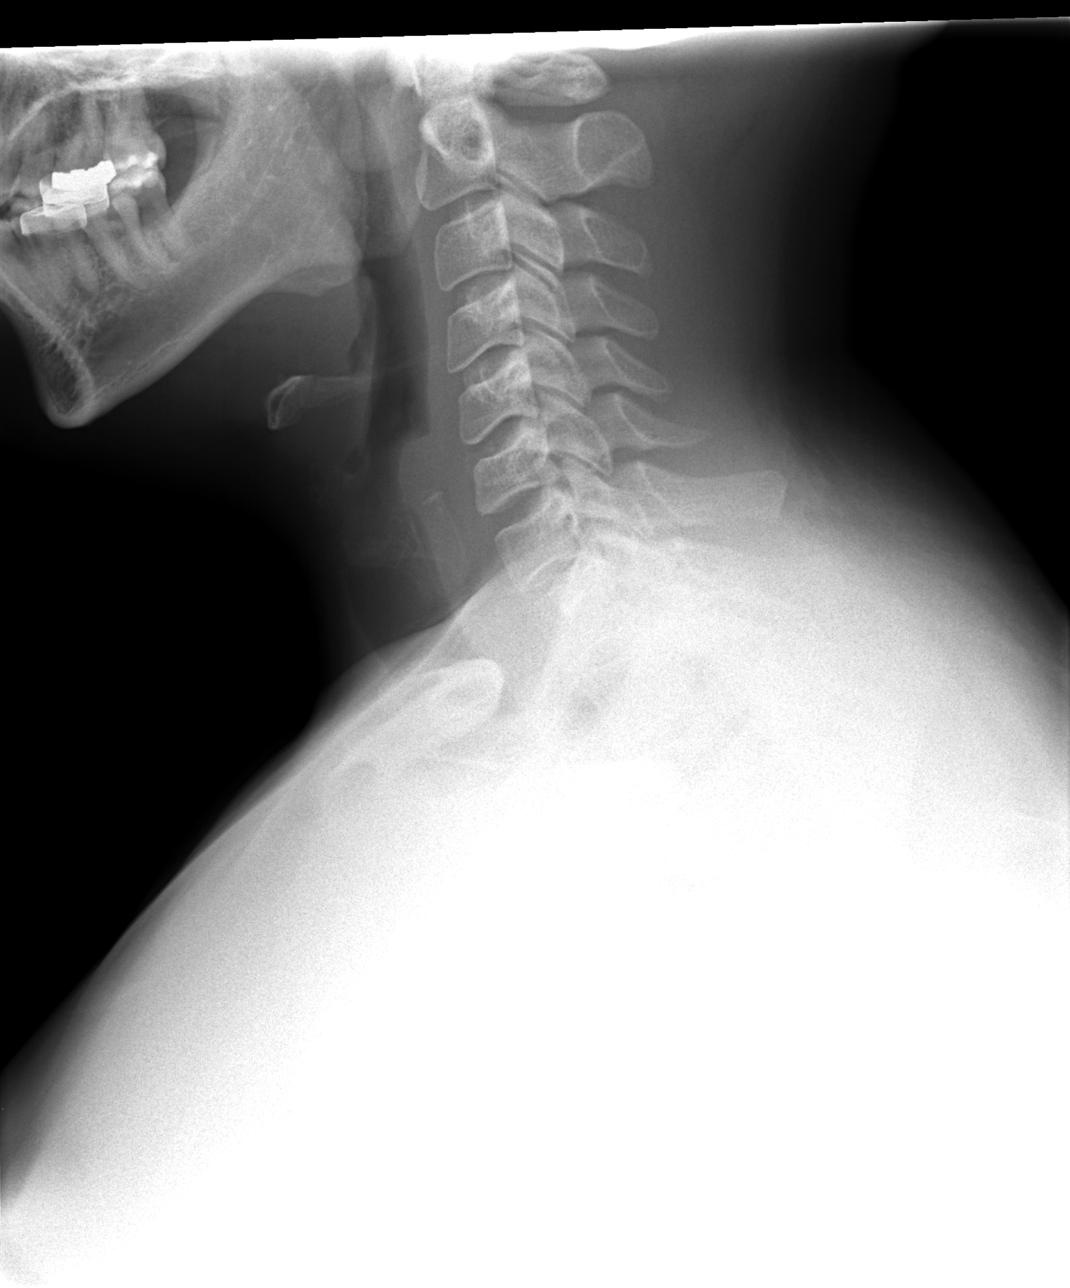

[view not recorded (3 of 3)]
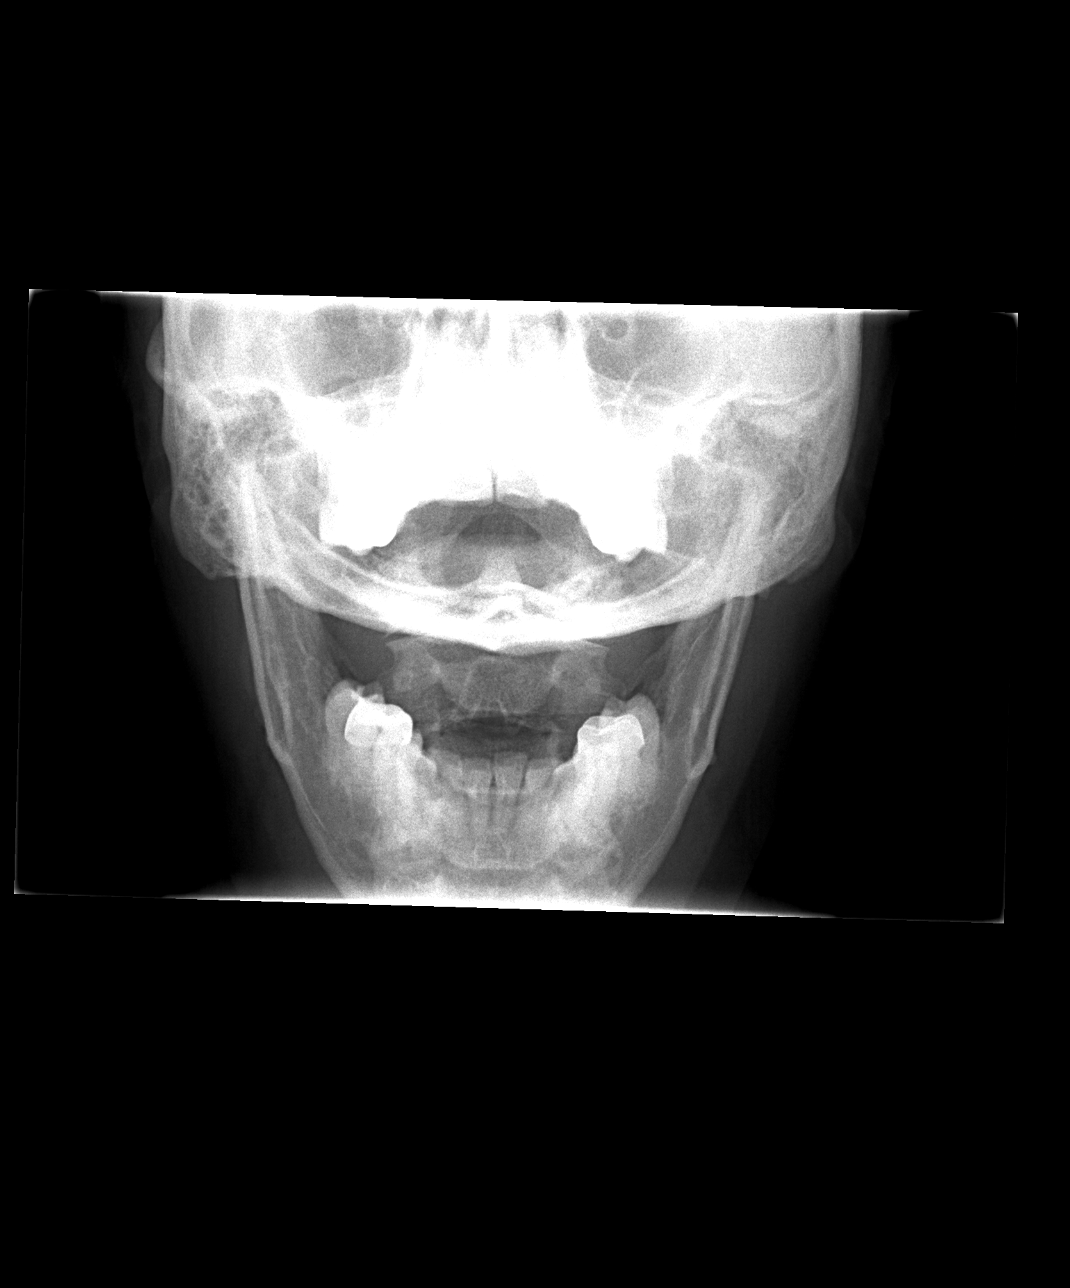

[3 of 3 positions shown; findings below may reference images not displayed]

FINDINGS: The cervical vertebral bodies are preserved in height. The disc
space heights are well maintained. There is no perched facet or
spinous process fracture. The prevertebral soft tissue spaces are
normal. The odontoid is intact where visualized.
IMPRESSION: There is no acute or significant chronic bony abnormality of the
cervical spine.

## 2017-04-15 ENCOUNTER — Telehealth: Payer: Self-pay | Admitting: Internal Medicine

## 2017-04-15 NOTE — Telephone Encounter (Signed)
Pt dropped off Health Screening form to be filled out. Placed in Dr. Marina GoodellScotts folder upfront. Please advise pt needs by Monday

## 2017-04-16 NOTE — Telephone Encounter (Signed)
In your red folder 

## 2017-04-17 NOTE — Telephone Encounter (Signed)
Called patient informed at reception for pick up.

## 2017-04-17 NOTE — Telephone Encounter (Signed)
Form completed and placed in box.  

## 2017-05-15 ENCOUNTER — Ambulatory Visit (INDEPENDENT_AMBULATORY_CARE_PROVIDER_SITE_OTHER): Payer: 59

## 2017-05-15 ENCOUNTER — Ambulatory Visit: Payer: 59

## 2017-05-15 DIAGNOSIS — Z23 Encounter for immunization: Secondary | ICD-10-CM

## 2017-06-11 ENCOUNTER — Encounter: Payer: Self-pay | Admitting: Internal Medicine

## 2017-06-11 ENCOUNTER — Ambulatory Visit: Payer: 59 | Admitting: Internal Medicine

## 2017-06-11 VITALS — BP 132/86 | HR 102 | Temp 98.3°F | Wt 187.8 lb

## 2017-06-11 DIAGNOSIS — Z789 Other specified health status: Secondary | ICD-10-CM

## 2017-06-11 DIAGNOSIS — F329 Major depressive disorder, single episode, unspecified: Secondary | ICD-10-CM | POA: Diagnosis not present

## 2017-06-11 DIAGNOSIS — R739 Hyperglycemia, unspecified: Secondary | ICD-10-CM

## 2017-06-11 DIAGNOSIS — R195 Other fecal abnormalities: Secondary | ICD-10-CM | POA: Diagnosis not present

## 2017-06-11 DIAGNOSIS — K219 Gastro-esophageal reflux disease without esophagitis: Secondary | ICD-10-CM | POA: Diagnosis not present

## 2017-06-11 DIAGNOSIS — K2100 Gastro-esophageal reflux disease with esophagitis, without bleeding: Secondary | ICD-10-CM

## 2017-06-11 DIAGNOSIS — D649 Anemia, unspecified: Secondary | ICD-10-CM

## 2017-06-11 DIAGNOSIS — Z7289 Other problems related to lifestyle: Secondary | ICD-10-CM

## 2017-06-11 DIAGNOSIS — F1090 Alcohol use, unspecified, uncomplicated: Secondary | ICD-10-CM

## 2017-06-11 DIAGNOSIS — Z9109 Other allergy status, other than to drugs and biological substances: Secondary | ICD-10-CM | POA: Diagnosis not present

## 2017-06-11 DIAGNOSIS — F32A Depression, unspecified: Secondary | ICD-10-CM

## 2017-06-11 MED ORDER — PANTOPRAZOLE SODIUM 40 MG PO TBEC: 40.0000 mg | DELAYED_RELEASE_TABLET | Freq: Two times a day (BID) | ORAL | 1 refills | Status: DC

## 2017-06-11 NOTE — Progress Notes (Signed)
Patient ID: Austin [name], male   DOB: 1981/03/14, 36 y.o.   MRN: [id number]   Subjective:    Patient ID: Austin [name], male    DOB: 25-Jul-1980, 35 y.o.   MRN: [id number]  HPI  Patient here for a scheduled follow up.  States he is doing relatively well.  Is having some acid reflux.  Taking protonix bid.  Noticed some break through acid reflux.  No abdominal pain.  No nausea or vomiting.  Bowels moving.  No chest pain.  No sob.  Handling stress relatively well.  Family doing ok.  Has decreased alcohol intake.     Past Medical History:  Diagnosis Date  . Alcohol abuse    history of  . Allergy   . Anxiety   . Depression   . GERD (gastroesophageal reflux disease)   . Heart murmur   . Hypertension   . Ulcer    history of bleeding ulcer requiring blood transfusions   Past Surgical History:  Procedure Laterality Date  . KNEE SURGERY     Left knee - ACL  . TONSILLECTOMY AND ADENOIDECTOMY  1992   Family History  Problem Relation Age of Onset  . Alcohol abuse Father   . Cancer Father        prostate cancer  . Arthritis Sister   . Cancer Maternal Grandmother        breast cancer  . Stroke Maternal Grandmother   . Diabetes Maternal Grandmother   . Hypertension Maternal Grandmother    Social History   Socioeconomic History  . Marital status: Married    Spouse name: None  . Number of children: None  . Years of education: None  . Highest education level: None  Social Needs  . Financial resource strain: None  . Food insecurity - worry: None  . Food insecurity - inability: None  . Transportation needs - medical: None  . Transportation needs - non-medical: None  Occupational History  . None  Tobacco Use  . Smoking status: Current Every Day Smoker    Packs/day: 0.50    Types: Cigarettes  . Smokeless tobacco: Never Used  . Tobacco comment: Quit smoking recently  Substance and Sexual Activity  . Alcohol use: Yes    Alcohol/week: 0.0 oz  . Drug use: Yes    Comment: marijuana   . Sexual activity: None  Other Topics Concern  . None  Social History Narrative  . None    Outpatient Encounter Medications as of 06/11/2017  Medication Sig  . cetirizine (ZYRTEC) 10 MG tablet Take 10 mg by mouth daily.  Marland Kitchen FLUoxetine (PROZAC) 40 MG capsule TAKE 1 CAPSULE BY MOUTH EVERY DAY  . pantoprazole (PROTONIX) 40 MG tablet Take 1 tablet (40 mg total) by mouth 2 (two) times daily.  . ranitidine (ZANTAC) 150 MG tablet Take 150 mg by mouth 2 (two) times daily.  . [DISCONTINUED] pantoprazole (PROTONIX) 40 MG tablet Take 1 tablet (40 mg total) by mouth 2 (two) times daily.   No facility-administered encounter medications on file as of 06/11/2017.     Review of Systems  Constitutional: Negative for appetite change and unexpected weight change.  HENT: Negative for congestion and sinus pressure.   Respiratory: Negative for cough, chest tightness and shortness of breath.   Cardiovascular: Negative for chest pain, palpitations and leg swelling.  Gastrointestinal: Negative for abdominal pain, diarrhea, nausea and vomiting.       Some acid reflux as outlined.    Genitourinary: Negative for  difficulty urinating and dysuria.  Musculoskeletal: Negative for joint swelling and myalgias.  Skin: Negative for color change and rash.  Neurological: Negative for dizziness, light-headedness and headaches.  Psychiatric/Behavioral: Negative for agitation and dysphoric mood.       Objective:     Blood pressure rechecked by me:  128/84, pulse 84  Physical Exam  Constitutional: He appears well-developed and well-nourished. No distress.  HENT:  Nose: Nose normal.  Mouth/Throat: Oropharynx is clear and moist.  Neck: Neck supple. No thyromegaly present.  Cardiovascular: Normal rate and regular rhythm.  Pulmonary/Chest: Effort normal and breath sounds normal. No respiratory distress.  Abdominal: Soft. Bowel sounds are normal. There is no tenderness.  Musculoskeletal: He exhibits no edema or  tenderness.  Lymphadenopathy:    He has no cervical adenopathy.  Skin: No rash noted. No erythema.  Psychiatric: He has a normal mood and affect. His behavior is normal.    BP 132/86 (BP Location: Right Arm, Patient Position: Sitting, Cuff Size: Normal)   Pulse (!) 102   Temp 98.3 F (36.8 C) (Oral)   Wt 187 lb 12.8 oz (85.2 kg)   BMI 31.25 kg/m  Wt Readings from Last 3 Encounters:  06/11/17 187 lb 12.8 oz (85.2 kg)  02/05/17 189 lb 9.6 oz (86 kg)  12/02/16 185 lb (83.9 kg)     Lab Results  Component Value Date   WBC 5.5 11/07/2016   HGB 14.2 11/07/2016   HCT 41.2 11/07/2016   PLT 291 11/07/2016   GLUCOSE 108 (H) 11/07/2016   CHOL 154 11/07/2016   TRIG 76 11/07/2016   HDL 65 11/07/2016   LDLCALC 74 11/07/2016   ALT 22 11/07/2016   AST 21 11/07/2016   NA 141 11/07/2016   K 4.1 11/07/2016   CL 96 11/07/2016   CREATININE 0.95 11/07/2016   BUN 9 11/07/2016   CO2 21 11/07/2016   TSH 0.657 11/07/2016   HGBA1C 5.8 (H) 12/02/2016    Dg Cervical Spine 2 Or 3 Views  Result Date: 06/12/2015 CLINICAL DATA:  Neck pain extending inferiorly into the anterior chest and left shoulder EXAM: CERVICAL SPINE - 2-3 VIEW COMPARISON:  None in PACs FINDINGS: The cervical vertebral bodies are preserved in height. The disc space heights are well maintained. There is no perched facet or spinous process fracture. The prevertebral soft tissue spaces are normal. The odontoid is intact where visualized. IMPRESSION: There is no acute or significant chronic bony abnormality of the cervical spine. Electronically Signed   By: David  [place] M.D.   On: 06/12/2015 16:19       Assessment & Plan:   Problem List Items Addressed This Visit    Alcohol use    Has cut down significantly.  Continue to encourage to remain off.        Anemia    With increased acid reflux, recheck cbc.        Depression    On prozac.  Stable.        Environmental allergies    Controlled on zyrtec.        GERD  (gastroesophageal reflux disease) - Primary    Having increased acid reflux despite taking protonix bid.  Has a h/o gastric ulcer.  Add zantac in the evening.  Refer back to GI.        Relevant Medications   pantoprazole (PROTONIX) 40 MG tablet   Other Relevant Orders   CBC with Differential/Platelet   Ambulatory referral to Gastroenterology    Other  Visit Diagnoses    Loose stools       Relevant Orders   Hepatic function panel   TSH   Basic metabolic panel   Ambulatory referral to Gastroenterology   Hyperglycemia       Relevant Orders   Hemoglobin A1c       Dale DurhamSCOTT, Anela Bensman, MD

## 2017-06-14 ENCOUNTER — Encounter: Payer: Self-pay | Admitting: Internal Medicine

## 2017-06-14 NOTE — Assessment & Plan Note (Signed)
Having increased acid reflux despite taking protonix bid.  Has a h/o gastric ulcer.  Add zantac in the evening.  Refer back to GI.

## 2017-06-14 NOTE — Assessment & Plan Note (Signed)
Controlled on zyrtec 

## 2017-06-14 NOTE — Assessment & Plan Note (Signed)
Has cut down significantly.  Continue to encourage to remain off.

## 2017-06-14 NOTE — Assessment & Plan Note (Signed)
With increased acid reflux, recheck cbc.

## 2017-06-14 NOTE — Assessment & Plan Note (Signed)
On prozac.  Stable.   

## 2017-06-22 ENCOUNTER — Encounter: Payer: Self-pay | Admitting: Internal Medicine

## 2017-06-29 ENCOUNTER — Encounter: Payer: Self-pay | Admitting: Internal Medicine

## 2017-09-14 ENCOUNTER — Telehealth: Payer: Self-pay | Admitting: Internal Medicine

## 2017-09-14 ENCOUNTER — Other Ambulatory Visit: Payer: Self-pay | Admitting: *Deleted

## 2017-09-14 MED ORDER — FLUOXETINE HCL 40 MG PO CAPS: ORAL_CAPSULE | ORAL | 0 refills | Status: DC

## 2017-09-14 NOTE — Telephone Encounter (Signed)
Attempted to contact pt to verify pharmacy information; unable to leave message at [phone]; pre-recorded message states voicemail has not been set up.

## 2017-09-14 NOTE — Telephone Encounter (Signed)
Copied from CRM [phone]. Topic: Quick Communication - Rx Refill/Question >> Sep 14, 2017 10:02 AM Arlyss Gandyichardson, Keni Elison N, NT wrote: Medication:  FLUoxetine [facility](PROZAC)   Has the patient contacted their pharmacy? Yes.     (Agent: If no, request that the patient contact the pharmacy for the refill.)   Preferred Pharmacy (with phone number or street name): [facility]  Ref#: [id number]   Agent: Please be advised that RX refills may take up to 3 business days. We ask that you follow-up with your pharmacy.

## 2017-09-24 ENCOUNTER — Ambulatory Visit (INDEPENDENT_AMBULATORY_CARE_PROVIDER_SITE_OTHER): Payer: BLUE CROSS/BLUE SHIELD | Admitting: Internal Medicine

## 2017-09-24 ENCOUNTER — Encounter: Payer: Self-pay | Admitting: Internal Medicine

## 2017-09-24 VITALS — BP 120/72 | HR 86 | Temp 98.5°F | Resp 18 | Wt 190.4 lb

## 2017-09-24 DIAGNOSIS — F419 Anxiety disorder, unspecified: Secondary | ICD-10-CM | POA: Diagnosis not present

## 2017-09-24 DIAGNOSIS — F32A Depression, unspecified: Secondary | ICD-10-CM

## 2017-09-24 DIAGNOSIS — D649 Anemia, unspecified: Secondary | ICD-10-CM

## 2017-09-24 DIAGNOSIS — F1099 Alcohol use, unspecified with unspecified alcohol-induced disorder: Secondary | ICD-10-CM

## 2017-09-24 DIAGNOSIS — R030 Elevated blood-pressure reading, without diagnosis of hypertension: Secondary | ICD-10-CM

## 2017-09-24 DIAGNOSIS — K2100 Gastro-esophageal reflux disease with esophagitis, without bleeding: Secondary | ICD-10-CM

## 2017-09-24 DIAGNOSIS — F329 Major depressive disorder, single episode, unspecified: Secondary | ICD-10-CM

## 2017-09-24 DIAGNOSIS — Z13220 Encounter for screening for lipoid disorders: Secondary | ICD-10-CM

## 2017-09-24 DIAGNOSIS — R739 Hyperglycemia, unspecified: Secondary | ICD-10-CM | POA: Diagnosis not present

## 2017-09-24 DIAGNOSIS — Z0000 Encounter for general adult medical examination without abnormal findings: Secondary | ICD-10-CM | POA: Diagnosis not present

## 2017-09-24 MED ORDER — PANTOPRAZOLE SODIUM 40 MG PO TBEC: 40.0000 mg | DELAYED_RELEASE_TABLET | Freq: Every day | ORAL | 1 refills | Status: DC

## 2017-09-24 MED ORDER — FLUOXETINE HCL 40 MG PO CAPS: ORAL_CAPSULE | ORAL | 1 refills | Status: DC

## 2017-09-24 NOTE — Progress Notes (Signed)
Patient ID: Austin SinclairCory E Richardson, male   DOB: 1980/11/27, 37 y.o.   MRN: [id number]   Subjective:    Patient ID: Austin [name], male    DOB: 1980/11/27, 37 y.o.   MRN: [id number]  HPI  Patient here for his physical exam.  He reports he is doing relatively well.  Handling stress.  Has been out of his medication.  Both protonix and prozac.  Was told my pharmacy no refill sent in.  Handling stress well.  Has stopped drinking beer.  Stomach much better.  Saw GI.  Was scheduled to have EGD and colonoscopy.  States he cannot afford right now.  He cancelled.  No chest pain.  Breathing stable.  No abdominal pain.  Bowels moving.  Overall he feels he is doing well.     Past Medical History:  Diagnosis Date  . Alcohol abuse    history of  . Allergy   . Anxiety   . Depression   . GERD (gastroesophageal reflux disease)   . Heart murmur   . Hypertension   . Ulcer    history of bleeding ulcer requiring blood transfusions   Past Surgical History:  Procedure Laterality Date  . KNEE SURGERY     Left knee - ACL  . TONSILLECTOMY AND ADENOIDECTOMY  1992   Family History  Problem Relation Age of Onset  . Alcohol abuse Father   . Cancer Father        prostate cancer  . Arthritis Sister   . Cancer Maternal Grandmother        breast cancer  . Stroke Maternal Grandmother   . Diabetes Maternal Grandmother   . Hypertension Maternal Grandmother    Social History   Socioeconomic History  . Marital status: Married    Spouse name: Not on file  . Number of children: Not on file  . Years of education: Not on file  . Highest education level: Not on file  Occupational History  . Not on file  Social Needs  . Financial resource strain: Not on file  . Food insecurity:    Worry: Not on file    Inability: Not on file  . Transportation needs:    Medical: Not on file    Non-medical: Not on file  Tobacco Use  . Smoking status: Current Every Day Smoker    Packs/day: 0.50    Types: Cigarettes  . Smokeless  tobacco: Never Used  . Tobacco comment: Quit smoking recently  Substance and Sexual Activity  . Alcohol use: Yes    Alcohol/week: 0.0 oz  . Drug use: Yes    Comment: marijuana  . Sexual activity: Not on file  Lifestyle  . Physical activity:    Days per week: Not on file    Minutes per session: Not on file  . Stress: Not on file  Relationships  . Social connections:    Talks on phone: Not on file    Gets together: Not on file    Attends religious service: Not on file    Active member of club or organization: Not on file    Attends meetings of clubs or organizations: Not on file    Relationship status: Not on file  Other Topics Concern  . Not on file  Social History Narrative  . Not on file    Outpatient Encounter Medications as of 09/24/2017  Medication Sig  . cetirizine (ZYRTEC) 10 MG tablet Take 10 mg by mouth daily.  . ranitidine (ZANTAC) 150  MG tablet Take 150 mg by mouth 2 (two) times daily.  Marland Kitchen FLUoxetine (PROZAC) 40 MG capsule TAKE 1 CAPSULE BY MOUTH EVERY DAY  . pantoprazole (PROTONIX) 40 MG tablet Take 1 tablet (40 mg total) by mouth daily.  . sucralfate (CARAFATE) 1 g tablet TAKE 1 TABLET (1 G TOTAL) BY MOUTH 2 (TWO) TIMES DAILY BEFORE MEALS  . [DISCONTINUED] FLUoxetine (PROZAC) 40 MG capsule TAKE 1 CAPSULE BY MOUTH EVERY DAY (Patient not taking: Reported on 09/24/2017)  . [DISCONTINUED] pantoprazole (PROTONIX) 40 MG tablet Take 1 tablet (40 mg total) by mouth 2 (two) times daily. (Patient not taking: Reported on 09/24/2017)   No facility-administered encounter medications on file as of 09/24/2017.     Review of Systems  Constitutional: Negative for appetite change and unexpected weight change.  HENT: Negative for congestion and sinus pressure.   Eyes: Negative for pain and visual disturbance.  Respiratory: Negative for cough, chest tightness and shortness of breath.   Cardiovascular: Negative for chest pain, palpitations and leg swelling.  Gastrointestinal: Negative  for abdominal pain, diarrhea, nausea and vomiting.  Genitourinary: Negative for difficulty urinating and dysuria.  Musculoskeletal: Negative for joint swelling and myalgias.  Skin: Negative for color change and rash.  Neurological: Negative for dizziness, light-headedness and headaches.  Hematological: Negative for adenopathy. Does not bruise/bleed easily.  Psychiatric/Behavioral: Negative for agitation and dysphoric mood.       Objective:     Blood pressure rechecked by me:  120/72  Physical Exam  Constitutional: He is oriented to person, place, and time. He appears well-developed and well-nourished. No distress.  HENT:  Head: Normocephalic and atraumatic.  Nose: Nose normal.  Mouth/Throat: Oropharynx is clear and moist. No oropharyngeal exudate.  Eyes: Conjunctivae are normal. Right eye exhibits no discharge. Left eye exhibits no discharge.  Neck: Neck supple. No thyromegaly present.  Cardiovascular: Normal rate and regular rhythm.  Pulmonary/Chest: Breath sounds normal. No respiratory distress. He has no wheezes.  Abdominal: Soft. Bowel sounds are normal. There is no tenderness.  Genitourinary:  Genitourinary Comments: Not performed.   Musculoskeletal: He exhibits no edema or tenderness.  Lymphadenopathy:    He has no cervical adenopathy.  Neurological: He is alert and oriented to person, place, and time.  Skin: Skin is warm and dry. No rash noted. No erythema.  Psychiatric: He has a normal mood and affect. His behavior is normal.    BP 120/72   Pulse 86   Temp 98.5 F (36.9 C) (Oral)   Resp 18   Wt 190 lb 6.4 oz (86.4 kg)   SpO2 98%   BMI 31.68 kg/m  Wt Readings from Last 3 Encounters:  09/24/17 190 lb 6.4 oz (86.4 kg)  06/11/17 187 lb 12.8 oz (85.2 kg)  02/05/17 189 lb 9.6 oz (86 kg)     Lab Results  Component Value Date   WBC 5.5 11/07/2016   HGB 14.2 11/07/2016   HCT 41.2 11/07/2016   PLT 291 11/07/2016   GLUCOSE 108 (H) 11/07/2016   CHOL 154  11/07/2016   TRIG 76 11/07/2016   HDL 65 11/07/2016   LDLCALC 74 11/07/2016   ALT 22 11/07/2016   AST 21 11/07/2016   NA 141 11/07/2016   K 4.1 11/07/2016   CL 96 11/07/2016   CREATININE 0.95 11/07/2016   BUN 9 11/07/2016   CO2 21 11/07/2016   TSH 0.657 11/07/2016   HGBA1C 5.8 (H) 12/02/2016    Dg Cervical Spine 2 Or 3 Views  Result  Date: 06/12/2015 CLINICAL DATA:  Neck pain extending inferiorly into the anterior chest and left shoulder EXAM: CERVICAL SPINE - 2-3 VIEW COMPARISON:  None in PACs FINDINGS: The cervical vertebral bodies are preserved in height. The disc space heights are well maintained. There is no perched facet or spinous process fracture. The prevertebral soft tissue spaces are normal. The odontoid is intact where visualized. IMPRESSION: There is no acute or significant chronic bony abnormality of the cervical spine. Electronically Signed   By: David  [place] M.D.   On: 06/12/2015 16:19       Assessment & Plan:   Problem List Items Addressed This Visit    Alcohol use with alcohol-induced disorder (HCC)    He has stopped drinking beer.  Has significant cut down his alcohol intake.  Follow.        Anemia    Follow cbc.  Saw GI.  Was scheduled for EGD and colonoscopy.  Had to cancel as outlined.  Informed to notify me when he could reschedule.        Relevant Orders   CBC with Differential/Platelet   Anxiety    Has been on prozac.  rx sent in.  Stable.       Relevant Medications   FLUoxetine (PROZAC) 40 MG capsule   Depression    Has been on prozac.  Stable.  rx sent in.       Relevant Medications   FLUoxetine (PROZAC) 40 MG capsule   GERD (gastroesophageal reflux disease)    Saw GI.  Was scheduled for EGD.  Had to cancel.  Symptoms improved.  Has stopped drinking beer.  rx sent in for protonix.  GI placed on carafate.  Follow.       Relevant Medications   sucralfate (CARAFATE) 1 g tablet   pantoprazole (PROTONIX) 40 MG tablet   Other Relevant Orders    Hepatic function panel   TSH   Basic metabolic panel    Other Visit Diagnoses    Routine general medical examination at a health care facility    -  Primary   Screening cholesterol level       Relevant Orders   Lipid panel   Hyperglycemia       Relevant Orders   Hemoglobin A1c   Elevated blood pressure reading       Blood pressure on recheck improved.  Follow.        Dale Valle, MD

## 2017-09-27 ENCOUNTER — Encounter: Payer: Self-pay | Admitting: Internal Medicine

## 2017-09-27 DIAGNOSIS — F1020 Alcohol dependence, uncomplicated: Secondary | ICD-10-CM | POA: Insufficient documentation

## 2017-09-27 DIAGNOSIS — F1099 Alcohol use, unspecified with unspecified alcohol-induced disorder: Secondary | ICD-10-CM | POA: Insufficient documentation

## 2017-09-27 NOTE — Assessment & Plan Note (Signed)
Saw GI.  Was scheduled for EGD.  Had to cancel.  Symptoms improved.  Has stopped drinking beer.  rx sent in for protonix.  GI placed on carafate.  Follow.

## 2017-09-27 NOTE — Assessment & Plan Note (Signed)
Follow cbc.  Saw GI.  Was scheduled for EGD and colonoscopy.  Had to cancel as outlined.  Informed to notify me when he could reschedule.

## 2017-09-27 NOTE — Assessment & Plan Note (Signed)
Has been on prozac.  Stable.  rx sent in.

## 2017-09-27 NOTE — Assessment & Plan Note (Signed)
He has stopped drinking beer.  Has significant cut down his alcohol intake.  Follow.

## 2017-09-27 NOTE — Assessment & Plan Note (Signed)
Has been on prozac.  rx sent in.  Stable.

## 2018-02-02 ENCOUNTER — Encounter: Payer: Self-pay | Admitting: Internal Medicine

## 2018-02-02 ENCOUNTER — Ambulatory Visit (INDEPENDENT_AMBULATORY_CARE_PROVIDER_SITE_OTHER): Payer: BLUE CROSS/BLUE SHIELD | Admitting: Internal Medicine

## 2018-02-02 VITALS — BP 130/78 | HR 70 | Temp 98.0°F | Resp 18 | Wt 183.4 lb

## 2018-02-02 DIAGNOSIS — Z13220 Encounter for screening for lipoid disorders: Secondary | ICD-10-CM

## 2018-02-02 DIAGNOSIS — K2100 Gastro-esophageal reflux disease with esophagitis, without bleeding: Secondary | ICD-10-CM

## 2018-02-02 DIAGNOSIS — F329 Major depressive disorder, single episode, unspecified: Secondary | ICD-10-CM

## 2018-02-02 DIAGNOSIS — D649 Anemia, unspecified: Secondary | ICD-10-CM

## 2018-02-02 DIAGNOSIS — F1099 Alcohol use, unspecified with unspecified alcohol-induced disorder: Secondary | ICD-10-CM

## 2018-02-02 DIAGNOSIS — R739 Hyperglycemia, unspecified: Secondary | ICD-10-CM

## 2018-02-02 DIAGNOSIS — F419 Anxiety disorder, unspecified: Secondary | ICD-10-CM

## 2018-02-02 DIAGNOSIS — F32A Depression, unspecified: Secondary | ICD-10-CM

## 2018-02-02 MED ORDER — PANTOPRAZOLE SODIUM 40 MG PO TBEC: 40.0000 mg | DELAYED_RELEASE_TABLET | Freq: Two times a day (BID) | ORAL | 1 refills | Status: DC

## 2018-02-02 NOTE — Progress Notes (Signed)
Patient ID: Austin SinclairCory E [name], male   DOB: [birth year], 37 y.o.   MRN: [id number]   Subjective:    Patient ID: Austin SinclairCory E [name], male    DOB: [birth year], 37 y.o.   MRN: [id number]  HPI  Patient here for a scheduled follow up.  He reports he is doing relatively well.  Increased stress with his job.  Overall he feels he is handling things relatively well.  Has adjusted diet.  Doing weight watchers.  Has lost weight.  No chest pain.  No sob.  No acid reflux.  Taking his medication regularly.  No abdominal pain.  Bowels moving.  States does drink a beer a day.  Discussed stopping.  Was off.  Plans to decrease.  Have discussed importance of not mixing alcohol and his antidepressant.     Past Medical History:  Diagnosis Date  . Alcohol abuse    history of  . Allergy   . Anxiety   . Depression   . GERD (gastroesophageal reflux disease)   . Heart murmur   . Hypertension   . Ulcer    history of bleeding ulcer requiring blood transfusions   Past Surgical History:  Procedure Laterality Date  . KNEE SURGERY     Left knee - ACL  . TONSILLECTOMY AND ADENOIDECTOMY  1992   Family History  Problem Relation Age of Onset  . Alcohol abuse Father   . Cancer Father        prostate cancer  . Arthritis Sister   . Cancer Maternal Grandmother        breast cancer  . Stroke Maternal Grandmother   . Diabetes Maternal Grandmother   . Hypertension Maternal Grandmother    Social History   Socioeconomic History  . Marital status: Married    Spouse name: Not on file  . Number of children: Not on file  . Years of education: Not on file  . Highest education level: Not on file  Occupational History  . Not on file  Social Needs  . Financial resource strain: Not on file  . Food insecurity:    Worry: Not on file    Inability: Not on file  . Transportation needs:    Medical: Not on file    Non-medical: Not on file  Tobacco Use  . Smoking status: Current Every Day Smoker    Packs/day: 0.50    Types:  Cigarettes  . Smokeless tobacco: Never Used  . Tobacco comment: Quit smoking recently  Substance and Sexual Activity  . Alcohol use: Yes    Alcohol/week: 0.0 oz  . Drug use: Yes    Comment: marijuana  . Sexual activity: Not on file  Lifestyle  . Physical activity:    Days per week: Not on file    Minutes per session: Not on file  . Stress: Not on file  Relationships  . Social connections:    Talks on phone: Not on file    Gets together: Not on file    Attends religious service: Not on file    Active member of club or organization: Not on file    Attends meetings of clubs or organizations: Not on file    Relationship status: Not on file  Other Topics Concern  . Not on file  Social History Narrative  . Not on file    Outpatient Encounter Medications as of 02/02/2018  Medication Sig  . cetirizine (ZYRTEC) 10 MG tablet Take 10 mg by mouth daily.  Marland Kitchen. FLUoxetine (PROZAC)  40 MG capsule TAKE 1 CAPSULE BY MOUTH EVERY DAY  . pantoprazole (PROTONIX) 40 MG tablet Take 1 tablet (40 mg total) by mouth 2 (two) times daily before a meal.  . ranitidine (ZANTAC) 150 MG tablet Take 150 mg by mouth 2 (two) times daily.  . sucralfate (CARAFATE) 1 g tablet TAKE 1 TABLET (1 G TOTAL) BY MOUTH 2 (TWO) TIMES DAILY BEFORE MEALS  . [DISCONTINUED] pantoprazole (PROTONIX) 40 MG tablet Take 1 tablet (40 mg total) by mouth daily.   No facility-administered encounter medications on file as of 02/02/2018.     Review of Systems  Constitutional: Negative for appetite change and unexpected weight change.       Has adjusted diet.  Lost weight.   HENT: Negative for congestion and sinus pressure.   Respiratory: Negative for cough, chest tightness and shortness of breath.   Cardiovascular: Negative for chest pain, palpitations and leg swelling.  Gastrointestinal: Negative for abdominal pain, diarrhea, nausea and vomiting.  Genitourinary: Negative for difficulty urinating and dysuria.  Musculoskeletal: Negative for  joint swelling and myalgias.  Skin: Negative for color change and rash.  Neurological: Negative for dizziness, light-headedness and headaches.  Psychiatric/Behavioral: Negative for agitation and dysphoric mood.       Objective:    Physical Exam  Constitutional: He appears well-developed and well-nourished. No distress.  HENT:  Nose: Nose normal.  Mouth/Throat: Oropharynx is clear and moist.  Neck: Neck supple. No thyromegaly present.  Cardiovascular: Normal rate and regular rhythm.  Pulmonary/Chest: Effort normal and breath sounds normal. No respiratory distress.  Abdominal: Soft. Bowel sounds are normal. There is no tenderness.  Musculoskeletal: He exhibits no edema or tenderness.  Lymphadenopathy:    He has no cervical adenopathy.  Skin: No rash noted. No erythema.  Psychiatric: He has a normal mood and affect. His behavior is normal.    BP 130/78 (BP Location: Left Arm, Patient Position: Sitting, Cuff Size: Normal)   Pulse 70   Temp 98 F (36.7 C) (Oral)   Resp 18   Wt 183 lb 6.4 oz (83.2 kg)   SpO2 97%   BMI 30.52 kg/m  Wt Readings from Last 3 Encounters:  02/02/18 183 lb 6.4 oz (83.2 kg)  09/24/17 190 lb 6.4 oz (86.4 kg)  06/11/17 187 lb 12.8 oz (85.2 kg)     Lab Results  Component Value Date   WBC 5.5 11/07/2016   HGB 14.2 11/07/2016   HCT 41.2 11/07/2016   PLT 291 11/07/2016   GLUCOSE 108 (H) 11/07/2016   CHOL 154 11/07/2016   TRIG 76 11/07/2016   HDL 65 11/07/2016   LDLCALC 74 11/07/2016   ALT 22 11/07/2016   AST 21 11/07/2016   NA 141 11/07/2016   K 4.1 11/07/2016   CL 96 11/07/2016   CREATININE 0.95 11/07/2016   BUN 9 11/07/2016   CO2 21 11/07/2016   TSH 0.657 11/07/2016   HGBA1C 5.8 (H) 12/02/2016    Dg Cervical Spine 2 Or 3 Views  Result Date: 06/12/2015 CLINICAL DATA:  Neck pain extending inferiorly into the anterior chest and left shoulder EXAM: CERVICAL SPINE - 2-3 VIEW COMPARISON:  None in PACs FINDINGS: The cervical vertebral bodies  are preserved in height. The disc space heights are well maintained. There is no perched facet or spinous process fracture. The prevertebral soft tissue spaces are normal. The odontoid is intact where visualized. IMPRESSION: There is no acute or significant chronic bony abnormality of the cervical spine. Electronically Signed  By: David  [place] M.D.   On: 06/12/2015 16:19       Assessment & Plan:   Problem List Items Addressed This Visit    Alcohol use with alcohol-induced disorder (HCC)    Had stopped.  Now drinks one beer a night.  Discussed with him today.  Discussed the need to stop.  Also discussed not mixing alcohol with antidepressant.  Follow.        Anemia - Primary    Follow cbc.  Was scheduled for EGD and colonoscopy.  Had to cancel.  Unable to afford at this time.  Will notify when agreeable.  Upper symptoms controlled on current regimen.        Relevant Orders   CBC with Differential/Platelet   Hepatic function panel   TSH   Basic metabolic panel   Anxiety    On prozac.  Stable.  Follow.        Depression    Stable on prozac.  Follow.       GERD (gastroesophageal reflux disease)    Controlled on current regimen.  Follow.        Relevant Medications   pantoprazole (PROTONIX) 40 MG tablet    Other Visit Diagnoses    Screening cholesterol level       Relevant Orders   Lipid panel   Hyperglycemia       Relevant Orders   Hemoglobin A1c       Dale Waymart, MD

## 2018-02-03 ENCOUNTER — Encounter: Payer: Self-pay | Admitting: Internal Medicine

## 2018-02-03 LAB — CBC WITH DIFFERENTIAL/PLATELET
BASOS ABS: 0 10*3/uL (ref 0.0–0.2)
Basos: 0 %
EOS (ABSOLUTE): 0.3 10*3/uL (ref 0.0–0.4)
Eos: 5 %
HEMOGLOBIN: 14.2 g/dL (ref 13.0–17.7)
Hematocrit: 41.3 % (ref 37.5–51.0)
Immature Grans (Abs): 0 10*3/uL (ref 0.0–0.1)
Immature Granulocytes: 0 %
LYMPHS ABS: 2.3 10*3/uL (ref 0.7–3.1)
Lymphs: 37 %
MCH: 30 pg (ref 26.6–33.0)
MCHC: 34.4 g/dL (ref 31.5–35.7)
MCV: 87 fL (ref 79–97)
Monocytes Absolute: 0.5 10*3/uL (ref 0.1–0.9)
Monocytes: 8 %
NEUTROS ABS: 3 10*3/uL (ref 1.4–7.0)
Neutrophils: 50 %
Platelets: 324 10*3/uL (ref 150–450)
RBC: 4.74 x10E6/uL (ref 4.14–5.80)
RDW: 14.2 % (ref 12.3–15.4)
WBC: 6.1 10*3/uL (ref 3.4–10.8)

## 2018-02-03 LAB — HEPATIC FUNCTION PANEL
ALT: 21 IU/L (ref 0–44)
AST: 19 IU/L (ref 0–40)
Albumin: 4.8 g/dL (ref 3.5–5.5)
Alkaline Phosphatase: 78 IU/L (ref 39–117)
BILIRUBIN TOTAL: 0.5 mg/dL (ref 0.0–1.2)
Bilirubin, Direct: 0.14 mg/dL (ref 0.00–0.40)
Total Protein: 7.1 g/dL (ref 6.0–8.5)

## 2018-02-03 LAB — BASIC METABOLIC PANEL
BUN/Creatinine Ratio: 9 (ref 9–20)
BUN: 9 mg/dL (ref 6–20)
CALCIUM: 9.4 mg/dL (ref 8.7–10.2)
CO2: 25 mmol/L (ref 20–29)
CREATININE: 1.02 mg/dL (ref 0.76–1.27)
Chloride: 101 mmol/L (ref 96–106)
GFR calc Af Amer: 109 mL/min/{1.73_m2} (ref >59)
GFR, EST NON AFRICAN AMERICAN: 94 mL/min/{1.73_m2} (ref >59)
Glucose: 93 mg/dL (ref 65–99)
Potassium: 4.4 mmol/L (ref 3.5–5.2)
Sodium: 141 mmol/L (ref 134–144)

## 2018-02-03 LAB — LIPID PANEL
Chol/HDL Ratio: 2.7 ratio (ref 0.0–5.0)
Cholesterol, Total: 169 mg/dL (ref 100–199)
HDL: 62 mg/dL (ref >39)
LDL Calculated: 84 mg/dL (ref 0–99)
TRIGLYCERIDES: 114 mg/dL (ref 0–149)
VLDL Cholesterol Cal: 23 mg/dL (ref 5–40)

## 2018-02-03 LAB — HEMOGLOBIN A1C
ESTIMATED AVERAGE GLUCOSE: 120 mg/dL
HEMOGLOBIN A1C: 5.8 % — AB (ref 4.8–5.6)

## 2018-02-03 LAB — TSH: TSH: 0.696 u[IU]/mL (ref 0.450–4.500)

## 2018-02-03 NOTE — Assessment & Plan Note (Signed)
Had stopped.  Now drinks one beer a night.  Discussed with him today.  Discussed the need to stop.  Also discussed not mixing alcohol with antidepressant.  Follow.

## 2018-02-03 NOTE — Assessment & Plan Note (Signed)
Controlled on current regimen.  Follow.  

## 2018-02-03 NOTE — Assessment & Plan Note (Signed)
Stable on prozac.  Follow.   

## 2018-02-03 NOTE — Assessment & Plan Note (Signed)
Follow cbc.  Was scheduled for EGD and colonoscopy.  Had to cancel.  Unable to afford at this time.  Will notify when agreeable.  Upper symptoms controlled on current regimen.

## 2018-02-03 NOTE — Assessment & Plan Note (Signed)
On prozac.  Stable.  Follow.  

## 2018-02-18 DIAGNOSIS — M79672 Pain in left foot: Secondary | ICD-10-CM | POA: Diagnosis not present

## 2018-02-18 DIAGNOSIS — D2372 Other benign neoplasm of skin of left lower limb, including hip: Secondary | ICD-10-CM | POA: Diagnosis not present

## 2018-02-19 ENCOUNTER — Other Ambulatory Visit: Payer: Self-pay | Admitting: Internal Medicine

## 2018-02-19 DIAGNOSIS — K2100 Gastro-esophageal reflux disease with esophagitis, without bleeding: Secondary | ICD-10-CM

## 2018-02-22 ENCOUNTER — Other Ambulatory Visit: Payer: Self-pay | Admitting: Internal Medicine

## 2018-03-10 ENCOUNTER — Other Ambulatory Visit: Payer: Self-pay | Admitting: Internal Medicine

## 2018-03-10 DIAGNOSIS — K2100 Gastro-esophageal reflux disease with esophagitis, without bleeding: Secondary | ICD-10-CM

## 2018-03-15 ENCOUNTER — Encounter: Payer: Self-pay | Admitting: Internal Medicine

## 2018-03-16 ENCOUNTER — Other Ambulatory Visit: Payer: Self-pay

## 2018-03-16 DIAGNOSIS — K2100 Gastro-esophageal reflux disease with esophagitis, without bleeding: Secondary | ICD-10-CM

## 2018-03-16 NOTE — Telephone Encounter (Signed)
He has a script for 180 with 1 refill on his protonix that was sent in on 02/02/2018 so I will call the pharmacy.  Does he need an office visit to complete form? He was just seen 01/2018

## 2018-03-17 NOTE — Telephone Encounter (Signed)
No office visit needed since I just saw him.  He can drop off form.  Also, please check on his rx to [facility]

## 2018-03-18 ENCOUNTER — Other Ambulatory Visit: Payer: Self-pay

## 2018-03-18 DIAGNOSIS — K2100 Gastro-esophageal reflux disease with esophagitis, without bleeding: Secondary | ICD-10-CM

## 2018-03-18 MED ORDER — PANTOPRAZOLE SODIUM 40 MG PO TBEC: 40.0000 mg | DELAYED_RELEASE_TABLET | Freq: Two times a day (BID) | ORAL | 1 refills | Status: DC

## 2018-03-30 ENCOUNTER — Encounter: Payer: Self-pay | Admitting: Internal Medicine

## 2018-04-02 ENCOUNTER — Ambulatory Visit (INDEPENDENT_AMBULATORY_CARE_PROVIDER_SITE_OTHER): Payer: BLUE CROSS/BLUE SHIELD

## 2018-04-02 ENCOUNTER — Telehealth: Payer: Self-pay | Admitting: Internal Medicine

## 2018-04-02 DIAGNOSIS — Z23 Encounter for immunization: Secondary | ICD-10-CM | POA: Diagnosis not present

## 2018-04-02 NOTE — Telephone Encounter (Signed)
Pt dropped off [employer] screening form to be filled out. Placed in Dr. Roby Lofts colored folder upfront  Please advise pt when ready

## 2018-04-02 NOTE — Telephone Encounter (Signed)
Form placed in red folder  

## 2018-04-06 NOTE — Telephone Encounter (Signed)
Form completed and placed in box.  

## 2018-04-06 NOTE — Telephone Encounter (Signed)
Left voicemail to call , form ready for pick up. Will place at front desk for pick up

## 2018-06-10 ENCOUNTER — Encounter: Payer: Self-pay | Admitting: Internal Medicine

## 2018-06-10 ENCOUNTER — Ambulatory Visit (INDEPENDENT_AMBULATORY_CARE_PROVIDER_SITE_OTHER): Payer: BLUE CROSS/BLUE SHIELD | Admitting: Internal Medicine

## 2018-06-10 VITALS — BP 128/78 | HR 67 | Temp 97.9°F | Resp 16 | Wt 181.8 lb

## 2018-06-10 DIAGNOSIS — K2100 Gastro-esophageal reflux disease with esophagitis, without bleeding: Secondary | ICD-10-CM

## 2018-06-10 DIAGNOSIS — R739 Hyperglycemia, unspecified: Secondary | ICD-10-CM | POA: Diagnosis not present

## 2018-06-10 DIAGNOSIS — F419 Anxiety disorder, unspecified: Secondary | ICD-10-CM

## 2018-06-10 DIAGNOSIS — D649 Anemia, unspecified: Secondary | ICD-10-CM | POA: Diagnosis not present

## 2018-06-10 DIAGNOSIS — M5442 Lumbago with sciatica, left side: Secondary | ICD-10-CM

## 2018-06-10 DIAGNOSIS — M5441 Lumbago with sciatica, right side: Secondary | ICD-10-CM

## 2018-06-10 MED ORDER — PANTOPRAZOLE SODIUM 40 MG PO TBEC: 40.0000 mg | DELAYED_RELEASE_TABLET | Freq: Every day | ORAL | 1 refills | Status: DC

## 2018-06-10 MED ORDER — PANTOPRAZOLE SODIUM 40 MG PO TBEC: 40.0000 mg | DELAYED_RELEASE_TABLET | Freq: Every day | ORAL | 0 refills | Status: DC

## 2018-06-10 NOTE — Patient Instructions (Signed)
protonix 40mg  - take one tablet 30 minutes before breakfast  Pepcid 20mg  - take one tablet 30 minutes before your evening meal  Examples of probiotics:  Florastor, culturelle or align.  Take daily.

## 2018-06-10 NOTE — Progress Notes (Signed)
Patient ID: Austin [name], male   DOB: Nov 12, 1980, 37 y.o.   MRN: [id number]   Subjective:    Patient ID: Austin [name], male    DOB: Nov 16, 1980, 37 y.o.   MRN: [id number]  HPI  Patient here for a scheduled follow up.  He reports he is doing relatively well.  Still with increased stress.  Increased stress with his job and some family issues.  Discussed with him today.  Does not feel needs any further intervention.  Tries to stay active.  Discussed exercise. No chest pain.  No sob.  Increased acid reflux.  Has been out of his protonix.  Taking TUMS and pepto bismol.  No abdominal pain.  Some low back ian with pain down to his knees.  Present for months.     Past Medical History:  Diagnosis Date  . Alcohol abuse    history of  . Allergy   . Anxiety   . Depression   . GERD (gastroesophageal reflux disease)   . Heart murmur   . Hypertension   . Ulcer    history of bleeding ulcer requiring blood transfusions   Past Surgical History:  Procedure Laterality Date  . KNEE SURGERY     Left knee - ACL  . TONSILLECTOMY AND ADENOIDECTOMY  1992   Family History  Problem Relation Age of Onset  . Alcohol abuse Father   . Cancer Father        prostate cancer  . Arthritis Sister   . Cancer Maternal Grandmother        breast cancer  . Stroke Maternal Grandmother   . Diabetes Maternal Grandmother   . Hypertension Maternal Grandmother    Social History   Socioeconomic History  . Marital status: Married    Spouse name: Not on file  . Number of children: Not on file  . Years of education: Not on file  . Highest education level: Not on file  Occupational History  . Not on file  Social Needs  . Financial resource strain: Not on file  . Food insecurity:    Worry: Not on file    Inability: Not on file  . Transportation needs:    Medical: Not on file    Non-medical: Not on file  Tobacco Use  . Smoking status: Current Every Day [name]    Packs/day: 0.50    Types: Cigarettes  . Smokeless  tobacco: Never Used  . Tobacco comment: Quit smoking recently  Substance and Sexual Activity  . Alcohol use: Yes    Alcohol/week: 0.0 standard drinks  . Drug use: Yes    Comment: marijuana  . Sexual activity: Not on file  Lifestyle  . Physical activity:    Days per week: Not on file    Minutes per session: Not on file  . Stress: Not on file  Relationships  . Social connections:    Talks on phone: Not on file    Gets together: Not on file    Attends religious service: Not on file    Active member of club or organization: Not on file    Attends meetings of clubs or organizations: Not on file    Relationship status: Not on file  Other Topics Concern  . Not on file  Social History Narrative  . Not on file    Outpatient Encounter Medications as of 06/10/2018  Medication Sig  . cetirizine (ZYRTEC) 10 MG tablet Take 10 mg by mouth daily.  Marland Kitchen FLUoxetine (PROZAC) 40  MG capsule TAKE 1 CAPSULE BY MOUTH  EVERY DAY  . pantoprazole (PROTONIX) 40 MG tablet Take 1 tablet (40 mg total) by mouth daily.  . sucralfate (CARAFATE) 1 g tablet TAKE 1 TABLET (1 G TOTAL) BY MOUTH 2 (TWO) TIMES DAILY BEFORE MEALS  . [DISCONTINUED] pantoprazole (PROTONIX) 40 MG tablet Take 1 tablet (40 mg total) by mouth 2 (two) times daily before a meal.  . [DISCONTINUED] pantoprazole (PROTONIX) 40 MG tablet Take 1 tablet (40 mg total) by mouth daily.  . [DISCONTINUED] ranitidine (ZANTAC) 150 MG tablet Take 150 mg by mouth 2 (two) times daily.   No facility-administered encounter medications on file as of 06/10/2018.     Review of Systems  Constitutional: Negative for appetite change and unexpected weight change.  HENT: Negative for congestion and sinus pressure.   Respiratory: Negative for cough, chest tightness and shortness of breath.   Cardiovascular: Negative for chest pain, palpitations and leg swelling.  Gastrointestinal: Negative for diarrhea, nausea and vomiting.       Acid reflux.    Genitourinary:  Negative for difficulty urinating and dysuria.  Musculoskeletal: Positive for back pain. Negative for joint swelling and myalgias.  Skin: Negative for color change and rash.  Neurological: Negative for dizziness, light-headedness and headaches.  Psychiatric/Behavioral: Negative for agitation and dysphoric mood.       Objective:    Physical Exam Constitutional:      General: He is not in acute distress.    Appearance: Normal appearance. He is well-developed.  HENT:     Nose: Nose normal. No congestion.     Mouth/Throat:     Pharynx: No oropharyngeal exudate or posterior oropharyngeal erythema.  Cardiovascular:     Rate and Rhythm: Normal rate and regular rhythm.  Pulmonary:     Effort: Pulmonary effort is normal. No respiratory distress.     Breath sounds: Normal breath sounds.  Abdominal:     General: Bowel sounds are normal.     Palpations: Abdomen is soft.     Tenderness: There is no abdominal tenderness.  Musculoskeletal:        General: No swelling or tenderness.     Comments: Able to sit, stand and walk without difficulty.  Some low back pain with SLR.    Neurological:     Mental Status: He is alert.  Psychiatric:        Mood and Affect: Mood normal.        Behavior: Behavior normal.     BP 128/78 (BP Location: Left Arm, Patient Position: Sitting, Cuff Size: Normal)   Pulse 67   Temp 97.9 F (36.6 C) (Oral)   Resp 16   Wt 181 lb 12.8 oz (82.5 kg)   SpO2 99%   BMI 30.25 kg/m  Wt Readings from Last 3 Encounters:  06/10/18 181 lb 12.8 oz (82.5 kg)  02/02/18 183 lb 6.4 oz (83.2 kg)  09/24/17 190 lb 6.4 oz (86.4 kg)     Lab Results  Component Value Date   WBC 6.1 02/02/2018   HGB 14.2 02/02/2018   HCT 41.3 02/02/2018   PLT 324 02/02/2018   GLUCOSE 93 02/02/2018   CHOL 169 02/02/2018   TRIG 114 02/02/2018   HDL 62 02/02/2018   LDLCALC 84 02/02/2018   ALT 21 02/02/2018   AST 19 02/02/2018   NA 141 02/02/2018   K 4.4 02/02/2018   CL 101 02/02/2018    CREATININE 1.02 02/02/2018   BUN 9 02/02/2018   CO2  25 02/02/2018   TSH 0.696 02/02/2018   HGBA1C 5.8 (H) 02/02/2018       Assessment & Plan:   Problem List Items Addressed This Visit    Anemia    Follow cbc.  Was scheduled for EGD and colonoscopy.  Had to cancel.  Unable to afford.  With increased acid reflux.  Get him back on protonix.  Will notify me when able to proceed.        Relevant Orders   CBC with Differential/Platelet   Lipid panel   Basic metabolic panel   Anxiety    Doing well on prozac.  Follow.        GERD (gastroesophageal reflux disease)    Increased acid reflux.  Has been off protonix.  Restart.  Follow symptoms closely.  Saw GI as outlined.,        Relevant Medications   pantoprazole (PROTONIX) 40 MG tablet   Low back pain    Desires no further intervention at this time.  Stretches/exercise.  Tylenol if needed.  Follow.         Other Visit Diagnoses    Hyperglycemia    -  Primary   Relevant Orders   Hemoglobin A1c   Hepatic function panel       Dale Bristol, MD

## 2018-06-20 ENCOUNTER — Encounter: Payer: Self-pay | Admitting: Internal Medicine

## 2018-06-20 DIAGNOSIS — M545 Low back pain, unspecified: Secondary | ICD-10-CM | POA: Insufficient documentation

## 2018-06-20 NOTE — Assessment & Plan Note (Signed)
Desires no further intervention at this time.  Stretches/exercise.  Tylenol if needed.  Follow.

## 2018-06-20 NOTE — Assessment & Plan Note (Signed)
Doing well on prozac.  Follow  

## 2018-06-20 NOTE — Assessment & Plan Note (Signed)
Increased acid reflux.  Has been off protonix.  Restart.  Follow symptoms closely.  Saw GI as outlined.,

## 2018-06-20 NOTE — Assessment & Plan Note (Signed)
Follow cbc.  Was scheduled for EGD and colonoscopy.  Had to cancel.  Unable to afford.  With increased acid reflux.  Get him back on protonix.  Will notify me when able to proceed.

## 2018-06-21 ENCOUNTER — Telehealth: Payer: Self-pay | Admitting: *Deleted

## 2018-06-21 DIAGNOSIS — D649 Anemia, unspecified: Secondary | ICD-10-CM

## 2018-06-21 DIAGNOSIS — Z13220 Encounter for screening for lipoid disorders: Secondary | ICD-10-CM

## 2018-06-21 DIAGNOSIS — R739 Hyperglycemia, unspecified: Secondary | ICD-10-CM

## 2018-06-21 NOTE — Telephone Encounter (Signed)
Pt coming in for labs tomorrow morning. Please place future orders. 

## 2018-06-21 NOTE — Telephone Encounter (Signed)
Patient in office to pick up copy.

## 2018-06-21 NOTE — Telephone Encounter (Signed)
Copied from CRM [phone]. Topic: General - Other >> Jun 21, 2018  9:27 AM Gerrianne ScalePayne, Angela L wrote: Reason for CRM: pt calling wanting a copy of his immunizations today please give him a call back at [phone]

## 2018-06-22 ENCOUNTER — Other Ambulatory Visit: Payer: BLUE CROSS/BLUE SHIELD

## 2018-06-22 NOTE — Telephone Encounter (Signed)
Orders placed for labs

## 2018-08-09 ENCOUNTER — Telehealth: Payer: Self-pay

## 2018-08-09 ENCOUNTER — Ambulatory Visit: Payer: BLUE CROSS/BLUE SHIELD | Admitting: Internal Medicine

## 2018-08-09 NOTE — Telephone Encounter (Signed)
Copied from CRM [phone]. Topic: Quick Communication - Appointment Cancellation >> Aug 09, 2018  9:54 AM Jay SchlichterWeikart, Melissa J wrote: Patient called to cancel appointment scheduled for 4pm . Patient has not rescheduled their appointment. Pt got a new job and can not get off work,  and will call back when his schedule allows.    Route to department's PEC pool.

## 2018-08-09 NOTE — Telephone Encounter (Signed)
noted 

## 2018-10-26 ENCOUNTER — Encounter: Payer: Self-pay | Admitting: Internal Medicine

## 2018-10-26 ENCOUNTER — Other Ambulatory Visit: Payer: Self-pay

## 2018-10-26 MED ORDER — FLUOXETINE HCL 40 MG PO CAPS: 40.0000 mg | ORAL_CAPSULE | Freq: Every day | ORAL | 1 refills | Status: DC

## 2018-11-04 ENCOUNTER — Other Ambulatory Visit: Payer: Self-pay

## 2018-11-04 ENCOUNTER — Encounter: Payer: Self-pay | Admitting: Internal Medicine

## 2018-11-04 MED ORDER — FLUOXETINE HCL 40 MG PO CAPS: 40.0000 mg | ORAL_CAPSULE | Freq: Every day | ORAL | 1 refills | Status: DC

## 2018-11-25 ENCOUNTER — Encounter: Payer: Self-pay | Admitting: Internal Medicine

## 2018-12-09 ENCOUNTER — Encounter: Payer: Self-pay | Admitting: Internal Medicine

## 2018-12-09 ENCOUNTER — Ambulatory Visit: Payer: No Typology Code available for payment source | Admitting: Internal Medicine

## 2018-12-09 ENCOUNTER — Other Ambulatory Visit: Payer: Self-pay

## 2018-12-09 DIAGNOSIS — K219 Gastro-esophageal reflux disease without esophagitis: Secondary | ICD-10-CM

## 2018-12-09 NOTE — Progress Notes (Deleted)
Patient ID: Austin SinclairCory E [name], male   DOB: [birth year], 38 y.o.   MRN: [id number]   Virtual Visit via video Note  This visit type was conducted due to national recommendations for restrictions regarding the COVID-19 pandemic (e.g. social distancing).  This format is felt to be most appropriate for this patient at this time.  All issues noted in this document were discussed and addressed.  No physical exam was performed (except for noted visual exam findings with Video Visits).   I connected with Austin [name] by a video enabled telemedicine application or telephone and verified that I am speaking with the correct person using two identifiers. Location patient: home Location provider: work  Persons participating in the virtual visit: patient, provider  I discussed the limitations, risks, security and privacy concerns of performing an evaluation and management service by video and the availability of in person appointments. . The patient expressed understanding and agreed to proceed.   Reason for visit: scheduled follow up.   HPI: ***   ROS: See pertinent positives and negatives per HPI.  Past Medical History:  Diagnosis Date  . Alcohol abuse    history of  . Allergy   . Anxiety   . Depression   . GERD (gastroesophageal reflux disease)   . Heart murmur   . Hypertension   . Ulcer    history of bleeding ulcer requiring blood transfusions    Past Surgical History:  Procedure Laterality Date  . KNEE SURGERY     Left knee - ACL  . TONSILLECTOMY AND ADENOIDECTOMY  1992    Family History  Problem Relation Age of Onset  . Alcohol abuse Father   . Cancer Father        prostate cancer  . Arthritis Sister   . Cancer Maternal Grandmother        breast cancer  . Stroke Maternal Grandmother   . Diabetes Maternal Grandmother   . Hypertension Maternal Grandmother     SOCIAL HX: ***   Current Outpatient Medications:  .  cetirizine (ZYRTEC) 10 MG tablet, Take 10 mg by mouth daily.,  Disp: , Rfl:  .  FLUoxetine (PROZAC) 40 MG capsule, Take 1 capsule (40 mg total) by mouth daily., Disp: 90 capsule, Rfl: 1 .  pantoprazole (PROTONIX) 40 MG tablet, Take 1 tablet (40 mg total) by mouth daily., Disp: 90 tablet, Rfl: 1 .  sucralfate (CARAFATE) 1 g tablet, TAKE 1 TABLET (1 G TOTAL) BY MOUTH 2 (TWO) TIMES DAILY BEFORE MEALS, Disp: , Rfl: 3  EXAM:  VITALS per patient if applicable:  GENERAL: alert, oriented, appears well and in no acute distress  HEENT: atraumatic, conjunttiva clear, no obvious abnormalities on inspection of external nose and ears  NECK: normal movements of the head and neck  LUNGS: on inspection no signs of respiratory distress, breathing rate appears normal, no obvious gross SOB, gasping or wheezing  CV: no obvious cyanosis  MS: moves all visible extremities without noticeable abnormality  PSYCH/NEURO: pleasant and cooperative, no obvious depression or anxiety, speech and thought processing grossly intact  ASSESSMENT AND PLAN:  Discussed the following assessment and plan:  No problem-specific Assessment & Plan notes found for this encounter.    I discussed the assessment and treatment plan with the patient. The patient was provided an opportunity to ask questions and all were answered. The patient agreed with the plan and demonstrated an understanding of the instructions.   The patient was advised to call back or seek an in-person  evaluation if the symptoms worsen or if the condition fails to improve as anticipated.  I provided *** minutes of non-face-to-face time during this encounter.   Einar Pheasant, MD

## 2018-12-12 ENCOUNTER — Encounter: Payer: Self-pay | Admitting: Internal Medicine

## 2018-12-12 NOTE — Assessment & Plan Note (Signed)
Acid reflux controlled on protonix.   

## 2018-12-14 ENCOUNTER — Other Ambulatory Visit: Payer: Self-pay

## 2018-12-14 ENCOUNTER — Ambulatory Visit (INDEPENDENT_AMBULATORY_CARE_PROVIDER_SITE_OTHER): Payer: No Typology Code available for payment source | Admitting: Internal Medicine

## 2018-12-14 DIAGNOSIS — D649 Anemia, unspecified: Secondary | ICD-10-CM

## 2018-12-14 DIAGNOSIS — Z13220 Encounter for screening for lipoid disorders: Secondary | ICD-10-CM

## 2018-12-14 DIAGNOSIS — R739 Hyperglycemia, unspecified: Secondary | ICD-10-CM

## 2018-12-14 DIAGNOSIS — M79673 Pain in unspecified foot: Secondary | ICD-10-CM

## 2018-12-14 DIAGNOSIS — F419 Anxiety disorder, unspecified: Secondary | ICD-10-CM

## 2018-12-14 DIAGNOSIS — M545 Low back pain, unspecified: Secondary | ICD-10-CM

## 2018-12-14 DIAGNOSIS — K219 Gastro-esophageal reflux disease without esophagitis: Secondary | ICD-10-CM

## 2018-12-14 NOTE — Progress Notes (Signed)
Patient ID: Austin [name], male   DOB: 11-05-1980, 38 y.o.   MRN: [id number]   Virtual Visit via telephone Note  This visit type was conducted due to national recommendations for restrictions regarding the COVID-19 pandemic (e.g. social distancing).  This format is felt to be most appropriate for this patient at this time.  All issues noted in this document were discussed and addressed.  No physical exam was performed (except for noted visual exam findings with Video Visits).   I connected with  [name] by telephone and verified that I am speaking with the correct person using two identifiers. Location patient: home Location provider: work Persons participating in the virtual visit: patient, provider  I discussed the limitations, risks, security and privacy concerns of performing an evaluation and management service by telephone and the availability of in person appointments.   The patient expressed understanding and agreed to proceed.   Reason for visit: scheduled follow up.    HPI: He reports he is doing relatively well.  Has changed jobs.  Less stress.  On prozac and doing well on this medication.  Still with increased stress, but feels he is handling things relatively well.  Trying to stay active.  No chest pain.  No sob.  No significant acid reflux.  Taking protonix and carafate and feels symptoms are controlled.  Is having low back pain.  Persistent.  Has worsened recently.  Localized to low back and extends  Into his buttocks - bilateral.  No pain radiating down leg.  No numbness or tingling.  No abdominal pain.  Bowels moving.  Discussed again with him regarding GI evaluation given previous documented anemia.  He declines.  Will notify me when agreeable.  Also reports painful are present on his foot.  Has seen Dr Vickki Muff.  Request referral back to Dr Vickki Muff.     ROS: See pertinent positives and negatives per HPI.  Past Medical History:  Diagnosis Date  . Alcohol abuse    history of   . Allergy   . Anxiety   . Depression   . GERD (gastroesophageal reflux disease)   . Heart murmur   . Hypertension   . Ulcer    history of bleeding ulcer requiring blood transfusions    Past Surgical History:  Procedure Laterality Date  . KNEE SURGERY     Left knee - ACL  . TONSILLECTOMY AND ADENOIDECTOMY  1992    Family History  Problem Relation Age of Onset  . Alcohol abuse Father   . Cancer Father        prostate cancer  . Arthritis Sister   . Cancer Maternal Grandmother        breast cancer  . Stroke Maternal Grandmother   . Diabetes Maternal Grandmother   . Hypertension Maternal Grandmother     SOCIAL HX: reviewed.    Current Outpatient Medications:  .  cetirizine (ZYRTEC) 10 MG tablet, Take 10 mg by mouth daily., Disp: , Rfl:  .  FLUoxetine (PROZAC) 40 MG capsule, Take 1 capsule (40 mg total) by mouth daily., Disp: 90 capsule, Rfl: 1 .  pantoprazole (PROTONIX) 40 MG tablet, Take 1 tablet (40 mg total) by mouth daily., Disp: 90 tablet, Rfl: 1 .  sucralfate (CARAFATE) 1 g tablet, TAKE 1 TABLET (1 G TOTAL) BY MOUTH 2 (TWO) TIMES DAILY BEFORE MEALS, Disp: , Rfl: 3  EXAM:  GENERAL: alert.  Answering questions appropriately.  Sounds to be in no acute distress.    PSYCH/NEURO:  pleasant and cooperative, no obvious depression or anxiety, speech and thought processing grossly intact  ASSESSMENT AND PLAN:  Discussed the following assessment and plan:  Anemia Recheck cbc with next labs.  Was scheduled for EGD and colonoscopy.  He cancelled.  States was unable to afford.  Taking protonix and carafate now.  States acid reflux relatively well controlled.  Discussed f/u with GI for question of need for EGD/colonoscopy with h/o anemia.  He declines.  Will notify me if he changes his mind.    Anxiety Doing well on prozac.  Follow.    GERD (gastroesophageal reflux disease) Relatively well controlled on protonix and carafate.  Follow.    Low back pain Persistent and  increased low back pain as outlined.  No pain radiating into his legs.  Does extend - bilateral buttocks.  Takes tylenol.  Check xray.  Further w/up pending results.    Foot pain Recurring soreness on foot as outlined.  Request referral back to Dr Ether GriffinsFowler.      I discussed the assessment and treatment plan with the patient. The patient was provided an opportunity to ask questions and all were answered. The patient agreed with the plan and demonstrated an understanding of the instructions.   The patient was advised to call back or seek an in-person evaluation if the symptoms worsen or if the condition fails to improve as anticipated.  I provided 15 minutes of non-face-to-face time during this encounter.   Dale Durhamharlene Mitchel Delduca, MD

## 2018-12-15 ENCOUNTER — Encounter: Payer: Self-pay | Admitting: Internal Medicine

## 2018-12-15 DIAGNOSIS — M79673 Pain in unspecified foot: Secondary | ICD-10-CM | POA: Insufficient documentation

## 2018-12-15 NOTE — Assessment & Plan Note (Signed)
Recurring soreness on foot as outlined.  Request referral back to Dr Vickki Muff.

## 2018-12-15 NOTE — Assessment & Plan Note (Signed)
Recheck cbc with next labs.  Was scheduled for EGD and colonoscopy.  He cancelled.  States was unable to afford.  Taking protonix and carafate now.  States acid reflux relatively well controlled.  Discussed f/u with GI for question of need for EGD/colonoscopy with h/o anemia.  He declines.  Will notify me if he changes his mind.

## 2018-12-15 NOTE — Assessment & Plan Note (Signed)
Doing well on prozac.  Follow  

## 2018-12-15 NOTE — Assessment & Plan Note (Signed)
Relatively well controlled on protonix and carafate.  Follow.

## 2018-12-15 NOTE — Assessment & Plan Note (Signed)
Persistent and increased low back pain as outlined.  No pain radiating into his legs.  Does extend - bilateral buttocks.  Takes tylenol.  Check xray.  Further w/up pending results.

## 2018-12-17 ENCOUNTER — Other Ambulatory Visit: Payer: Self-pay | Admitting: Internal Medicine

## 2018-12-17 DIAGNOSIS — K2100 Gastro-esophageal reflux disease with esophagitis, without bleeding: Secondary | ICD-10-CM

## 2018-12-20 ENCOUNTER — Telehealth: Payer: Self-pay

## 2018-12-20 NOTE — Telephone Encounter (Signed)
Copied from [place] [id number]. Topic: General - Other >> Dec 20, 2018  9:20 AM Reyne Dumas L wrote: Reason for CRM:  Pt states he is returning a call to update insurance information.  Per [facility] send CRM.

## 2018-12-20 NOTE — Telephone Encounter (Signed)
I have entered the patient's insurance information.

## 2019-01-04 ENCOUNTER — Telehealth: Payer: Self-pay | Admitting: *Deleted

## 2019-01-04 ENCOUNTER — Other Ambulatory Visit: Payer: Self-pay

## 2019-01-04 NOTE — Telephone Encounter (Signed)
Pt sis not come to his 8am lab appt. He frequently no shows for his lab appt. Since he works at [employer] it may be better for him to have his orders sent there & have him go there since he doesn't need an appt at [place].

## 2019-01-11 NOTE — Telephone Encounter (Signed)
Patient is not a [employer] employee anymore. Do you mind calling him and seeing if he will reschedule his labs?

## 2019-01-11 NOTE — Telephone Encounter (Signed)
Called and scheduled pt for labs on 7/17 @10 :45

## 2019-01-13 NOTE — Addendum Note (Signed)
Addended by: Leeanne Rio on: 01/13/2019 03:30 PM   Modules accepted: Orders

## 2019-01-14 ENCOUNTER — Other Ambulatory Visit
Admission: RE | Admit: 2019-01-14 | Discharge: 2019-01-14 | Disposition: A | Discharge status: Home / Self Care | Payer: No Typology Code available for payment source | Source: Ambulatory Visit | Attending: Internal Medicine | Admitting: Internal Medicine

## 2019-01-14 ENCOUNTER — Other Ambulatory Visit: Payer: No Typology Code available for payment source

## 2019-01-14 DIAGNOSIS — Z13220 Encounter for screening for lipoid disorders: Secondary | ICD-10-CM | POA: Diagnosis present

## 2019-01-14 DIAGNOSIS — K219 Gastro-esophageal reflux disease without esophagitis: Secondary | ICD-10-CM | POA: Insufficient documentation

## 2019-01-14 DIAGNOSIS — R739 Hyperglycemia, unspecified: Secondary | ICD-10-CM | POA: Diagnosis present

## 2019-01-14 DIAGNOSIS — D649 Anemia, unspecified: Secondary | ICD-10-CM | POA: Insufficient documentation

## 2019-01-14 LAB — BASIC METABOLIC PANEL
Anion gap: 9 (ref 5–15)
BUN: 8 mg/dL (ref 6–20)
CO2: 29 mmol/L (ref 22–32)
Calcium: 9.3 mg/dL (ref 8.9–10.3)
Chloride: 104 mmol/L (ref 98–111)
Creatinine, Ser: 0.81 mg/dL (ref 0.61–1.24)
GFR calc Af Amer: >60 mL/min (ref >60)
GFR calc non Af Amer: >60 mL/min (ref >60)
Glucose, Bld: 101 mg/dL — ABNORMAL HIGH (ref 70–99)
Potassium: 4.1 mmol/L (ref 3.5–5.1)
Sodium: 142 mmol/L (ref 135–145)

## 2019-01-14 LAB — CBC WITH DIFFERENTIAL/PLATELET
Abs Immature Granulocytes: 0.02 10*3/uL (ref 0.00–0.07)
Basophils Absolute: 0 10*3/uL (ref 0.0–0.1)
Basophils Relative: 0 %
Eosinophils Absolute: 0.4 10*3/uL (ref 0.0–0.5)
Eosinophils Relative: 5 %
HCT: 43.1 % (ref 39.0–52.0)
Hemoglobin: 14.8 g/dL (ref 13.0–17.0)
Immature Granulocytes: 0 %
Lymphocytes Relative: 39 %
Lymphs Abs: 2.8 10*3/uL (ref 0.7–4.0)
MCH: 30 pg (ref 26.0–34.0)
MCHC: 34.3 g/dL (ref 30.0–36.0)
MCV: 87.2 fL (ref 80.0–100.0)
Monocytes Absolute: 0.6 10*3/uL (ref 0.1–1.0)
Monocytes Relative: 8 %
Neutro Abs: 3.4 10*3/uL (ref 1.7–7.7)
Neutrophils Relative %: 48 %
Platelets: 298 10*3/uL (ref 150–400)
RBC: 4.94 MIL/uL (ref 4.22–5.81)
RDW: 13 % (ref 11.5–15.5)
WBC: 7.1 10*3/uL (ref 4.0–10.5)
nRBC: 0 % (ref 0.0–0.2)

## 2019-01-14 LAB — LIPID PANEL
Cholesterol: 184 mg/dL (ref 0–200)
HDL: 72 mg/dL (ref >40)
LDL Cholesterol: 106 mg/dL — ABNORMAL HIGH (ref 0–99)
Total CHOL/HDL Ratio: 2.6 RATIO
Triglycerides: 29 mg/dL (ref <150)
VLDL: 6 mg/dL (ref 0–40)

## 2019-01-14 LAB — HEPATIC FUNCTION PANEL
ALT: 31 U/L (ref 0–44)
AST: 27 U/L (ref 15–41)
Albumin: 4.5 g/dL (ref 3.5–5.0)
Alkaline Phosphatase: 72 U/L (ref 38–126)
Bilirubin, Direct: <0.1 mg/dL (ref 0.0–0.2)
Total Bilirubin: 0.7 mg/dL (ref 0.3–1.2)
Total Protein: 7.8 g/dL (ref 6.5–8.1)

## 2019-01-14 LAB — HEMOGLOBIN A1C
Hgb A1c MFr Bld: 5.9 % — ABNORMAL HIGH (ref 4.8–5.6)
Mean Plasma Glucose: 122.63 mg/dL

## 2019-01-14 LAB — FERRITIN: Ferritin: 22 ng/mL — ABNORMAL LOW (ref 24–336)

## 2019-01-14 NOTE — Addendum Note (Signed)
Addended by: Loraine Freid J on: 01/14/2019 08:31 AM   Modules accepted: Orders  

## 2019-01-14 NOTE — Addendum Note (Signed)
Addended by: Nadra Hritz J on: 01/14/2019 08:31 AM   Modules accepted: Orders  

## 2019-01-14 NOTE — Addendum Note (Signed)
Addended by: Tashala Cumbo J on: 01/14/2019 08:31 AM   Modules accepted: Orders  

## 2019-01-14 NOTE — Addendum Note (Signed)
Addended by: Ronin Rehfeldt J on: 01/14/2019 08:31 AM   Modules accepted: Orders  

## 2019-01-14 NOTE — Addendum Note (Signed)
Addended by: Lattie Haw on: 01/14/2019 08:31 AM   Modules accepted: Orders

## 2019-01-14 NOTE — Addendum Note (Signed)
Addended by: Kaliel Bolds J on: 01/14/2019 08:31 AM   Modules accepted: Orders  

## 2019-02-04 ENCOUNTER — Telehealth: Payer: Self-pay | Admitting: *Deleted

## 2019-02-04 NOTE — Telephone Encounter (Signed)
I see where this referral was submitted but podiatry reporting to patient no referrral in place.   General 12/15/2018 3:05 PM Tuck, Austin [name] - -  Note   Submitted to [facility] through [facility] [id number]

## 2019-02-04 NOTE — Telephone Encounter (Signed)
Copied from [place] [phone]. Topic: Referral - Status >> Feb 04, 2019 11:14 AM Alanda Slim E wrote: Reason for CRM: Pt called [facility] and was advised they have no referral from PCP. Pt called to check status of podiatry referral/ please advise

## 2019-04-21 ENCOUNTER — Ambulatory Visit (INDEPENDENT_AMBULATORY_CARE_PROVIDER_SITE_OTHER): Payer: No Typology Code available for payment source | Admitting: Internal Medicine

## 2019-04-21 ENCOUNTER — Other Ambulatory Visit: Payer: Self-pay

## 2019-04-21 DIAGNOSIS — K219 Gastro-esophageal reflux disease without esophagitis: Secondary | ICD-10-CM | POA: Diagnosis not present

## 2019-04-21 DIAGNOSIS — F329 Major depressive disorder, single episode, unspecified: Secondary | ICD-10-CM | POA: Diagnosis not present

## 2019-04-21 DIAGNOSIS — Z720 Tobacco use: Secondary | ICD-10-CM

## 2019-04-21 DIAGNOSIS — F1099 Alcohol use, unspecified with unspecified alcohol-induced disorder: Secondary | ICD-10-CM | POA: Diagnosis not present

## 2019-04-21 DIAGNOSIS — F32A Depression, unspecified: Secondary | ICD-10-CM

## 2019-04-21 NOTE — Progress Notes (Signed)
Patient ID: Austin [name], male   DOB: 1980-12-26, 38 y.o.   MRN: [id number]   Virtual Visit via video Note  This visit type was conducted due to national recommendations for restrictions regarding the COVID-19 pandemic (e.g. social distancing).  This format is felt to be most appropriate for this patient at this time.  All issues noted in this document were discussed and addressed.  No physical exam was performed (except for noted visual exam findings with Video Visits).   I connected with Austin [name] by a video enabled telemedicine application and verified that I am speaking with the correct person using two identifiers. Location patient: home Location provider: work Persons participating in the virtual visit: patient, provider  I discussed the limitations, risks, security and privacy concerns of performing an evaluation and management service by video and the availability of in person appointments.  The patient expressed understanding and agreed to proceed.   Reason for visit: scheduled follow up.    HPI: He reports he is doing relatively well.  New job is less stressful.  He feels he is handling things well.  Trying to stay active.  No chest pain.  No sob.  No acid reflux.  No abdominal pain. Bowels moving.  Has decided to quit smoking.  Feels needs something to help with his mood - since quitting.  Is on prozac.  Has done well with this, but states not sure if can tell a difference now being on the medication.  Discussed the possibility of wellbutrin - given wanting to quit smoking, etc.  He is agreeable for change.  States otherwise he feels he is doing well.     ROS: See pertinent positives and negatives per HPI.  Past Medical History:  Diagnosis Date  . Alcohol abuse    history of  . Allergy   . Anxiety   . Depression   . GERD (gastroesophageal reflux disease)   . Heart murmur   . Hypertension   . Ulcer    history of bleeding ulcer requiring blood transfusions    Past  Surgical History:  Procedure Laterality Date  . KNEE SURGERY     Left knee - ACL  . TONSILLECTOMY AND ADENOIDECTOMY  1992    Family History  Problem Relation Age of Onset  . Alcohol abuse Father   . Cancer Father        prostate cancer  . Arthritis Sister   . Cancer Maternal Grandmother        breast cancer  . Stroke Maternal Grandmother   . Diabetes Maternal Grandmother   . Hypertension Maternal Grandmother     SOCIAL HX: reviewed.    Current Outpatient Medications:  .  cetirizine (ZYRTEC) 10 MG tablet, Take 10 mg by mouth daily., Disp: , Rfl:  .  FLUoxetine (PROZAC) 40 MG capsule, Take 1 capsule (40 mg total) by mouth daily., Disp: 90 capsule, Rfl: 1 .  pantoprazole (PROTONIX) 40 MG tablet, TAKE 1 TABLET (40 MG TOTAL) BY MOUTH 2 (TWO) TIMES DAILY., Disp: 180 tablet, Rfl: 0 .  sucralfate (CARAFATE) 1 g tablet, TAKE 1 TABLET (1 G TOTAL) BY MOUTH 2 (TWO) TIMES DAILY BEFORE MEALS, Disp: , Rfl: 3  EXAM:  GENERAL: alert, oriented, appears well and in no acute distress  HEENT: atraumatic, conjunttiva clear, no obvious abnormalities on inspection of external nose and ears  NECK: normal movements of the head and neck  LUNGS: on inspection no signs of respiratory distress, breathing rate appears normal, no  obvious gross SOB, gasping or wheezing  CV: no obvious cyanosis  PSYCH/NEURO: pleasant and cooperative, no obvious depression or anxiety, speech and thought processing grossly intact  ASSESSMENT AND PLAN:  Discussed the following assessment and plan:  Tobacco abuse He has decided to quit.  Stop smoking yesterday.  Discussed treatment.  Desires wellbutrin.  Will taper off prozac.  Start wellbutrin and titrate up as directed.  Follow.    GERD (gastroesophageal reflux disease) Controlled.  Follow.    Depression Has been on prozac.  Desires to stop smoking.  Not sure if feels different on prozac.  Will decrease prozac to 20mg  q day x one week and then stop.  Start  wellbutrin 150 sr for one week and then increase to bid.  (overall taper of prozac with 150mg  of wellbutrin for one week).  Follow.    Alcohol use with alcohol-induced disorder ([place]) Have discussed the need to stop drinking.  Follow.      I discussed the assessment and treatment plan with the patient. The patient was provided an opportunity to ask questions and all were answered. The patient agreed with the plan and demonstrated an understanding of the instructions.   The patient was advised to call back or seek an in-person evaluation if the symptoms worsen or if the condition fails to improve as anticipated.   Austin Pheasant, MD

## 2019-04-23 ENCOUNTER — Encounter: Payer: Self-pay | Admitting: Internal Medicine

## 2019-04-23 ENCOUNTER — Telehealth: Payer: Self-pay | Admitting: Internal Medicine

## 2019-04-23 MED ORDER — BUPROPION HCL ER (SR) 150 MG PO TB12: ORAL_TABLET | ORAL | 1 refills | Status: DC

## 2019-04-23 MED ORDER — FLUOXETINE HCL 20 MG PO TABS: 20.0000 mg | ORAL_TABLET | Freq: Every day | ORAL | 0 refills | Status: DC

## 2019-04-23 NOTE — Assessment & Plan Note (Signed)
He has decided to quit.  Stop smoking yesterday.  Discussed treatment.  Desires wellbutrin.  Will taper off prozac.  Start wellbutrin and titrate up as directed.  Follow.

## 2019-04-23 NOTE — Assessment & Plan Note (Signed)
Have discussed the need to stop drinking.  Follow.

## 2019-04-23 NOTE — Telephone Encounter (Signed)
Discussed with pt.  Decrease prozac to 20mg  q day for one week.  Start wellbutrin sr 150mg  q day x 1 week and then bid.  No history of seizures.

## 2019-04-23 NOTE — Assessment & Plan Note (Signed)
Has been on prozac.  Desires to stop smoking.  Not sure if feels different on prozac.  Will decrease prozac to 20mg  q day x one week and then stop.  Start wellbutrin 150 sr for one week and then increase to bid.  (overall taper of prozac with 150mg  of wellbutrin for one week).  Follow.

## 2019-04-23 NOTE — Assessment & Plan Note (Signed)
Controlled.  Follow.   

## 2019-04-25 ENCOUNTER — Other Ambulatory Visit: Payer: Self-pay | Admitting: Internal Medicine

## 2019-04-26 ENCOUNTER — Ambulatory Visit (INDEPENDENT_AMBULATORY_CARE_PROVIDER_SITE_OTHER): Payer: No Typology Code available for payment source

## 2019-04-26 ENCOUNTER — Other Ambulatory Visit: Payer: Self-pay

## 2019-04-26 DIAGNOSIS — Z23 Encounter for immunization: Secondary | ICD-10-CM

## 2019-04-27 ENCOUNTER — Encounter: Payer: Self-pay | Admitting: Internal Medicine

## 2019-05-16 ENCOUNTER — Encounter: Payer: Self-pay | Admitting: Internal Medicine

## 2019-06-01 ENCOUNTER — Other Ambulatory Visit: Payer: Self-pay | Admitting: Internal Medicine

## 2019-06-01 DIAGNOSIS — K2100 Gastro-esophageal reflux disease with esophagitis, without bleeding: Secondary | ICD-10-CM

## 2019-06-01 MED ORDER — PANTOPRAZOLE SODIUM 40 MG PO TBEC: DELAYED_RELEASE_TABLET | ORAL | 0 refills | Status: DC

## 2019-07-05 ENCOUNTER — Encounter: Payer: Self-pay | Admitting: Internal Medicine

## 2019-07-06 NOTE — Telephone Encounter (Signed)
Are you ok with him switching back to his fluoxetine? He has been out of his bupropion for 2 days and does not want to continue.

## 2019-07-06 NOTE — Telephone Encounter (Signed)
Confirm he is doing ok.  Confirm if feels appt needed.  Ok to start back on prozac 20mg  q day - if doing ok.  If any concerns let me know.  Make sure has f/u scheduled.

## 2019-07-07 ENCOUNTER — Other Ambulatory Visit: Payer: Self-pay

## 2019-07-07 MED ORDER — FLUOXETINE HCL 20 MG PO TABS: 20.0000 mg | ORAL_TABLET | Freq: Every day | ORAL | 2 refills | Status: DC

## 2019-07-07 NOTE — Telephone Encounter (Signed)
Called pt and scheduled Doxy appt

## 2019-07-13 ENCOUNTER — Other Ambulatory Visit: Payer: Self-pay

## 2019-07-13 ENCOUNTER — Encounter: Payer: Self-pay | Admitting: Internal Medicine

## 2019-07-13 ENCOUNTER — Ambulatory Visit (INDEPENDENT_AMBULATORY_CARE_PROVIDER_SITE_OTHER): Payer: No Typology Code available for payment source | Admitting: Internal Medicine

## 2019-07-13 VITALS — Ht 64.0 in | Wt 181.0 lb

## 2019-07-13 DIAGNOSIS — K219 Gastro-esophageal reflux disease without esophagitis: Secondary | ICD-10-CM

## 2019-07-13 DIAGNOSIS — D649 Anemia, unspecified: Secondary | ICD-10-CM | POA: Diagnosis not present

## 2019-07-13 DIAGNOSIS — R739 Hyperglycemia, unspecified: Secondary | ICD-10-CM

## 2019-07-13 DIAGNOSIS — F419 Anxiety disorder, unspecified: Secondary | ICD-10-CM

## 2019-07-13 DIAGNOSIS — Z13220 Encounter for screening for lipoid disorders: Secondary | ICD-10-CM

## 2019-07-13 DIAGNOSIS — R1030 Lower abdominal pain, unspecified: Secondary | ICD-10-CM

## 2019-07-13 NOTE — Progress Notes (Signed)
Patient ID: Austin [name], male   DOB: 1981/01/16, 39 y.o.   MRN: [id number]   Virtual Visit via video Note  This visit type was conducted due to national recommendations for restrictions regarding the COVID-19 pandemic (e.g. social distancing).  This format is felt to be most appropriate for this patient at this time.  All issues noted in this document were discussed and addressed.  No physical exam was performed (except for noted visual exam findings with Video Visits).   I connected with Austin [name] by a video enabled telemedicine application and verified that I am speaking with the correct person using two identifiers. Location patient: home Location provider: work  Persons participating in the virtual visit: patient, provider  The limitations, risks, security and privacy concerns of performing an evaluation and management service by video and the availability of in person appointments have been discussed.   The patient expressed understanding and agreed to proceed.   Reason for visit: scheduled follow up.   HPI: He reports he is doing relatively well.  Was previously placed on wellbutrin to help stop smoking.  Did not feel like wellbutrin helped as much as prozac.  Back on prozac now.  Feels better back on prozac.  Has stopped smoking.  Has not smoked for 3 months.  Trying to stay active.  No chest pain.  No acid reflux - better.  Does report has noticed left lower side pain.  Having bowel movement - resolves.  May go to the bathroom 5x/day.  No fever.  No increased cough or congestion reported.     ROS: See pertinent positives and negatives per HPI.  Past Medical History:  Diagnosis Date  . Alcohol abuse    history of  . Allergy   . Anxiety   . Depression   . GERD (gastroesophageal reflux disease)   . Heart murmur   . Hypertension   . Ulcer    history of bleeding ulcer requiring blood transfusions    Past Surgical History:  Procedure Laterality Date  . KNEE SURGERY     Left  knee - ACL  . TONSILLECTOMY AND ADENOIDECTOMY  1992    Family History  Problem Relation Age of Onset  . Alcohol abuse Father   . Cancer Father        prostate cancer  . Arthritis Sister   . Cancer Maternal Grandmother        breast cancer  . Stroke Maternal Grandmother   . Diabetes Maternal Grandmother   . Hypertension Maternal Grandmother     SOCIAL HX: reviewed.    Current Outpatient Medications:  .  FLUoxetine (PROZAC) 20 MG tablet, Take 1 tablet (20 mg total) by mouth daily., Disp: 30 tablet, Rfl: 2 .  pantoprazole (PROTONIX) 40 MG tablet, TAKE 1 TABLET (40 MG TOTAL) BY MOUTH 2 (TWO) TIMES DAILY., Disp: 180 tablet, Rfl: 0  EXAM:  GENERAL: alert, oriented, appears well and in no acute distress  HEENT: atraumatic, conjunttiva clear, no obvious abnormalities on inspection of external nose and ears  NECK: normal movements of the head and neck  LUNGS: on inspection no signs of respiratory distress, breathing rate appears normal, no obvious gross SOB, gasping or wheezing  CV: no obvious cyanosis  PSYCH/NEURO: pleasant and cooperative, no obvious depression or anxiety, speech and thought processing grossly intact  ASSESSMENT AND PLAN:  Discussed the following assessment and plan:  Anemia Was previously scheduled for EGD and colonoscopy.  He previously canceled.  Given history of anemia  and given intermittent pain and bowel issues as outlined, will refer to GI for his colonoscopy.  Has reflux.  Controlled.  D/w GI questionable need for EGD.  Follow cbc.   Anxiety Doing well on prozac.  Back on prozac.  Doing well.    GERD (gastroesophageal reflux disease) Controlled on protonix.  Follow.    Lower abdominal pain Left lower abdominal pain as outlined.  Check routine labs.  Discussed CT scan.  Will refer to GI for further evaluation.  Due colonoscopy.  Has a history of gastric ulcer.  Reflux controlled on protonix.  Will hold on CT.  Refer to GI - for further evaluation.   Pt in agreement.     Orders Placed This Encounter  Procedures  . CBC with Differential/Platelet  . Hemoglobin A1c  . Hepatic function panel  . Ferritin  . Lipid panel  . TSH  . Basic metabolic panel (today)  . Ambulatory referral to Gastroenterology    Referral Priority:   Routine    Referral Type:   Consultation    Referral Reason:   Specialty Services Required    Number of Visits Requested:   1     I discussed the assessment and treatment plan with the patient. The patient was provided an opportunity to ask questions and all were answered. The patient agreed with the plan and demonstrated an understanding of the instructions.   The patient was advised to call back or seek an in-person evaluation if the symptoms worsen or if the condition fails to improve as anticipated.   Austin Pheasant, MD

## 2019-07-17 ENCOUNTER — Encounter: Payer: Self-pay | Admitting: Internal Medicine

## 2019-07-17 DIAGNOSIS — R1030 Lower abdominal pain, unspecified: Secondary | ICD-10-CM | POA: Insufficient documentation

## 2019-07-17 NOTE — Assessment & Plan Note (Signed)
Controlled on protonix.  Follow.   

## 2019-07-17 NOTE — Assessment & Plan Note (Signed)
Was previously scheduled for EGD and colonoscopy.  He previously canceled.  Given history of anemia and given intermittent pain and bowel issues as outlined, will refer to GI for his colonoscopy.  Has reflux.  Controlled.  D/w GI questionable need for EGD.  Follow cbc.

## 2019-07-17 NOTE — Assessment & Plan Note (Signed)
Left lower abdominal pain as outlined.  Check routine labs.  Discussed CT scan.  Will refer to GI for further evaluation.  Due colonoscopy.  Has a history of gastric ulcer.  Reflux controlled on protonix.  Will hold on CT.  Refer to GI - for further evaluation.  Pt in agreement.

## 2019-07-17 NOTE — Assessment & Plan Note (Signed)
Doing well on prozac.  Back on prozac.  Doing well.

## 2019-07-18 ENCOUNTER — Encounter: Payer: Self-pay | Admitting: Internal Medicine

## 2019-08-22 ENCOUNTER — Other Ambulatory Visit: Payer: Self-pay | Admitting: Gastroenterology

## 2019-10-14 ENCOUNTER — Other Ambulatory Visit: Payer: Self-pay

## 2019-10-14 DIAGNOSIS — K2100 Gastro-esophageal reflux disease with esophagitis, without bleeding: Secondary | ICD-10-CM

## 2019-10-14 MED ORDER — PANTOPRAZOLE SODIUM 40 MG PO TBEC: DELAYED_RELEASE_TABLET | ORAL | 0 refills | Status: DC

## 2019-10-21 ENCOUNTER — Telehealth (INDEPENDENT_AMBULATORY_CARE_PROVIDER_SITE_OTHER): Payer: No Typology Code available for payment source | Admitting: Internal Medicine

## 2019-10-21 ENCOUNTER — Telehealth: Payer: Self-pay | Admitting: Internal Medicine

## 2019-10-21 ENCOUNTER — Other Ambulatory Visit: Payer: Self-pay

## 2019-10-21 ENCOUNTER — Encounter: Payer: Self-pay | Admitting: Internal Medicine

## 2019-10-21 DIAGNOSIS — D649 Anemia, unspecified: Secondary | ICD-10-CM | POA: Diagnosis not present

## 2019-10-21 DIAGNOSIS — K219 Gastro-esophageal reflux disease without esophagitis: Secondary | ICD-10-CM | POA: Diagnosis not present

## 2019-10-21 DIAGNOSIS — F329 Major depressive disorder, single episode, unspecified: Secondary | ICD-10-CM | POA: Diagnosis not present

## 2019-10-21 DIAGNOSIS — F1099 Alcohol use, unspecified with unspecified alcohol-induced disorder: Secondary | ICD-10-CM

## 2019-10-21 DIAGNOSIS — F32A Depression, unspecified: Secondary | ICD-10-CM

## 2019-10-21 NOTE — Progress Notes (Signed)
Patient ID: Austin [name], male   DOB: Sep 12, 1980, 39 y.o.   MRN: [id number]   Virtual Visit via video Note  This visit type was conducted due to national recommendations for restrictions regarding the COVID-19 pandemic (e.g. social distancing).  This format is felt to be most appropriate for this patient at this time.  All issues noted in this document were discussed and addressed.  No physical exam was performed (except for noted visual exam findings with Video Visits).   I connected with [name] by a video enabled telemedicine application and verified that I am speaking with the correct person using two identifiers. Location patient: home Location provider: work  Persons participating in the virtual visit: patient, provider  The limitations, risks, security and privacy concerns of performing an evaluation and management service by video and the availability of in person appointment have been discussed.  The patient expressed understanding and agreed to proceed.   Reason for visit: scheduled follow up.   HPI: Recently saw GI for abdominal pain, etc.  Recommended remaining on protonix.  Also recommended bentyl prn.  Not having as frequent - bowel movements.  Planning for EGD and colonoscopy.  Still some occasional acid reflux.  No dysphagia or abdominal pain reported.  No chest pain or sob.  Increased stress. Working from home.  Not exercising.  Not going out.  Reports drinking again - sometimes 6 beers per day.  Discussed depression.  No suicidal thoughts.  Discussed counseling.  Not watching his diet. Discussed diet and exercise.     ROS: See pertinent positives and negatives per HPI.  Past Medical History:  Diagnosis Date  . Alcohol abuse    history of  . Allergy   . Anxiety   . Depression   . GERD (gastroesophageal reflux disease)   . Heart murmur   . Hypertension   . Ulcer    history of bleeding ulcer requiring blood transfusions    Past Surgical History:  Procedure  Laterality Date  . KNEE SURGERY     Left knee - ACL  . TONSILLECTOMY AND ADENOIDECTOMY  1992    Family History  Problem Relation Age of Onset  . Alcohol abuse Father   . Cancer Father        prostate cancer  . Arthritis Sister   . Cancer Maternal Grandmother        breast cancer  . Stroke Maternal Grandmother   . Diabetes Maternal Grandmother   . Hypertension Maternal Grandmother     SOCIAL HX: reviewed.    Current Outpatient Medications:  .  FLUoxetine (PROZAC) 20 MG tablet, Take 1 tablet (20 mg total) by mouth daily., Disp: 30 tablet, Rfl: 2 .  pantoprazole (PROTONIX) 40 MG tablet, TAKE 1 TABLET (40 MG TOTAL) BY MOUTH 2 (TWO) TIMES DAILY., Disp: 180 tablet, Rfl: 0  EXAM:  GENERAL: alert, oriented, appears well and in no acute distress  HEENT: atraumatic, conjunttiva clear, no obvious abnormalities on inspection of external nose and ears  NECK: normal movements of the head and neck  LUNGS: on inspection no signs of respiratory distress, breathing rate appears normal, no obvious gross SOB, gasping or wheezing  CV: no obvious cyanosis  PSYCH/NEURO: pleasant and cooperative, no obvious depression or anxiety, speech and thought processing grossly intact  ASSESSMENT AND PLAN:  Discussed the following assessment and plan:  GERD (gastroesophageal reflux disease) Occasional acid reflux.  On protonix.  Saw GI.  Planning for EGD in 11/2019.    Depression  On prozac.   Increased stress recently.  Not exercising.  Not watching what he is eating.  Drinking.  Discussed with him today.  On prozac.  Discussed counseling.  He declines counseling.  No suicidal ideations.  Discussed starting with small changes - getting out, etc.  Follow.  Recheck in 2 weeks.  Call if problems.    Anemia Follow cbc.   Alcohol use with alcohol-induced disorder ([facility]) Increased alcohol intake as outlined.  Discussed the need to quit.  Discussed AA.  Follow.     I discussed the assessment and  treatment plan with the patient. The patient was provided an opportunity to ask questions and all were answered. The patient agreed with the plan and demonstrated an understanding of the instructions.   The patient was advised to call back or seek an in-person evaluation if the symptoms worsen or if the condition fails to improve as anticipated.    Einar Pheasant, MD

## 2019-10-21 NOTE — Telephone Encounter (Signed)
LMTCB and schedule a 2wk follow up

## 2019-10-30 ENCOUNTER — Encounter: Payer: Self-pay | Admitting: Internal Medicine

## 2019-10-30 NOTE — Assessment & Plan Note (Signed)
On prozac.   Increased stress recently.  Not exercising.  Not watching what he is eating.  Drinking.  Discussed with him today.  On prozac.  Discussed counseling.  He declines counseling.  No suicidal ideations.  Discussed starting with small changes - getting out, etc.  Follow.  Recheck in 2 weeks.  Call if problems.

## 2019-10-30 NOTE — Assessment & Plan Note (Signed)
Increased alcohol intake as outlined.  Discussed the need to quit.  Discussed AA.  Follow.

## 2019-10-30 NOTE — Assessment & Plan Note (Signed)
Follow cbc.  

## 2019-10-30 NOTE — Assessment & Plan Note (Signed)
Occasional acid reflux.  On protonix.  Saw GI.  Planning for EGD in 11/2019.

## 2019-12-08 ENCOUNTER — Other Ambulatory Visit: Payer: Self-pay

## 2019-12-08 ENCOUNTER — Other Ambulatory Visit
Admission: RE | Admit: 2019-12-08 | Discharge: 2019-12-08 | Disposition: A | Discharge status: Home / Self Care | Payer: No Typology Code available for payment source | Source: Ambulatory Visit | Attending: Internal Medicine | Admitting: Internal Medicine

## 2019-12-08 DIAGNOSIS — Z01812 Encounter for preprocedural laboratory examination: Secondary | ICD-10-CM | POA: Insufficient documentation

## 2019-12-08 DIAGNOSIS — Z20822 Contact with and (suspected) exposure to covid-19: Secondary | ICD-10-CM | POA: Insufficient documentation

## 2019-12-08 LAB — SARS CORONAVIRUS 2 (TAT 6-24 HRS): SARS Coronavirus 2: NEGATIVE

## 2019-12-09 ENCOUNTER — Encounter: Payer: Self-pay | Admitting: Internal Medicine

## 2019-12-12 ENCOUNTER — Encounter: Payer: Self-pay | Admitting: Internal Medicine

## 2019-12-12 ENCOUNTER — Encounter
Admission: RE | Disposition: A | Discharge status: Home / Self Care | Payer: Self-pay | Source: Home / Self Care | Attending: Internal Medicine

## 2019-12-12 ENCOUNTER — Ambulatory Visit
Admission: RE | Admit: 2019-12-12 | Discharge: 2019-12-12 | Disposition: A | Discharge status: Home / Self Care | Payer: No Typology Code available for payment source | Attending: Internal Medicine | Admitting: Internal Medicine

## 2019-12-12 ENCOUNTER — Ambulatory Visit: Payer: No Typology Code available for payment source | Admitting: Registered Nurse

## 2019-12-12 ENCOUNTER — Other Ambulatory Visit: Payer: Self-pay

## 2019-12-12 DIAGNOSIS — R1013 Epigastric pain: Secondary | ICD-10-CM | POA: Insufficient documentation

## 2019-12-12 DIAGNOSIS — Z79899 Other long term (current) drug therapy: Secondary | ICD-10-CM | POA: Diagnosis not present

## 2019-12-12 DIAGNOSIS — Z87891 Personal history of nicotine dependence: Secondary | ICD-10-CM | POA: Insufficient documentation

## 2019-12-12 DIAGNOSIS — F329 Major depressive disorder, single episode, unspecified: Secondary | ICD-10-CM | POA: Diagnosis not present

## 2019-12-12 DIAGNOSIS — I10 Essential (primary) hypertension: Secondary | ICD-10-CM | POA: Diagnosis not present

## 2019-12-12 DIAGNOSIS — R1012 Left upper quadrant pain: Secondary | ICD-10-CM | POA: Insufficient documentation

## 2019-12-12 DIAGNOSIS — K449 Diaphragmatic hernia without obstruction or gangrene: Secondary | ICD-10-CM | POA: Insufficient documentation

## 2019-12-12 DIAGNOSIS — F419 Anxiety disorder, unspecified: Secondary | ICD-10-CM | POA: Insufficient documentation

## 2019-12-12 DIAGNOSIS — K2100 Gastro-esophageal reflux disease with esophagitis, without bleeding: Secondary | ICD-10-CM | POA: Insufficient documentation

## 2019-12-12 DIAGNOSIS — K2950 Unspecified chronic gastritis without bleeding: Secondary | ICD-10-CM | POA: Insufficient documentation

## 2019-12-12 DIAGNOSIS — R197 Diarrhea, unspecified: Secondary | ICD-10-CM | POA: Diagnosis not present

## 2019-12-12 HISTORY — PX: ESOPHAGOGASTRODUODENOSCOPY (EGD) WITH PROPOFOL: SHX5813

## 2019-12-12 HISTORY — PX: COLONOSCOPY WITH PROPOFOL: SHX5780

## 2019-12-12 LAB — HM COLONOSCOPY

## 2019-12-12 SURGERY — COLONOSCOPY WITH PROPOFOL
Anesthesia: General

## 2019-12-12 MED ORDER — SODIUM CHLORIDE 0.9 % IV SOLN: INTRAVENOUS | Status: DC

## 2019-12-12 MED ORDER — FENTANYL CITRATE (PF) 100 MCG/2ML IJ SOLN
INTRAMUSCULAR | Status: AC
  Filled 2019-12-12: qty 2

## 2019-12-12 MED ORDER — FENTANYL CITRATE (PF) 100 MCG/2ML IJ SOLN
INTRAMUSCULAR | Status: DC | PRN
  Administered 2019-12-12 (×2): 50 ug via INTRAVENOUS

## 2019-12-12 MED ORDER — PROPOFOL 500 MG/50ML IV EMUL
INTRAVENOUS | Status: AC
  Filled 2019-12-12: qty 50

## 2019-12-12 MED ORDER — PROPOFOL 10 MG/ML IV BOLUS
INTRAVENOUS | Status: DC | PRN
  Administered 2019-12-12 (×2): 50 mg via INTRAVENOUS
  Administered 2019-12-12: 70 mg via INTRAVENOUS
  Administered 2019-12-12: 20 mg via INTRAVENOUS
  Administered 2019-12-12: 30 mg via INTRAVENOUS

## 2019-12-12 MED ORDER — LIDOCAINE HCL (PF) 2 % IJ SOLN
INTRAMUSCULAR | Status: DC | PRN
Start: 2019-12-12 — End: 2019-12-12
  Administered 2019-12-12: 60 mg via INTRADERMAL

## 2019-12-12 MED ORDER — PROPOFOL 500 MG/50ML IV EMUL
INTRAVENOUS | Status: DC | PRN
  Administered 2019-12-12: 200 ug/kg/min via INTRAVENOUS

## 2019-12-12 NOTE — Op Note (Signed)
[facility] Gastroenterology Patient Name: Austin [name] Procedure Date: 12/12/2019 12:35 PM MRN: [id number] Account #: [id number] Date of Birth: 11-22-1980 Admit Type: Outpatient Age: 39 Room: [facility] ENDO ROOM 3 Gender: Male Note Status: Finalized Procedure:             Upper GI endoscopy Indications:           Epigastric abdominal pain, Abdominal pain in the left                         upper quadrant, Gastro-esophageal reflux disease Providers:             Benay Pike. Alice Reichert MD, MD Referring MD:          Einar Pheasant, MD (Referring MD) Medicines:             Propofol per Anesthesia Complications:         No immediate complications. Procedure:             Pre-Anesthesia Assessment:                        - The risks and benefits of the procedure and the                         sedation options and risks were discussed with the                         patient. All questions were answered and informed                         consent was obtained.                        - Patient identification and proposed procedure were                         verified prior to the procedure by the nurse. The                         procedure was verified in the procedure room.                        - ASA Grade Assessment: II - A patient with mild                         systemic disease.                        - After reviewing the risks and benefits, the patient                         was deemed in satisfactory condition to undergo the                         procedure.                        After obtaining informed consent, the endoscope was                         passed under  direct vision. Throughout the procedure,                         the patient's blood pressure, pulse, and oxygen                         saturations were monitored continuously. The Endoscope                         was introduced through the mouth, and advanced to the                         third part  of duodenum. The upper GI endoscopy was                         accomplished without difficulty. The patient tolerated                         the procedure well. Findings:      The examined esophagus was normal.      A 1 cm hiatal hernia was present.      Localized minimal inflammation characterized by erythema was found in       the gastric antrum. Biopsies were taken with a cold forceps for       Helicobacter pylori testing.      The examined duodenum was normal.      The exam was otherwise without abnormality. Impression:            - Normal esophagus.                        - 1 cm hiatal hernia.                        - Gastritis. Biopsied.                        - Normal examined duodenum.                        - The examination was otherwise normal. Recommendation:        - Await pathology results.                        - Proceed with colonoscopy Procedure Code(s):     --- Professional ---                        [phone], Esophagogastroduodenoscopy, flexible,                         transoral; with biopsy, single or multiple Diagnosis Code(s):     --- Professional ---                        K21.9, Gastro-esophageal reflux disease without                         esophagitis                        R10.12, Left upper quadrant pain  R10.13, Epigastric pain                        K29.70, Gastritis, unspecified, without bleeding                        K44.9, Diaphragmatic hernia without obstruction or                         gangrene CPT copyright 2019 [employer]. All rights reserved. The codes documented in this report are preliminary and upon coder review may  be revised to meet current compliance requirements. Stanton Kidney MD, MD 12/12/2019 1:46:23 PM This report has been signed electronically. Number of Addenda: 0 Note Initiated On: 12/12/2019 12:35 PM Estimated Blood Loss:  Estimated blood loss: none.      [facility]

## 2019-12-12 NOTE — H&P (Signed)
Outpatient short stay form Pre-procedure 12/12/2019 1:08 PM Duke Weisensel K. Norma Fredrickson, M.D.  Primary Physician: Dale [place], M.D.  Reason for visit: Epigastric pain, history of peptic ulcer disease, loose stools (diarrhea).  GERD.  History of present illness:  As above.Patient with LUQ and epigastric pain without increased use of NSAIDs. Has GERD controlled some with Pantoprazole 40mg  po bid. No dysphagia, melena or hematochezia. Patient with intermittent diarrhea/loose stools with hematochezia or melena.    Current Facility-Administered Medications:  .  0.9 %  sodium chloride infusion, , Intravenous, Continuous, Isabel, [place], MD, Last Rate: 20 mL/hr at 12/12/19 1307, New Bag at 12/12/19 1307  Medications Prior to Admission  Medication Sig Dispense Refill Last Dose  . acetaminophen (TYLENOL) 500 MG tablet Take 500 mg by mouth 2 (two) times daily as needed.   12/11/2019 at Unknown time  . cetirizine (ZYRTEC) 10 MG tablet Take 10 mg by mouth daily.   12/11/2019 at Unknown time  . dicyclomine (BENTYL) 10 MG capsule Take 10 mg by mouth 4 (four) times daily -  before meals and at bedtime.   12/11/2019 at Unknown time  . FLUoxetine (PROZAC) 20 MG tablet Take 1 tablet (20 mg total) by mouth daily. 30 tablet 2 12/11/2019 at Unknown time  . pantoprazole (PROTONIX) 40 MG tablet TAKE 1 TABLET (40 MG TOTAL) BY MOUTH 2 (TWO) TIMES DAILY. 180 tablet 0 12/11/2019 at Unknown time     Allergies  Allergen Reactions  . Sulfa Antibiotics      Past Medical History:  Diagnosis Date  . Alcohol abuse    history of  . Allergy   . Anxiety   . Depression   . GERD (gastroesophageal reflux disease)   . Heart murmur   . Hypertension   . Ulcer    history of bleeding ulcer requiring blood transfusions    Review of systems:  Otherwise negative.    Physical Exam  Gen: Alert, oriented. Appears stated age.  HEENT: [place]/AT. PERRLA. Lungs: CTA, no wheezes. CV: RR nl S1, S2. Abd: soft, benign, no masses.  BS+ Ext: No edema. Pulses 2+    Planned procedures: Proceed with EGD and colonoscopy. The patient understands the nature of the planned procedure, indications, risks, alternatives and potential complications including but not limited to bleeding, infection, perforation, damage to internal organs and possible oversedation/side effects from anesthesia. The patient agrees and gives consent to proceed.  Please refer to procedure notes for findings, recommendations and patient disposition/instructions.     Teigan Sahli K. 12/13/2019, M.D. Gastroenterology 12/12/2019  1:08 PM

## 2019-12-12 NOTE — Anesthesia Postprocedure Evaluation (Signed)
Anesthesia Post Note  Patient: Austin [name]  Procedure(s) Performed: COLONOSCOPY WITH PROPOFOL (N/A ) ESOPHAGOGASTRODUODENOSCOPY (EGD) WITH PROPOFOL (N/A )  Patient location during evaluation: Endoscopy Anesthesia Type: General Level of consciousness: awake and alert Pain management: pain level controlled Vital Signs Assessment: post-procedure vital signs reviewed and stable Respiratory status: spontaneous breathing, nonlabored ventilation, respiratory function stable and patient connected to nasal cannula oxygen Cardiovascular status: blood pressure returned to baseline and stable Postop Assessment: no apparent nausea or vomiting Anesthetic complications: no   No complications documented.   Last Vitals:  Vitals:   12/12/19 1426 12/12/19 1436  BP: 127/90 (!) 142/89  Pulse: 80 62  Resp: 19 (!) 22  Temp:    SpO2: 97% 100%    Last Pain:  Vitals:   12/12/19 1436  TempSrc:   PainSc: 0-No pain                 Corinda Gubler

## 2019-12-12 NOTE — Anesthesia Preprocedure Evaluation (Addendum)
Anesthesia Evaluation  Patient identified by MRN, date of birth, ID band Patient awake    Reviewed: Allergy & Precautions, H&P , NPO status , Patient's Chart, lab work & pertinent test results  Airway Mallampati: III  TM Distance: >3 FB     Dental  (+) Teeth Intact   Pulmonary neg COPD, former smoker,    breath sounds clear to auscultation       Cardiovascular hypertension, (-) angina(-) Cardiac Stents (-) dysrhythmias  Rhythm:regular Rate:Normal     Neuro/Psych PSYCHIATRIC DISORDERS Anxiety Depression negative neurological ROS     GI/Hepatic PUD, GERD  ,(+)     substance abuse (3 drinks per day. Daily marijuana use)  alcohol use and marijuana use,   Endo/Other  negative endocrine ROS  Renal/GU negative Renal ROS  negative genitourinary   Musculoskeletal   Abdominal   Peds  Hematology negative hematology ROS (+)   Anesthesia Other Findings Past Medical History: No date: Alcohol abuse     Comment:  history of No date: Allergy No date: Anxiety No date: Depression No date: GERD (gastroesophageal reflux disease) No date: Heart murmur No date: Hypertension No date: Ulcer     Comment:  history of bleeding ulcer requiring blood transfusions  Past Surgical History: No date: KNEE SURGERY     Comment:  Left knee - ACL 1992: TONSILLECTOMY AND ADENOIDECTOMY     Reproductive/Obstetrics negative OB ROS                            Anesthesia Physical Anesthesia Plan  ASA: II  Anesthesia Plan: General   Post-op Pain Management:    Induction:   PONV Risk Score and Plan: Propofol infusion and TIVA  Airway Management Planned: Nasal Cannula and Natural Airway  Additional Equipment:   Intra-op Plan:   Post-operative Plan:   Informed Consent: I have reviewed the patients History and Physical, chart, labs and discussed the procedure including the risks, benefits and alternatives for  the proposed anesthesia with the patient or authorized representative who has indicated his/her understanding and acceptance.     Dental Advisory Given  Plan Discussed with: Anesthesiologist, CRNA and Surgeon  Anesthesia Plan Comments:         Anesthesia Quick Evaluation

## 2019-12-12 NOTE — Transfer of Care (Signed)
Immediate Anesthesia Transfer of Care Note  Patient: AYDON SWAMY  Procedure(s) Performed: COLONOSCOPY WITH PROPOFOL (N/A ) ESOPHAGOGASTRODUODENOSCOPY (EGD) WITH PROPOFOL (N/A )  Patient Location: PACU and Endoscopy Unit  Anesthesia Type:General  Level of Consciousness: sedated  Airway & Oxygen Therapy: Patient Spontanous Breathing and Patient connected to nasal cannula oxygen  Post-op Assessment: Report given to RN and Post -op Vital signs reviewed and stable  Post vital signs: Reviewed and stable  Last Vitals:  Vitals Value Taken Time  BP 110/66 12/12/19 1406  Temp 36.6 C 12/12/19 1406  Pulse 80 12/12/19 1408  Resp 15 12/12/19 1408  SpO2 94 % 12/12/19 1408    Last Pain:  Vitals:   12/12/19 1406  TempSrc: Temporal  PainSc: Asleep         Complications: No complications documented.

## 2019-12-12 NOTE — Op Note (Signed)
[facility] Gastroenterology Patient Name: Austin [name] Procedure Date: 12/12/2019 12:34 PM MRN: [id number] Account #: [id number] Date of Birth: [birth year] Admit Type: Outpatient Age: 39 Room: [facility] ENDO ROOM 3 Gender: Male Note Status: Finalized Procedure:             Colonoscopy Indications:           Diarrhea, Change in bowel habits Providers:             Benay Pike. Alice Reichert MD, MD Referring MD:          Einar Pheasant, MD (Referring MD) Medicines:             Propofol per Anesthesia Complications:         No immediate complications. Procedure:             Pre-Anesthesia Assessment:                        - The risks and benefits of the procedure and the                         sedation options and risks were discussed with the                         patient. All questions were answered and informed                         consent was obtained.                        - Patient identification and proposed procedure were                         verified prior to the procedure by the nurse. The                         procedure was verified in the procedure room.                        - ASA Grade Assessment: II - A patient with mild                         systemic disease.                        - After reviewing the risks and benefits, the patient                         was deemed in satisfactory condition to undergo the                         procedure.                        After obtaining informed consent, the colonoscope was                         passed under direct vision. Throughout the procedure,                         the patient's blood pressure, pulse,  and oxygen                         saturations were monitored continuously. The                         Colonoscope was introduced through the anus and                         advanced to the the terminal ileum, with                         identification of the appendiceal orifice and IC                          valve. The colonoscopy was performed without                         difficulty. The patient tolerated the procedure well.                         The quality of the bowel preparation was good. The                         terminal ileum, ileocecal valve, appendiceal orifice,                         and rectum were photographed. Findings:      The perianal and digital rectal examinations were normal. Pertinent       negatives include normal sphincter tone and no palpable rectal lesions.      The terminal ileum appeared normal. Biopsies were taken with a cold       forceps for histology.      The colon (entire examined portion) appeared normal. Biopsies for       histology were taken with a cold forceps from the right colon, left       colon and rectum for evaluation of microscopic colitis.      The exam was otherwise without abnormality on direct and retroflexion       views. Impression:            - The examined portion of the ileum was normal.                         Biopsied.                        - The entire examined colon is normal. Biopsied.                        - The examination was otherwise normal on direct and                         retroflexion views. Recommendation:        - Patient has a contact number available for                         emergencies. The signs and symptoms of potential  delayed complications were discussed with the patient.                         Return to normal activities tomorrow. Written                         discharge instructions were provided to the patient.                        - Resume previous diet.                        - Continue present medications.                        - Await pathology results.                        - Return to physician assistant in 7 weeks.                        - Follow up with Austin Moores, PA-C in [ ]  months. Procedure Code(s):     --- Professional ---                         [id number], Colonoscopy, flexible; with biopsy, single or                         multiple Diagnosis Code(s):     --- Professional ---                        R19.4, Change in bowel habit                        R19.7, Diarrhea, unspecified CPT copyright 2019 American Medical Association. All rights reserved. The codes documented in this report are preliminary and upon coder review may  be revised to meet current compliance requirements. 2020 MD, MD 12/12/2019 2:03:52 PM This report has been signed electronically. Number of Addenda: 0 Note Initiated On: 12/12/2019 12:34 PM Scope Withdrawal Time: 0 hours 8 minutes 0 seconds  Total Procedure Duration: 0 hours 12 minutes 16 seconds  Estimated Blood Loss:  Estimated blood loss: none.      [facility]

## 2019-12-12 NOTE — Interval H&P Note (Signed)
History and Physical Interval Note:  12/12/2019 1:16 PM  Austin [name]  has presented today for surgery, with the diagnosis of GERD EPIG PAIN LOOSE STOOLS LUQ PAIN.  The various methods of treatment have been discussed with the patient and family. After consideration of risks, benefits and other options for treatment, the patient has consented to  Procedure(s): COLONOSCOPY WITH PROPOFOL (N/A) ESOPHAGOGASTRODUODENOSCOPY (EGD) WITH PROPOFOL (N/A) as a surgical intervention.  The patient's history has been reviewed, patient examined, no change in status, stable for surgery.  I have reviewed the patient's chart and labs.  Questions were answered to the patient's satisfaction.     Higginson, [place]

## 2019-12-13 ENCOUNTER — Encounter: Payer: Self-pay | Admitting: Internal Medicine

## 2019-12-15 LAB — SURGICAL PATHOLOGY

## 2019-12-20 ENCOUNTER — Other Ambulatory Visit: Payer: Self-pay | Admitting: Internal Medicine

## 2019-12-20 ENCOUNTER — Encounter: Payer: Self-pay | Admitting: Internal Medicine

## 2019-12-20 MED ORDER — FLUOXETINE HCL 20 MG PO TABS: 20.0000 mg | ORAL_TABLET | Freq: Every day | ORAL | 1 refills | Status: DC

## 2019-12-30 ENCOUNTER — Encounter: Payer: Self-pay | Admitting: Internal Medicine

## 2020-01-11 ENCOUNTER — Encounter: Payer: Self-pay | Admitting: Internal Medicine

## 2020-01-11 DIAGNOSIS — Z0000 Encounter for general adult medical examination without abnormal findings: Secondary | ICD-10-CM | POA: Insufficient documentation

## 2020-02-15 ENCOUNTER — Other Ambulatory Visit: Payer: Self-pay | Admitting: Internal Medicine

## 2020-02-15 DIAGNOSIS — K2100 Gastro-esophageal reflux disease with esophagitis, without bleeding: Secondary | ICD-10-CM

## 2020-02-15 MED ORDER — PANTOPRAZOLE SODIUM 40 MG PO TBEC: DELAYED_RELEASE_TABLET | ORAL | 0 refills | Status: DC

## 2020-04-27 ENCOUNTER — Ambulatory Visit (INDEPENDENT_AMBULATORY_CARE_PROVIDER_SITE_OTHER): Payer: No Typology Code available for payment source

## 2020-04-27 ENCOUNTER — Other Ambulatory Visit: Payer: Self-pay

## 2020-04-27 DIAGNOSIS — Z23 Encounter for immunization: Secondary | ICD-10-CM

## 2020-06-12 ENCOUNTER — Other Ambulatory Visit: Payer: Self-pay | Admitting: Internal Medicine

## 2020-06-12 ENCOUNTER — Encounter: Payer: Self-pay | Admitting: Internal Medicine

## 2020-06-12 ENCOUNTER — Other Ambulatory Visit: Payer: Self-pay

## 2020-06-12 DIAGNOSIS — K2100 Gastro-esophageal reflux disease with esophagitis, without bleeding: Secondary | ICD-10-CM

## 2020-06-12 MED ORDER — PANTOPRAZOLE SODIUM 40 MG PO TBEC: DELAYED_RELEASE_TABLET | ORAL | 0 refills | Status: DC

## 2020-07-27 ENCOUNTER — Other Ambulatory Visit: Payer: Self-pay | Admitting: Internal Medicine

## 2020-07-27 ENCOUNTER — Encounter: Payer: Self-pay | Admitting: Internal Medicine

## 2020-09-10 ENCOUNTER — Ambulatory Visit (INDEPENDENT_AMBULATORY_CARE_PROVIDER_SITE_OTHER): Payer: No Typology Code available for payment source | Admitting: Internal Medicine

## 2020-09-10 ENCOUNTER — Encounter: Payer: Self-pay | Admitting: Internal Medicine

## 2020-09-10 ENCOUNTER — Other Ambulatory Visit: Payer: Self-pay

## 2020-09-10 VITALS — BP 122/70 | HR 83 | Temp 98.3°F | Resp 16 | Ht 63.0 in | Wt 181.4 lb

## 2020-09-10 DIAGNOSIS — R739 Hyperglycemia, unspecified: Secondary | ICD-10-CM | POA: Diagnosis not present

## 2020-09-10 DIAGNOSIS — K219 Gastro-esophageal reflux disease without esophagitis: Secondary | ICD-10-CM | POA: Diagnosis not present

## 2020-09-10 DIAGNOSIS — F320 Major depressive disorder, single episode, mild: Secondary | ICD-10-CM

## 2020-09-10 DIAGNOSIS — Z13220 Encounter for screening for lipoid disorders: Secondary | ICD-10-CM

## 2020-09-10 DIAGNOSIS — D649 Anemia, unspecified: Secondary | ICD-10-CM

## 2020-09-10 DIAGNOSIS — Z0000 Encounter for general adult medical examination without abnormal findings: Secondary | ICD-10-CM

## 2020-09-10 DIAGNOSIS — F1099 Alcohol use, unspecified with unspecified alcohol-induced disorder: Secondary | ICD-10-CM

## 2020-09-10 DIAGNOSIS — F32A Depression, unspecified: Secondary | ICD-10-CM

## 2020-09-10 LAB — HEPATIC FUNCTION PANEL
ALT: 25 U/L (ref 0–53)
AST: 22 U/L (ref 0–37)
Albumin: 4.1 g/dL (ref 3.5–5.2)
Alkaline Phosphatase: 75 U/L (ref 39–117)
Bilirubin, Direct: 0.1 mg/dL (ref 0.0–0.3)
Total Bilirubin: 0.7 mg/dL (ref 0.2–1.2)
Total Protein: 7.2 g/dL (ref 6.0–8.3)

## 2020-09-10 LAB — CBC WITH DIFFERENTIAL/PLATELET
Basophils Absolute: 0 10*3/uL (ref 0.0–0.1)
Basophils Relative: 0.4 % (ref 0.0–3.0)
Eosinophils Absolute: 0.1 10*3/uL (ref 0.0–0.7)
Eosinophils Relative: 2.7 % (ref 0.0–5.0)
HCT: 42.6 % (ref 39.0–52.0)
Hemoglobin: 14.5 g/dL (ref 13.0–17.0)
Lymphocytes Relative: 33 % (ref 12.0–46.0)
Lymphs Abs: 1.8 10*3/uL (ref 0.7–4.0)
MCHC: 34 g/dL (ref 30.0–36.0)
MCV: 90.1 fl (ref 78.0–100.0)
Monocytes Absolute: 0.5 10*3/uL (ref 0.1–1.0)
Monocytes Relative: 8.7 % (ref 3.0–12.0)
Neutro Abs: 3.1 10*3/uL (ref 1.4–7.7)
Neutrophils Relative %: 55.2 % (ref 43.0–77.0)
Platelets: 284 10*3/uL (ref 150.0–400.0)
RBC: 4.73 Mil/uL (ref 4.22–5.81)
RDW: 13 % (ref 11.5–15.5)
WBC: 5.5 10*3/uL (ref 4.0–10.5)

## 2020-09-10 LAB — LIPID PANEL
Cholesterol: 177 mg/dL (ref 0–200)
HDL: 65 mg/dL (ref >39.00)
LDL Cholesterol: 89 mg/dL (ref 0–99)
NonHDL: 112.39
Total CHOL/HDL Ratio: 3
Triglycerides: 118 mg/dL (ref 0.0–149.0)
VLDL: 23.6 mg/dL (ref 0.0–40.0)

## 2020-09-10 LAB — HEMOGLOBIN A1C: Hgb A1c MFr Bld: 6 % (ref 4.6–6.5)

## 2020-09-10 LAB — BASIC METABOLIC PANEL
BUN: 7 mg/dL (ref 6–23)
CO2: 31 mEq/L (ref 19–32)
Calcium: 9.2 mg/dL (ref 8.4–10.5)
Chloride: 100 mEq/L (ref 96–112)
Creatinine, Ser: 0.78 mg/dL (ref 0.40–1.50)
GFR: 112.76 mL/min (ref >60.00)
Glucose, Bld: 97 mg/dL (ref 70–99)
Potassium: 3.6 mEq/L (ref 3.5–5.1)
Sodium: 138 mEq/L (ref 135–145)

## 2020-09-10 LAB — TSH: TSH: 0.63 u[IU]/mL (ref 0.35–4.50)

## 2020-09-10 NOTE — Assessment & Plan Note (Addendum)
Physical today 09/10/20.  Colonoscopy 12/12/19 - normal (Dr Norma Fredrickson).

## 2020-09-10 NOTE — Progress Notes (Signed)
Patient ID: Austin [name], male   DOB: [birth year], 40 y.o.   MRN: [id number]   Subjective:    Patient ID: Austin [name], male    DOB: [birth year], 40 y.o.   MRN: [id number]  HPI This visit occurred during the SARS-CoV-2 public health emergency.  Safety protocols were in place, including screening questions prior to the visit, additional usage of staff PPE, and extensive cleaning of exam room while observing appropriate contact time as indicated for disinfecting solutions.  Patient here for his physical exam.  Reports increased stress with work and family.  Working from home.  Trying to take care of his kids and work.  Stress with his father. Discussed.  Drinking more.  No suicidal ideations.  Discussed therapy.  Discussed psych referral.  He is agreeable.  Not exercising regularly.  No chest pain or sob reported.  No abdominal pain or bowel change reported.    Past Medical History:  Diagnosis Date  . Alcohol abuse    history of  . Allergy   . Anxiety   . Depression   . GERD (gastroesophageal reflux disease)   . Heart murmur   . Hypertension   . Ulcer    history of bleeding ulcer requiring blood transfusions   Past Surgical History:  Procedure Laterality Date  . COLONOSCOPY WITH PROPOFOL N/A 12/12/2019   Procedure: COLONOSCOPY WITH PROPOFOL;  Surgeon: Toledo, Benay Pike, MD;  Location: [facility] ENDOSCOPY;  Service: Gastroenterology;  Laterality: N/A;  . ESOPHAGOGASTRODUODENOSCOPY (EGD) WITH PROPOFOL N/A 12/12/2019   Procedure: ESOPHAGOGASTRODUODENOSCOPY (EGD) WITH PROPOFOL;  Surgeon: Toledo, Benay Pike, MD;  Location: [facility] ENDOSCOPY;  Service: Gastroenterology;  Laterality: N/A;  . KNEE SURGERY     Left knee - ACL  . TONSILLECTOMY AND ADENOIDECTOMY  1992   Family History  Problem Relation Age of Onset  . Alcohol abuse Father   . Cancer Father        prostate cancer  . Arthritis Sister   . Cancer Maternal Grandmother        breast cancer  . Stroke Maternal Grandmother   . Diabetes Maternal  Grandmother   . Hypertension Maternal Grandmother    Social History   Socioeconomic History  . Marital status: Married    Spouse name: Not on file  . Number of children: Not on file  . Years of education: Not on file  . Highest education level: Not on file  Occupational History  . Not on file  Tobacco Use  . Smoking status: Former Smoker    Packs/day: 0.50    Types: Cigarettes  . Smokeless tobacco: Never Used  . Tobacco comment: Quit smoking recently  Vaping Use  . Vaping Use: Never used  Substance and Sexual Activity  . Alcohol use: Yes    Alcohol/week: 2.0 standard drinks    Types: 2 Cans of beer per week  . Drug use: Yes    Types: Marijuana    Comment: marijuana  . Sexual activity: Not on file  Other Topics Concern  . Not on file  Social History Narrative  . Not on file   Social Determinants of Health   Financial Resource Strain: Not on file  Food Insecurity: Not on file  Transportation Needs: Not on file  Physical Activity: Not on file  Stress: Not on file  Social Connections: Not on file    Outpatient Encounter Medications as of 09/10/2020  Medication Sig  . acetaminophen (TYLENOL) 500 MG tablet Take 500 mg by mouth 2 (two)  times daily as needed.  . cetirizine (ZYRTEC) 10 MG tablet Take 10 mg by mouth daily.  Marland Kitchen dicyclomine (BENTYL) 10 MG capsule Take 10 mg by mouth 4 (four) times daily -  before meals and at bedtime.  Marland Kitchen FLUoxetine (PROZAC) 20 MG capsule TAKE 1 CAPSULE BY MOUTH DAILY.  . pantoprazole (PROTONIX) 40 MG tablet TAKE 1 TABLET (40 MG TOTAL) BY MOUTH 2 (TWO) TIMES DAILY.   No facility-administered encounter medications on file as of 09/10/2020.    Review of Systems  Constitutional: Negative for appetite change and unexpected weight change.  HENT: Negative for congestion, sinus pressure and sore throat.   Eyes: Negative for pain and visual disturbance.  Respiratory: Negative for cough, chest tightness and shortness of breath.   Cardiovascular:  Negative for chest pain, palpitations and leg swelling.  Gastrointestinal: Negative for abdominal pain, diarrhea, nausea and vomiting.  Genitourinary: Negative for difficulty urinating and dysuria.  Musculoskeletal: Negative for joint swelling and myalgias.  Skin: Negative for color change and rash.  Neurological: Negative for dizziness, light-headedness and headaches.  Hematological: Negative for adenopathy. Does not bruise/bleed easily.  Psychiatric/Behavioral:       Increased stress as outlined.  No suicidal ideations.         Objective:    Physical Exam Vitals reviewed.  Constitutional:      General: He is not in acute distress.    Appearance: Normal appearance. He is well-developed.  HENT:     Right Ear: External ear normal.     Left Ear: External ear normal.  Eyes:     General: No scleral icterus.       Right eye: No discharge.        Left eye: No discharge.     Conjunctiva/sclera: Conjunctivae normal.  Neck:     Thyroid: No thyromegaly.  Cardiovascular:     Rate and Rhythm: Normal rate and regular rhythm.  Pulmonary:     Effort: No respiratory distress.     Breath sounds: Normal breath sounds. No wheezing.  Abdominal:     General: Bowel sounds are normal.     Palpations: Abdomen is soft.     Tenderness: There is no abdominal tenderness.  Musculoskeletal:        General: No swelling or tenderness.     Cervical back: Neck supple. No tenderness.  Lymphadenopathy:     Cervical: No cervical adenopathy.  Skin:    Findings: No erythema or rash.  Neurological:     Mental Status: He is alert and oriented to person, place, and time.  Psychiatric:        Mood and Affect: Mood normal.        Behavior: Behavior normal.     BP 122/70   Pulse 83   Temp 98.3 F (36.8 C) (Oral)   Resp 16   Ht 5' 3"  (1.6 m)   Wt 181 lb 6.4 oz (82.3 kg)   SpO2 99%   BMI 32.13 kg/m  Wt Readings from Last 3 Encounters:  09/10/20 181 lb 6.4 oz (82.3 kg)  12/12/19 176 lb (79.8 kg)   07/13/19 181 lb (82.1 kg)     Lab Results  Component Value Date   WBC 5.5 09/10/2020   HGB 14.5 09/10/2020   HCT 42.6 09/10/2020   PLT 284.0 09/10/2020   GLUCOSE 97 09/10/2020   CHOL 177 09/10/2020   TRIG 118.0 09/10/2020   HDL 65.00 09/10/2020   LDLCALC 89 09/10/2020   ALT 25 09/10/2020  AST 22 09/10/2020   NA 138 09/10/2020   K 3.6 09/10/2020   CL 100 09/10/2020   CREATININE 0.78 09/10/2020   BUN 7 09/10/2020   CO2 31 09/10/2020   TSH 0.63 09/10/2020   HGBA1C 6.0 09/10/2020       Assessment & Plan:   Problem List Items Addressed This Visit    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  No binge drinking reported.  Using alcohol to relax.  Discussed treating the stress and depression.  He is agreeable.        Anemia    Had GI w/up 11/2019 - EGD and colonoscopy.  Recheck cbc today.       Relevant Orders   CBC with Differential/Platelet (Completed)   GERD (gastroesophageal reflux disease)    Continue protonix.  EGD 11/2019 - gastritis and hiatal hernia.  No increased acid reflux.        Health care maintenance    Physical today 09/10/20.  Colonoscopy 12/12/19 - normal (Dr Alice Reichert).       Hyperglycemia    Low carb diet and exercise.  Follow met b and a1c.       Relevant Orders   Hepatic function panel (Completed)   TSH (Completed)   Basic metabolic panel (Completed)   Hemoglobin A1c (Completed)   Mild depression (H[facility])    Increased stress and some depression as outlined.  On prozac.  No suicidal ideations.  Discussed therapy.  Discussed referral to psychiatry.  He is agreeable.        Relevant Orders   Ambulatory referral to Psychiatry    Other Visit Diagnoses    Routine general medical examination at a health care facility    -  Primary   Screening cholesterol level       Relevant Orders   Lipid panel (Completed)       Einar Pheasant, MD

## 2020-09-13 ENCOUNTER — Encounter: Payer: Self-pay | Admitting: Internal Medicine

## 2020-09-17 ENCOUNTER — Encounter: Payer: Self-pay | Admitting: Internal Medicine

## 2020-09-17 NOTE — Assessment & Plan Note (Signed)
Increased alcohol intake related to increased stress.  No binge drinking reported.  Using alcohol to relax.  Discussed treating the stress and depression.  He is agreeable.

## 2020-09-17 NOTE — Assessment & Plan Note (Addendum)
Continue protonix.  EGD 11/2019 - gastritis and hiatal hernia.  No increased acid reflux.

## 2020-09-17 NOTE — Assessment & Plan Note (Signed)
Low carb diet and exercise.  Follow met b and a1c.  

## 2020-09-17 NOTE — Assessment & Plan Note (Signed)
Had GI w/up 11/2019 - EGD and colonoscopy.  Recheck cbc today.

## 2020-09-17 NOTE — Assessment & Plan Note (Signed)
Increased stress and some depression as outlined.  On prozac.  No suicidal ideations.  Discussed therapy.  Discussed referral to psychiatry.  He is agreeable.

## 2020-09-27 ENCOUNTER — Encounter: Payer: Self-pay | Admitting: Internal Medicine

## 2020-09-27 ENCOUNTER — Telehealth: Payer: Self-pay | Admitting: Internal Medicine

## 2020-09-27 NOTE — Telephone Encounter (Signed)
Thank you. I though you had sent me a message informing me of this, but I did not see it.  Thank you. Would like to be able to get him in with [facility] as soon as we can.  Thank you.   Dr Lorin Picket

## 2020-09-27 NOTE — Telephone Encounter (Signed)
lft pt vm to call ofc regarding referral. thanks

## 2020-09-27 NOTE — Telephone Encounter (Signed)
Austin [name].  See attached.  Can you help with this and let me know what I need to do.    Thank you.

## 2020-09-27 NOTE — Telephone Encounter (Signed)
Thank you very much.   I appreciate it.  Dr Taimur Fier 

## 2020-10-04 ENCOUNTER — Telehealth: Payer: Self-pay | Admitting: Internal Medicine

## 2020-10-04 NOTE — Telephone Encounter (Signed)
lft vm at [facility] to call ofc regarding referral. thanks

## 2020-10-05 NOTE — Telephone Encounter (Signed)
[employer] called and said that had to re-mail out New pt paperwork he has until May 9th to return it

## 2020-10-05 NOTE — Telephone Encounter (Signed)
Thank you! Please see note below

## 2020-10-05 NOTE — Telephone Encounter (Signed)
Thank you for following up on this

## 2020-10-08 NOTE — Telephone Encounter (Signed)
Please make sure pt aware.

## 2020-10-08 NOTE — Telephone Encounter (Signed)
Below came to me

## 2020-10-08 NOTE — Telephone Encounter (Signed)
Pt aware.

## 2020-10-12 ENCOUNTER — Other Ambulatory Visit: Payer: Self-pay | Admitting: Gastroenterology

## 2020-10-12 ENCOUNTER — Other Ambulatory Visit: Payer: Self-pay | Admitting: Internal Medicine

## 2020-10-12 DIAGNOSIS — K2100 Gastro-esophageal reflux disease with esophagitis, without bleeding: Secondary | ICD-10-CM

## 2020-10-15 ENCOUNTER — Other Ambulatory Visit: Payer: Self-pay

## 2020-10-15 MED ORDER — PANTOPRAZOLE SODIUM 40 MG PO TBEC
DELAYED_RELEASE_TABLET | Freq: Two times a day (BID) | ORAL | 0 refills | Status: DC
  Filled 2020-10-15: qty 180, 90d supply, fill #0

## 2020-10-16 ENCOUNTER — Other Ambulatory Visit: Payer: Self-pay

## 2020-10-19 ENCOUNTER — Other Ambulatory Visit: Payer: Self-pay | Admitting: Gastroenterology

## 2020-10-19 ENCOUNTER — Other Ambulatory Visit: Payer: Self-pay

## 2020-10-23 ENCOUNTER — Other Ambulatory Visit: Payer: Self-pay

## 2020-10-23 MED ORDER — DICYCLOMINE HCL 10 MG PO CAPS
ORAL_CAPSULE | ORAL | 11 refills | Status: DC
  Filled 2020-10-23: qty 120, 30d supply, fill #0
  Filled 2021-01-30: qty 120, 30d supply, fill #1
  Filled 2021-05-13: qty 120, 30d supply, fill #2

## 2020-10-30 ENCOUNTER — Telehealth: Payer: Self-pay | Admitting: Psychiatry

## 2020-11-05 ENCOUNTER — Encounter: Payer: Self-pay | Admitting: Internal Medicine

## 2020-11-05 ENCOUNTER — Other Ambulatory Visit: Payer: Self-pay

## 2020-11-05 MED FILL — Fluoxetine HCl Cap 20 MG: ORAL | 90 days supply | Qty: 90 | Fill #0 | Status: AC

## 2020-11-05 NOTE — Telephone Encounter (Signed)
[place] sent me a message stating that the psychiatrist refused to see him given his alcohol history.  Can we send the referral to Dr Milagros Evener in [place]?  Let me know if I need to do anything.  Thanks.

## 2020-11-08 ENCOUNTER — Other Ambulatory Visit: Payer: Self-pay

## 2020-11-08 MED ORDER — TRAMADOL HCL 50 MG PO TABS
ORAL_TABLET | ORAL | 0 refills | Status: DC
  Filled 2020-11-08: qty 20, 5d supply, fill #0

## 2020-11-14 ENCOUNTER — Other Ambulatory Visit: Payer: Self-pay

## 2020-11-14 ENCOUNTER — Ambulatory Visit (INDEPENDENT_AMBULATORY_CARE_PROVIDER_SITE_OTHER): Payer: No Typology Code available for payment source | Admitting: Internal Medicine

## 2020-11-14 ENCOUNTER — Encounter: Payer: Self-pay | Admitting: Internal Medicine

## 2020-11-14 DIAGNOSIS — M25512 Pain in left shoulder: Secondary | ICD-10-CM

## 2020-11-14 DIAGNOSIS — F32A Depression, unspecified: Secondary | ICD-10-CM

## 2020-11-14 DIAGNOSIS — F320 Major depressive disorder, single episode, mild: Secondary | ICD-10-CM | POA: Diagnosis not present

## 2020-11-14 DIAGNOSIS — F1099 Alcohol use, unspecified with unspecified alcohol-induced disorder: Secondary | ICD-10-CM

## 2020-11-14 DIAGNOSIS — K219 Gastro-esophageal reflux disease without esophagitis: Secondary | ICD-10-CM

## 2020-11-14 DIAGNOSIS — D649 Anemia, unspecified: Secondary | ICD-10-CM

## 2020-11-14 DIAGNOSIS — R739 Hyperglycemia, unspecified: Secondary | ICD-10-CM | POA: Diagnosis not present

## 2020-11-14 NOTE — Progress Notes (Signed)
Patient ID: Austin [name], male   DOB: 24-Jan-1981, 40 y.o.   MRN: [id number]   Subjective:    Patient ID: Austin [name], male    DOB: Nov 22, 1980, 40 y.o.   MRN: [id number]  HPI This visit occurred during the SARS-CoV-2 public health emergency.  Safety protocols were in place, including screening questions prior to the visit, additional usage of staff PPE, and extensive cleaning of exam room while observing appropriate contact time as indicated for disinfecting solutions.  Patient here for a scheduled follow up. Here to follow up regarding increased stress, depression and reflux. Discussed last visit - increased stress with work and taking care of his kids.  Currently on prozac.  Discussed psychiatry referral.  He was agreeable.  Has had trouble getting an appt.  Discussed today.  He is interested in seeing psychiatry.  Continued increased stress as outlined.  No suicidal ideations.  Reports not exercising regularly.  Does not feel like exercising.  Feels needs more alone time with his wife.  Is eating and sleeping.  Still drinking - denies binge drinking.  Does drink after work daily.  No chest pain or sob reported.  No abdominal pain.  No acid reflux.  Bowels moving.  Saw podiatry - achilles tendonitis.  S/p injection.  Is some better.  Fell down stairs recently (4-5 steps).  No head injury.  Tried to catch himself and injured his left shoulder.  Discussed PT.  Desires to continue to monitor.    Past Medical History:  Diagnosis Date  . Alcohol abuse    history of  . Allergy   . Anxiety   . Depression   . GERD (gastroesophageal reflux disease)   . Heart murmur   . Hypertension   . Ulcer    history of bleeding ulcer requiring blood transfusions   Past Surgical History:  Procedure Laterality Date  . COLONOSCOPY WITH PROPOFOL N/A 12/12/2019   Procedure: COLONOSCOPY WITH PROPOFOL;  Surgeon: Toledo, Benay Pike, MD;  Location: [facility] ENDOSCOPY;  Service: Gastroenterology;  Laterality: N/A;  .  ESOPHAGOGASTRODUODENOSCOPY (EGD) WITH PROPOFOL N/A 12/12/2019   Procedure: ESOPHAGOGASTRODUODENOSCOPY (EGD) WITH PROPOFOL;  Surgeon: Toledo, Benay Pike, MD;  Location: [facility] ENDOSCOPY;  Service: Gastroenterology;  Laterality: N/A;  . KNEE SURGERY     Left knee - ACL  . TONSILLECTOMY AND ADENOIDECTOMY  1992   Family History  Problem Relation Age of Onset  . Alcohol abuse Father   . Cancer Father        prostate cancer  . Arthritis Sister   . Cancer Maternal Grandmother        breast cancer  . Stroke Maternal Grandmother   . Diabetes Maternal Grandmother   . Hypertension Maternal Grandmother    Social History   Socioeconomic History  . Marital status: Married    Spouse name: Not on file  . Number of children: Not on file  . Years of education: Not on file  . Highest education level: Not on file  Occupational History  . Not on file  Tobacco Use  . Smoking status: Former Smoker    Packs/day: 0.50    Types: Cigarettes  . Smokeless tobacco: Never Used  . Tobacco comment: Quit smoking recently  Vaping Use  . Vaping Use: Never used  Substance and Sexual Activity  . Alcohol use: Yes    Alcohol/week: 2.0 standard drinks    Types: 2 Cans of beer per week  . Drug use: Yes    Types: Marijuana  Comment: marijuana  . Sexual activity: Not on file  Other Topics Concern  . Not on file  Social History Narrative  . Not on file   Social Determinants of Health   Financial Resource Strain: Not on file  Food Insecurity: Not on file  Transportation Needs: Not on file  Physical Activity: Not on file  Stress: Not on file  Social Connections: Not on file    Outpatient Encounter Medications as of 11/14/2020  Medication Sig  . acetaminophen (TYLENOL) 500 MG tablet Take 500 mg by mouth 2 (two) times daily as needed.  . cetirizine (ZYRTEC) 10 MG tablet Take 10 mg by mouth daily.  Marland Kitchen dicyclomine (BENTYL) 10 MG capsule Take 10 mg by mouth 4 (four) times daily -  before meals and at bedtime.   . dicyclomine (BENTYL) 10 MG capsule TAKE 1 CAPSULE BY MOUTH 4 TIMES DAILY BEFORE MEALS AND NIGHTLY  . erythromycin ophthalmic ointment APPLY A SMALL AMOUNT INTO RIGHT EYE THREE TIMES A DAY  . FLUoxetine (PROZAC) 20 MG capsule TAKE 1 CAPSULE BY MOUTH DAILY.  Marland Kitchen FLUoxetine (PROZAC) 20 MG capsule TAKE 1 CAPSULE BY MOUTH DAILY.  . pantoprazole (PROTONIX) 40 MG tablet TAKE 1 TABLET BY MOUTH TWICE DAILY  . traMADol (ULTRAM) 50 MG tablet Take 1 tablet by mouth every 6 hours as needed for Pain for up to 5 days   No facility-administered encounter medications on file as of 11/14/2020.    Review of Systems  Constitutional: Negative for appetite change and unexpected weight change.  HENT: Negative for congestion and sinus pressure.   Respiratory: Negative for cough, chest tightness and shortness of breath.   Cardiovascular: Negative for chest pain, palpitations and leg swelling.  Gastrointestinal: Negative for abdominal pain, diarrhea, nausea and vomiting.  Genitourinary: Negative for difficulty urinating and dysuria.  Musculoskeletal: Negative for joint swelling and myalgias.       Shoulder pain as outlined.    Skin: Negative for color change and rash.  Neurological: Negative for dizziness, light-headedness and headaches.  Psychiatric/Behavioral: Negative for dysphoric mood.       Increased stress as outlined.        Objective:    Physical Exam Vitals reviewed.  Constitutional:      General: He is not in acute distress.    Appearance: Normal appearance. He is well-developed.  HENT:     Head: Normocephalic and atraumatic.     Right Ear: External ear normal.     Left Ear: External ear normal.  Eyes:     General: No scleral icterus.       Right eye: No discharge.        Left eye: No discharge.     Conjunctiva/sclera: Conjunctivae normal.  Cardiovascular:     Rate and Rhythm: Normal rate and regular rhythm.  Pulmonary:     Effort: Pulmonary effort is normal. No respiratory distress.      Breath sounds: Normal breath sounds.  Abdominal:     General: Bowel sounds are normal.     Palpations: Abdomen is soft.     Tenderness: There is no abdominal tenderness.  Musculoskeletal:        General: No swelling or tenderness.     Cervical back: Neck supple. No tenderness.  Lymphadenopathy:     Cervical: No cervical adenopathy.  Skin:    Findings: No erythema or rash.  Neurological:     Mental Status: He is alert.  Psychiatric:        Mood  and Affect: Mood normal.        Behavior: Behavior normal.     BP 130/78   Pulse 86   Temp 97.8 F (36.6 C)   Resp 16   Ht _0  (1.6 m)   Wt 182 lb (82.6 kg)   SpO2 98%   BMI 32.24 kg/m  Wt Readings from Last 3 Encounters:  11/14/20 182 lb (82.6 kg)  09/10/20 181 lb 6.4 oz (82.3 kg)  12/12/19 176 lb (79.8 kg)     Lab Results  Component Value Date   WBC 5.5 09/10/2020   HGB 14.5 09/10/2020   HCT 42.6 09/10/2020   PLT 284.0 09/10/2020   GLUCOSE 97 09/10/2020   CHOL 177 09/10/2020   TRIG 118.0 09/10/2020   HDL 65.00 09/10/2020   LDLCALC 89 09/10/2020   ALT 25 09/10/2020   AST 22 09/10/2020   NA 138 09/10/2020   K 3.6 09/10/2020   CL 100 09/10/2020   CREATININE 0.78 09/10/2020   BUN 7 09/10/2020   CO2 31 09/10/2020   TSH 0.63 09/10/2020   HGBA1C 6.0 09/10/2020       Assessment & Plan:   Problem List Items Addressed This Visit    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)    No binge drinking reported, but he is drinking daily.  Discussed treating the stress and depression.  Discussed decreasing alcohol intake.  Follow.       Anemia    S/p GI w/up 11/2019 - EGD and colonoscopy.  Follow cbc.       GERD (gastroesophageal reflux disease)    Continue protonix.  Not requiring as much TUMS.  Follow.  EGD 11/2019.        Hyperglycemia    Low carb diet and exercise.  Follow met b and a1c.       Mild depression (HCC)    Increased stress and depression as outlined.  On prozac.  Trying to get him  established with psychiatry.  No suicidal ideations.  Follow. Discussed getting out, exercising and making time to spend with his wife.        Shoulder pain    S/p injury in high school.  Will flare intermittently.  Recent fall as outlined.  Good rom.  Discussed PT.  He wants to hold at this time.  Follow.  Notify me if problems or if desires PT.           Einar Pheasant, MD

## 2020-11-15 ENCOUNTER — Encounter: Payer: Self-pay | Admitting: Internal Medicine

## 2020-11-15 DIAGNOSIS — M25519 Pain in unspecified shoulder: Secondary | ICD-10-CM | POA: Insufficient documentation

## 2020-11-15 NOTE — Assessment & Plan Note (Signed)
S/p injury in high school.  Will flare intermittently.  Recent fall as outlined.  Good rom.  Discussed PT.  He wants to hold at this time.  Follow.  Notify me if problems or if desires PT.

## 2020-11-15 NOTE — Assessment & Plan Note (Signed)
S/p GI w/up 11/2019 - EGD and colonoscopy.  Follow cbc.  °

## 2020-11-15 NOTE — Assessment & Plan Note (Signed)
Low carb diet and exercise.  Follow met b and a1c.  

## 2020-11-15 NOTE — Assessment & Plan Note (Addendum)
Increased stress and depression as outlined.  On prozac.  Trying to get him established with psychiatry.  No suicidal ideations.  Follow. Discussed getting out, exercising and making time to spend with his wife.

## 2020-11-15 NOTE — Assessment & Plan Note (Signed)
Continue protonix.  Not requiring as much TUMS.  Follow.  EGD 11/2019.

## 2020-11-15 NOTE — Assessment & Plan Note (Signed)
No binge drinking reported, but he is drinking daily.  Discussed treating the stress and depression.  Discussed decreasing alcohol intake.  Follow.

## 2020-12-11 ENCOUNTER — Encounter: Payer: Self-pay | Admitting: Internal Medicine

## 2020-12-11 DIAGNOSIS — M79673 Pain in unspecified foot: Secondary | ICD-10-CM

## 2020-12-11 DIAGNOSIS — M25512 Pain in left shoulder: Secondary | ICD-10-CM

## 2020-12-12 NOTE — Telephone Encounter (Signed)
I have placed an order for psych referral.  Dr Maryruth Bun could not see? And [facility] declined to see.  I need to get him in with a psychiatrist.  He will go to [facility].  (Dr Evelene Croon is in [place] or anyone else we can refer to). Please let me know what I need to do.

## 2020-12-27 ENCOUNTER — Other Ambulatory Visit: Payer: Self-pay

## 2020-12-27 MED ORDER — METHYLPREDNISOLONE 4 MG PO TBPK
ORAL_TABLET | ORAL | 0 refills | Status: DC
  Filled 2020-12-27: qty 21, 6d supply, fill #0

## 2021-01-02 ENCOUNTER — Ambulatory Visit: Payer: No Typology Code available for payment source | Admitting: Physical Therapy

## 2021-01-03 ENCOUNTER — Ambulatory Visit: Payer: No Typology Code available for payment source | Attending: Internal Medicine | Admitting: Physical Therapy

## 2021-01-03 ENCOUNTER — Other Ambulatory Visit: Payer: Self-pay

## 2021-01-03 VITALS — BP 158/87 | HR 90

## 2021-01-03 DIAGNOSIS — M79622 Pain in left upper arm: Secondary | ICD-10-CM | POA: Diagnosis not present

## 2021-01-03 DIAGNOSIS — M7542 Impingement syndrome of left shoulder: Secondary | ICD-10-CM | POA: Diagnosis present

## 2021-01-03 NOTE — Therapy (Addendum)
[facility] [address], [place], [address] Phone: [phone]   Fax:  [phone]  Physical Therapy Treatment/ Discharge Note   Patient Details  Name: Austin [name] MRN: [id number] Date of Birth: 1981-06-03 Referring Provider (PT): Dale , MD   Encounter Date: 01/03/2021   PT End of Session - 01/03/21 1225     Visit Number 1    Number of Visits 9    Date for PT Re-Evaluation 02/28/21    PT Start Time 1100    PT Stop Time 1145    PT Time Calculation (min) 45 min    Activity Tolerance Patient tolerated treatment well;No increased pain    Behavior During Therapy WFL for tasks assessed/performed             Past Medical History:  Diagnosis Date   Alcohol abuse    history of   Allergy    Anxiety    Depression    GERD (gastroesophageal reflux disease)    Heart murmur    Hypertension    Ulcer    history of bleeding ulcer requiring blood transfusions    Past Surgical History:  Procedure Laterality Date   COLONOSCOPY WITH PROPOFOL N/A 12/12/2019   Procedure: COLONOSCOPY WITH PROPOFOL;  Surgeon: Toledo, Boykin Nearing, MD;  Location: [facility] ENDOSCOPY;  Service: Gastroenterology;  Laterality: N/A;   ESOPHAGOGASTRODUODENOSCOPY (EGD) WITH PROPOFOL N/A 12/12/2019   Procedure: ESOPHAGOGASTRODUODENOSCOPY (EGD) WITH PROPOFOL;  Surgeon: Toledo, Boykin Nearing, MD;  Location: [facility] ENDOSCOPY;  Service: Gastroenterology;  Laterality: N/A;   KNEE SURGERY     Left knee - ACL   TONSILLECTOMY AND ADENOIDECTOMY  1992    Vitals:   01/03/21 1105  BP: (!) 158/87  Pulse: 90  SpO2: 100%     Subjective Assessment - 01/03/21 1107     Subjective Pt reports that he experiences pain when lifting left shoulder overhead. He had a recent fall a few weeks ago, but the left shoulder pain has been happening since a high school injury.    Pertinent History Reports not exercising regularly.  Does not feel like exercising.  Feels  needs more alone time with his wife.  Is eating and sleeping.  Still drinking - denies binge drinking.  Does drink after work daily.  No chest pain or sob reported.  No abdominal pain.  No acid reflux.  Bowels moving.  Saw podiatry - achilles tendonitis.  S/p injection.  Is some better.  Fell down stairs recently (4-5 steps).  No head injury.  Tried to catch himself and injured his left shoulder.  Discussed PT.  Desires to continue to monitor.    Limitations Other (comment)   Lifting children is painful and limits his ability to play with children   How long can you sit comfortably? N/a    How long can you stand comfortably? N/a    How long can you walk comfortably? N/a    Diagnostic tests None    Patient Stated Goals He wants to be able to use shoulder to reach overhead, to play with children, and to start working out.    Currently in Pain? No/denies              EVALUATION  5 D's And 3 N's: denies diplopia, drop attacks, dysarthria, dysphagia, ataxia of gait, and nystagmus; denies lightheadedness, numbness and occasional nausea   Cervical Radiculopathy Test cluster: Negative to all    -rotation AROM <60 deg towards symptomatic  side?: Negative    -Spurling's test: Negative    -response to manual traction: NT    -upper limb tension test A:     AROM (in standing):      R     L     Normal     Shoulder flexion:      160  160     180            extension:          60     60      60            abduction:          160   160   180   ER (combined reach):   Occiput                   IR (combined reach):    PSIS                  Strength:                            R            L      Shoulder flexion:          5/5         5/5             abduction:              5/5        5/5        IR (0 deg abd):           5/5         5/5           ER (0 deg abd):         5/5           5/5   ---------------------------------------  Special Tests:                      SHOULDER                       R       L Spurlings:   -       -   Screen: (R UE)   -IRRST: ER= IR    Impingement:    -Hawkins-Kennedy:  +    -pain with infraspinatus MMT:  -    -Neer: +    -painful arc: -   Rotator cuff tear:    -full can: -    -lift off: +    -drop arm: -    -belly press: +   AC joint:    -AC sheer: - Biceps Tendon    -Speeds: +  ------------------------------------ Palpation/Accessory: Left Biceps Tendon     Plan - 01/04/21 1452     Clinical Impression Statement Pt is a 40 yo male with pmh that includes depression and recent fall onto outstretched left arm with no traumatic injury that presents to initial evaluation with pain in his left shoulder. He demonstrates signs and symptoms on left shoulder indicative of biceps tendinopathy and subacromial impingment with pain on biceps tendon, overhead reaching, and with shoulder flexion. He will benefit from skilled PT to decrease his shoulder pain in order to returning to working out and playing with  his children.    Personal Factors and Comorbidities Comorbidity 2;Time since onset of injury/illness/exacerbation    Comorbidities Depression, Anxiety, Injury in High School    Examination-Activity Limitations Caring for Others;Lift    Stability/Clinical Decision Making Stable/Uncomplicated    Clinical Decision Making Low    Rehab Potential Excellent    PT Frequency 1x / week    PT Duration 8 weeks    PT Treatment/Interventions Moist Heat;Cryotherapy;Passive range of motion;Dry needling;Joint Manipulations;Therapeutic exercise;Manual techniques    PT Next Visit Plan Assess pain with overhead lift. Progress Periscapular and Shoulder Strengthening Exercises    PT Home Exercise Plan Access Code: [email or web address]    Consulted and Agree with Plan of Care Patient            HEP as follows:  Access Code: [email or web address] URL: [email or web address] Date: 01/03/2021 Prepared by: Ellin Goodie  Exercises Seated Cervical Retraction - 1 x daily -  7 x weekly - 3 sets - 10 reps Corner Pec Major Stretch - 1 x daily - 7 x weekly - 1 sets - 5 reps - 30 hold Seated Row Cable Machine - 1 x daily - 3 x weekly - 3 sets - 10 reps Median Nerve Flossing - Tray - 1 x daily - 3 x weekly - 3 sets - 10 reps  Patient will benefit from skilled therapeutic intervention in order to improve the following deficits and impairments:  Pain  Visit Diagnosis: Pain in left upper arm  Subacromial impingement of left shoulder     Problem List Patient Active Problem List   Diagnosis Date Noted   Shoulder pain 11/15/2020   Hyperglycemia 09/10/2020   Health care maintenance 01/11/2020   Lower abdominal pain 07/17/2019   Foot pain 12/15/2018   Low back pain 06/20/2018   Alcohol use with alcohol-induced disorder (HCC) 09/27/2017   Heart murmur 12/04/2016   Anemia 06/18/2015   Leg pain 04/15/2015   Snoring 04/07/2014   Daytime somnolence 04/07/2014   Chest pain 11/21/2013   Alcohol abuse 12/05/2012   Anxiety 12/05/2012   Mild depression (HCC) 12/05/2012   Tobacco abuse 12/05/2012   Gastric ulcer 12/05/2012   GERD (gastroesophageal reflux disease) 12/05/2012   Knee pain 12/05/2012   Environmental allergies 12/05/2012   Ellin Goodie PT, DPT  01/04/2021, 3:13 PM  [facility] [address] [place], [address] Phone: [phone]   Fax:  [phone]  Name: TRAMEL WESTBROOK MRN: [id number] Date of Birth: 1980-12-27

## 2021-01-04 NOTE — Addendum Note (Signed)
Addended by: Johnn Hai on: 01/04/2021 03:47 PM   Modules accepted: Orders

## 2021-01-07 ENCOUNTER — Ambulatory Visit: Payer: No Typology Code available for payment source | Admitting: Physical Therapy

## 2021-01-09 ENCOUNTER — Encounter: Payer: No Typology Code available for payment source | Admitting: Physical Therapy

## 2021-01-11 NOTE — Telephone Encounter (Signed)
Thank you very much 

## 2021-01-11 NOTE — Telephone Encounter (Signed)
I called to follow up on pt referral. Dr [facility]vel[facility]on [facility] does not take pt ins, pt can be seen there it'll be out of pocket and they're next avail appt is 07/01/2021. I'll send pt referral to [facility] if that's ok. Please advise Thank you!

## 2021-01-11 NOTE — Telephone Encounter (Signed)
Thank you much.

## 2021-01-11 NOTE — Telephone Encounter (Signed)
Yes that is ok.  Can we put a note to schedule asap.  Thanks

## 2021-01-14 ENCOUNTER — Encounter: Payer: No Typology Code available for payment source | Admitting: Physical Therapy

## 2021-01-16 ENCOUNTER — Encounter: Payer: No Typology Code available for payment source | Admitting: Physical Therapy

## 2021-01-21 ENCOUNTER — Encounter: Payer: No Typology Code available for payment source | Admitting: Physical Therapy

## 2021-01-23 ENCOUNTER — Encounter: Payer: No Typology Code available for payment source | Admitting: Physical Therapy

## 2021-01-24 ENCOUNTER — Other Ambulatory Visit: Payer: Self-pay

## 2021-01-24 ENCOUNTER — Ambulatory Visit (INDEPENDENT_AMBULATORY_CARE_PROVIDER_SITE_OTHER): Payer: No Typology Code available for payment source | Admitting: Internal Medicine

## 2021-01-24 VITALS — BP 138/78 | HR 89 | Temp 97.8°F | Resp 16 | Ht 63.0 in | Wt 183.0 lb

## 2021-01-24 DIAGNOSIS — D649 Anemia, unspecified: Secondary | ICD-10-CM | POA: Diagnosis not present

## 2021-01-24 DIAGNOSIS — Z13220 Encounter for screening for lipoid disorders: Secondary | ICD-10-CM | POA: Diagnosis not present

## 2021-01-24 DIAGNOSIS — R7303 Prediabetes: Secondary | ICD-10-CM

## 2021-01-24 DIAGNOSIS — K219 Gastro-esophageal reflux disease without esophagitis: Secondary | ICD-10-CM

## 2021-01-24 DIAGNOSIS — F320 Major depressive disorder, single episode, mild: Secondary | ICD-10-CM

## 2021-01-24 DIAGNOSIS — R739 Hyperglycemia, unspecified: Secondary | ICD-10-CM | POA: Diagnosis not present

## 2021-01-24 DIAGNOSIS — F1029 Alcohol dependence with unspecified alcohol-induced disorder: Secondary | ICD-10-CM

## 2021-01-24 DIAGNOSIS — F32A Depression, unspecified: Secondary | ICD-10-CM

## 2021-01-24 DIAGNOSIS — M7989 Other specified soft tissue disorders: Secondary | ICD-10-CM

## 2021-01-24 NOTE — Progress Notes (Signed)
Patient ID: Austin [name], male   DOB: [birth year], 40 y.o.   MRN: [id number]   Subjective:    Patient ID: Austin [name], male    DOB: January 28, 1981, 40 y.o.   MRN: [id number]  HPI This visit occurred during the SARS-CoV-2 public health emergency.  Safety protocols were in place, including screening questions prior to the visit, additional usage of staff PPE, and extensive cleaning of exam room while observing appropriate contact time as indicated for disinfecting solutions.   Patient here for a scheduled follow up.  Here to follow up regarding increased stress. Have been trying to get him established with a local psychiatrist.  On discussion today, he is no longer working.  States he desires not to see psychiatry at this time.  Working around the house.  Taking care of his children.  Wants to hold on psychiatry referral.  No suicidal ideations.  Still drinking.  Discussed the need to decrease alcohol intake.  No chest pain or sob reported.  No abdominal pain or bowel change reported.  Has noticed swelling - top of foot.  No swelling extending up his legs. No increased erythema.  Discussed compression hose. No injury.    Past Medical History:  Diagnosis Date   Alcohol abuse    history of   Allergy    Anxiety    Depression    GERD (gastroesophageal reflux disease)    Heart murmur    Hypertension    Ulcer    history of bleeding ulcer requiring blood transfusions   Past Surgical History:  Procedure Laterality Date   COLONOSCOPY WITH PROPOFOL N/A 12/12/2019   Procedure: COLONOSCOPY WITH PROPOFOL;  Surgeon: Toledo, Benay Pike, MD;  Location: [facility];  Service: Gastroenterology;  Laterality: N/A;   ESOPHAGOGASTRODUODENOSCOPY (EGD) WITH PROPOFOL N/A 12/12/2019   Procedure: ESOPHAGOGASTRODUODENOSCOPY (EGD) WITH PROPOFOL;  Surgeon: Toledo, Benay Pike, MD;  Location: [facility];  Service: Gastroenterology;  Laterality: N/A;   KNEE SURGERY     Left knee - ACL   TONSILLECTOMY AND ADENOIDECTOMY   1992   Family History  Problem Relation Age of Onset   Alcohol abuse Father    Cancer Father        prostate cancer   Arthritis Sister    Cancer Maternal Grandmother        breast cancer   Stroke Maternal Grandmother    Diabetes Maternal Grandmother    Hypertension Maternal Grandmother    Social History   Socioeconomic History   Marital status: Married    Spouse name: Not on file   Number of children: Not on file   Years of education: Not on file   Highest education level: Not on file  Occupational History   Not on file  Tobacco Use   Smoking status: Former    Packs/day: 0.50    Types: Cigarettes   Smokeless tobacco: Never   Tobacco comments:    Quit smoking recently  Vaping Use   Vaping Use: Never used  Substance and Sexual Activity   Alcohol use: Yes    Alcohol/week: 2.0 standard drinks    Types: 2 Cans of beer per week   Drug use: Yes    Types: Marijuana    Comment: marijuana   Sexual activity: Not on file  Other Topics Concern   Not on file  Social History Narrative   Not on file   Social Determinants of Health   Financial Resource Strain: Not on file  Food Insecurity: Not on file  Transportation Needs: Not on file  Physical Activity: Not on file  Stress: Not on file  Social Connections: Not on file    Review of Systems  Constitutional:  Negative for appetite change and unexpected weight change.  HENT:  Negative for congestion and sinus pressure.   Respiratory:  Negative for cough, chest tightness and shortness of breath.   Cardiovascular:  Negative for chest pain and palpitations.       Foot swelling as outlined.    Gastrointestinal:  Negative for abdominal pain, diarrhea, nausea and vomiting.  Genitourinary:  Negative for difficulty urinating and dysuria.  Musculoskeletal:  Negative for joint swelling and myalgias.  Neurological:  Negative for dizziness, light-headedness and headaches.  Psychiatric/Behavioral:  Negative for agitation and  dysphoric mood.       Objective:    Physical Exam Vitals reviewed.  Constitutional:      General: He is not in acute distress.    Appearance: Normal appearance. He is well-developed.  HENT:     Head: Normocephalic and atraumatic.     Right Ear: External ear normal.     Left Ear: External ear normal.  Eyes:     General: No scleral icterus.       Right eye: No discharge.        Left eye: No discharge.     Conjunctiva/sclera: Conjunctivae normal.  Cardiovascular:     Rate and Rhythm: Normal rate and regular rhythm.  Pulmonary:     Effort: Pulmonary effort is normal. No respiratory distress.     Breath sounds: Normal breath sounds.  Abdominal:     General: Bowel sounds are normal.     Palpations: Abdomen is soft.     Tenderness: There is no abdominal tenderness.  Musculoskeletal:        General: No tenderness.     Cervical back: Neck supple. No tenderness.     Comments: Increased swelling - foot - top of foot.  No increased erythema.  No swelling extending up the leg.  No pain.   Lymphadenopathy:     Cervical: No cervical adenopathy.  Skin:    Findings: No erythema or rash.  Neurological:     Mental Status: He is alert.  Psychiatric:        Mood and Affect: Mood normal.        Behavior: Behavior normal.    BP 138/78   Pulse 89   Temp 97.8 F (36.6 C)   Resp 16   Ht 5' 3"  (1.6 m)   Wt 183 lb (83 kg)   SpO2 98%   BMI 32.42 kg/m  Wt Readings from Last 3 Encounters:  01/24/21 183 lb (83 kg)  11/14/20 182 lb (82.6 kg)  09/10/20 181 lb 6.4 oz (82.3 kg)    Outpatient Encounter Medications as of 01/24/2021  Medication Sig   acetaminophen (TYLENOL) 500 MG tablet Take 500 mg by mouth 2 (two) times daily as needed.   cetirizine (ZYRTEC) 10 MG tablet Take 10 mg by mouth daily.   dicyclomine (BENTYL) 10 MG capsule Take 10 mg by mouth 4 (four) times daily -  before meals and at bedtime.   dicyclomine (BENTYL) 10 MG capsule TAKE 1 CAPSULE BY MOUTH 4 TIMES DAILY BEFORE  MEALS AND NIGHTLY   erythromycin ophthalmic ointment APPLY A SMALL AMOUNT INTO RIGHT EYE THREE TIMES A DAY   FLUoxetine (PROZAC) 20 MG capsule TAKE 1 CAPSULE BY MOUTH DAILY.   pantoprazole (PROTONIX) 40 MG tablet TAKE 1 TABLET  BY MOUTH TWICE DAILY   [DISCONTINUED] methylPREDNISolone (MEDROL DOSEPAK) 4 MG TBPK tablet Follow package directions.   [DISCONTINUED] traMADol (ULTRAM) 50 MG tablet Take 1 tablet by mouth every 6 hours as needed for Pain for up to 5 days   [DISCONTINUED] FLUoxetine (PROZAC) 20 MG capsule TAKE 1 CAPSULE BY MOUTH DAILY.   No facility-administered encounter medications on file as of 01/24/2021.     Lab Results  Component Value Date   WBC 5.5 09/10/2020   HGB 14.5 09/10/2020   HCT 42.6 09/10/2020   PLT 284.0 09/10/2020   GLUCOSE 104 (H) 01/28/2021   CHOL 222 (H) 01/28/2021   TRIG 107.0 01/28/2021   HDL 63.30 01/28/2021   LDLCALC 138 (H) 01/28/2021   ALT 24 01/28/2021   AST 18 01/28/2021   NA 137 01/28/2021   K 4.0 01/28/2021   CL 99 01/28/2021   CREATININE 0.86 01/28/2021   BUN 9 01/28/2021   CO2 27 01/28/2021   TSH 0.63 09/10/2020   HGBA1C 6.4 01/28/2021       Assessment & Plan:   Problem List Items Addressed This Visit     Alcohol dependence ([place])    He is drinking daily.  Denies any binge drinking.  Discussed the need to decrease alcohol intake.  Wants to hold on psychiatry referral.  Follow.        Anemia    S/p GI w/up 11/2019 - EGD and colonoscopy.  Follow cbc.        Relevant Orders   Basic metabolic panel (Completed)   Hepatic function panel (Completed)   Foot swelling    Swelling - top of foot.  No injury or trauma.  No erythema.  Better in am.  Discussed compression hose.  Elevation.  Follow.  No evidence of volume overload.  Follow.        GERD (gastroesophageal reflux disease)    Continue protonix.  Follow.  EGD 11/2019.  No upper symptoms reported.        Hyperglycemia - Primary    Low carb diet and exercise.  Follow met b  and a1c.        Relevant Orders   Hemoglobin A1c (Completed)   Mild depression (HCC)    Increased stress - previously noted.  Not working now.  On prozac.  Declines psychiatry referral.  Follow.         Other Visit Diagnoses     Screening cholesterol level       Relevant Orders   Lipid panel (Completed)        Einar Pheasant, MD

## 2021-01-28 ENCOUNTER — Other Ambulatory Visit (INDEPENDENT_AMBULATORY_CARE_PROVIDER_SITE_OTHER): Payer: No Typology Code available for payment source

## 2021-01-28 ENCOUNTER — Encounter: Payer: No Typology Code available for payment source | Admitting: Physical Therapy

## 2021-01-28 ENCOUNTER — Other Ambulatory Visit: Payer: Self-pay

## 2021-01-28 DIAGNOSIS — Z13220 Encounter for screening for lipoid disorders: Secondary | ICD-10-CM | POA: Diagnosis not present

## 2021-01-28 DIAGNOSIS — R739 Hyperglycemia, unspecified: Secondary | ICD-10-CM | POA: Diagnosis not present

## 2021-01-28 DIAGNOSIS — D649 Anemia, unspecified: Secondary | ICD-10-CM | POA: Diagnosis not present

## 2021-01-28 LAB — HEPATIC FUNCTION PANEL
ALT: 24 U/L (ref 0–53)
AST: 18 U/L (ref 0–37)
Albumin: 4.7 g/dL (ref 3.5–5.2)
Alkaline Phosphatase: 81 U/L (ref 39–117)
Bilirubin, Direct: 0.1 mg/dL (ref 0.0–0.3)
Total Bilirubin: 0.7 mg/dL (ref 0.2–1.2)
Total Protein: 7.7 g/dL (ref 6.0–8.3)

## 2021-01-28 LAB — LIPID PANEL
Cholesterol: 222 mg/dL — ABNORMAL HIGH (ref 0–200)
HDL: 63.3 mg/dL (ref >39.00)
LDL Cholesterol: 138 mg/dL — ABNORMAL HIGH (ref 0–99)
NonHDL: 159.19
Total CHOL/HDL Ratio: 4
Triglycerides: 107 mg/dL (ref 0.0–149.0)
VLDL: 21.4 mg/dL (ref 0.0–40.0)

## 2021-01-28 LAB — BASIC METABOLIC PANEL
BUN: 9 mg/dL (ref 6–23)
CO2: 27 mEq/L (ref 19–32)
Calcium: 9.6 mg/dL (ref 8.4–10.5)
Chloride: 99 mEq/L (ref 96–112)
Creatinine, Ser: 0.86 mg/dL (ref 0.40–1.50)
GFR: 109.19 mL/min (ref >60.00)
Glucose, Bld: 104 mg/dL — ABNORMAL HIGH (ref 70–99)
Potassium: 4 mEq/L (ref 3.5–5.1)
Sodium: 137 mEq/L (ref 135–145)

## 2021-01-28 LAB — HEMOGLOBIN A1C: Hgb A1c MFr Bld: 6.4 % (ref 4.6–6.5)

## 2021-01-29 ENCOUNTER — Encounter: Payer: Self-pay | Admitting: Internal Medicine

## 2021-01-29 DIAGNOSIS — M7989 Other specified soft tissue disorders: Secondary | ICD-10-CM | POA: Insufficient documentation

## 2021-01-29 NOTE — Assessment & Plan Note (Signed)
He is drinking daily.  Denies any binge drinking.  Discussed the need to decrease alcohol intake.  Wants to hold on psychiatry referral.  Follow.

## 2021-01-29 NOTE — Assessment & Plan Note (Signed)
S/p GI w/up 11/2019 - EGD and colonoscopy.  Follow cbc.  °

## 2021-01-29 NOTE — Assessment & Plan Note (Signed)
Low carb diet and exercise.  Follow met b and a1c.  

## 2021-01-29 NOTE — Assessment & Plan Note (Signed)
Increased stress - previously noted.  Not working now.  On prozac.  Declines psychiatry referral.  Follow.

## 2021-01-29 NOTE — Assessment & Plan Note (Signed)
Continue protonix.  Follow.  EGD 11/2019.  No upper symptoms reported.  

## 2021-01-29 NOTE — Assessment & Plan Note (Signed)
Swelling - top of foot.  No injury or trauma.  No erythema.  Better in am.  Discussed compression hose.  Elevation.  Follow.  No evidence of volume overload.  Follow.

## 2021-01-30 ENCOUNTER — Encounter: Payer: No Typology Code available for payment source | Admitting: Physical Therapy

## 2021-01-31 ENCOUNTER — Other Ambulatory Visit: Payer: Self-pay

## 2021-02-04 ENCOUNTER — Encounter: Payer: No Typology Code available for payment source | Admitting: Physical Therapy

## 2021-02-06 ENCOUNTER — Encounter: Payer: No Typology Code available for payment source | Admitting: Physical Therapy

## 2021-02-07 ENCOUNTER — Encounter: Payer: Self-pay | Admitting: Internal Medicine

## 2021-02-09 NOTE — Telephone Encounter (Signed)
See if he would be agreeable to meet with Catie to discuss treatment options, insurance options, etc.  If ok can place referral.

## 2021-02-11 ENCOUNTER — Encounter: Payer: No Typology Code available for payment source | Admitting: Physical Therapy

## 2021-02-12 ENCOUNTER — Telehealth: Payer: Self-pay

## 2021-02-12 NOTE — Addendum Note (Signed)
Addended by: Tilford Pillar on: 02/12/2021 07:39 AM   Modules accepted: Orders

## 2021-02-12 NOTE — Chronic Care Management (AMB) (Signed)
  Care Management   Note  02/12/2021 Name: [name] MRN: [id number] DOB: [birth year]  Austin [name] is a 40 y.o. year old male who is a primary care patient of Dale Triumph, MD. I reached out to [name] by phone today in response to a referral sent by Mr. Carmie End Tanabe's health plan.    Mr. Failla was given information about care management services today including:  Care management services include personalized support from designated clinical staff supervised by his physician, including individualized plan of care and coordination with other care providers 24/7 contact phone numbers for assistance for urgent and routine care needs. The patient may stop care management services at any time by phone call to the office staff.  Patient agreed to services and verbal consent obtained.   Follow up plan: Telephone appointment with care management team member scheduled for:02/14/2021  Penne Lash, RMA Care Guide, Embedded Care Coordination [facility]  [place], [place] [address] Direct Dial: [phone] [email or web address]

## 2021-02-13 ENCOUNTER — Encounter: Payer: No Typology Code available for payment source | Admitting: Physical Therapy

## 2021-02-14 ENCOUNTER — Other Ambulatory Visit: Payer: Self-pay

## 2021-02-14 ENCOUNTER — Telehealth: Payer: No Typology Code available for payment source | Admitting: Physician Assistant

## 2021-02-14 ENCOUNTER — Ambulatory Visit: Payer: No Typology Code available for payment source | Admitting: Pharmacist

## 2021-02-14 DIAGNOSIS — R7303 Prediabetes: Secondary | ICD-10-CM

## 2021-02-14 DIAGNOSIS — L739 Follicular disorder, unspecified: Secondary | ICD-10-CM

## 2021-02-14 DIAGNOSIS — F32A Depression, unspecified: Secondary | ICD-10-CM

## 2021-02-14 DIAGNOSIS — F320 Major depressive disorder, single episode, mild: Secondary | ICD-10-CM

## 2021-02-14 DIAGNOSIS — R739 Hyperglycemia, unspecified: Secondary | ICD-10-CM

## 2021-02-14 MED ORDER — OZEMPIC (0.25 OR 0.5 MG/DOSE) 2 MG/1.5ML ~~LOC~~ SOPN
PEN_INJECTOR | SUBCUTANEOUS | 2 refills | Status: DC
Start: 2021-02-14 — End: 2021-05-28
  Filled 2021-02-14: qty 1.5, 42d supply, fill #0
  Filled 2021-03-27: qty 3, 56d supply, fill #1

## 2021-02-14 MED ORDER — DOXYCYCLINE HYCLATE 100 MG PO TABS
100.0000 mg | ORAL_TABLET | Freq: Two times a day (BID) | ORAL | 0 refills | Status: DC
  Filled 2021-02-14: qty 14, 7d supply, fill #0

## 2021-02-14 NOTE — Progress Notes (Signed)
  E Visit for Folliculitis   We are sorry you are not feeling well.  Here is how we plan to help!  Based on what you have shared with me it looks like you have folliculitis versus true acne vulgaris/cystic acne.  Folliculitis refers to inflammation of the superficial or deep portio of the hair follicle.  It can be infectious or non-infectious. Various bacteria, fungi, viruses, and parasites can cause infectious folliculitis. I have prescribed: Doxycycline 100 mg twice per day for 7 days    HOME CARE: Apply a warm, moist washcloth or compress using a saltwater solution (1 teaspoon of table salt to 2 cups water) Apply over the counter antibiotic cream, gel or wash  Apply soothing lotions such as oatmeal lotion or over the counter hydrocortisone cream Clean the affected skin twice daily with antibacterial soap. Use clean washcloth and towel each time and do not share with anyone.  Wash these items and clothes that have touched the area with hot soapy water. Protect the skin. If possible avoid shaving.  If you must shave, try an Neurosurgeon.  When done, rinse skin with warm water and apply moisturizer.  GET HELP RIGHT AWAY IF: You have extensive skin involvement or the symptoms return after treatment Symptoms don't go away after treatment. Severe itching that persists. If you rash spreads or swells. If you rash begins to smell. If it blisters and opens or develops a yellow-brown crust. You develop a fever. You have a sore throat. You become short of breath.  MAKE SURE YOU:  Understand these instructions. Will watch your condition. Will get help right away if you are not doing well or get worse.  Thank you for choosing an e-visit.  Your e-visit answers were reviewed by a board certified advanced clinical practitioner to complete your personal care plan. Depending upon the condition, your plan could have included both over the counter or prescription medications.  Please review your  pharmacy choice. Make sure the pharmacy is open so you can pick up prescription now. If there is a problem, you may contact your provider through [employer] and have the prescription routed to another pharmacy.  Your safety is important to [place]. If you have drug allergies check your prescription carefully.   For the next 24 hours you can use MyChart to ask questions about today's visit, request a non-urgent call back, or ask for a work or school excuse. You will get an email in the next two days asking about your experience. I hope that your e-visit has been valuable and will speed your recovery.

## 2021-02-14 NOTE — Chronic Care Management (AMB) (Signed)
Care Management   Pharmacy Note  02/14/2021 Name: MART COLPITTS MRN: [id number] DOB: 1980/12/29  Subjective: Austin [name] is a 40 y.o. year old male who is a primary care patient of Dale [place], MD. The Care Management team was consulted for assistance with care management and care coordination needs.    Engaged with patient by telephone for initial visit in response to provider referral for pharmacy case management and/or care coordination services.   The patient was given information about Care Management services today including:  Care Management services includes personalized support from designated clinical staff supervised by the patient's primary care provider, including individualized plan of care and coordination with other care providers. 24/7 contact phone numbers for assistance for urgent and routine care needs. The patient may stop case management services at any time by phone call to the office staff.  Patient agreed to services and consent obtained.  Assessment:  Review of patient status, including review of consultants reports, laboratory and other test data, was performed as part of comprehensive evaluation and provision of chronic care management services.   SDOH (Social Determinants of Health) assessments and interventions performed:  SDOH Interventions    Flowsheet Row Most Recent Value  SDOH Interventions   Financial Strain Interventions Intervention Not Indicated        Objective:  Lab Results  Component Value Date   CREATININE 0.86 01/28/2021   CREATININE 0.78 09/10/2020   CREATININE 0.81 01/14/2019    Lab Results  Component Value Date   HGBA1C 6.4 01/28/2021       Component Value Date/Time   CHOL 222 (H) 01/28/2021 0839   CHOL 169 02/02/2018 0841   TRIG 107.0 01/28/2021 0839   HDL 63.30 01/28/2021 0839   HDL 62 02/02/2018 0841   CHOLHDL 4 01/28/2021 0839   VLDL 21.4 01/28/2021 0839   LDLCALC 138 (H) 01/28/2021 0839   LDLCALC 84  02/02/2018 0841     Clinical ASCVD: No  The ASCVD Risk score (Goff DC Jr., et al., 2013) failed to calculate for the following reasons:   The 2013 ASCVD risk score is only valid for ages 58 to 31    BP Readings from Last 3 Encounters:  01/24/21 138/78  01/03/21 (!) 158/87  11/14/20 130/78    Care Plan  Allergies  Allergen Reactions   Sulfa Antibiotics     Medications Reviewed Today     Reviewed by Lourena Simmonds, RPH-CPP (Pharmacist) on 02/14/21 at 1005  Med List Status: <None>   Medication Order Taking? Sig Documenting Provider Last Dose Status Informant  acetaminophen (TYLENOL) 500 MG tablet [id number] Yes Take 500 mg by mouth 2 (two) times daily as needed. [provider] Taking Active   Ascorbic Acid (VITAMIN C) 100 MG tablet [id number] Yes Take 100 mg by mouth daily. [provider] Taking Active   cetirizine (ZYRTEC) 10 MG tablet [id number] Yes Take 10 mg by mouth daily. [provider] Taking Active   dicyclomine (BENTYL) 10 MG capsule [id number] Yes TAKE 1 CAPSULE BY MOUTH 4 TIMES DAILY BEFORE MEALS AND NIGHTLY  Taking Active   FLUoxetine (PROZAC) 20 MG capsule [id number] Yes TAKE 1 CAPSULE BY MOUTH DAILY. Dale [place], MD Taking Active   pantoprazole (PROTONIX) 40 MG tablet [id number] Yes TAKE 1 TABLET BY MOUTH TWICE DAILY Dale [place], MD Taking Active             Patient Active Problem List   Diagnosis Date Noted   Foot swelling 01/29/2021  Shoulder pain 11/15/2020   Hyperglycemia 09/10/2020   Health care maintenance 01/11/2020   Lower abdominal pain 07/17/2019   Foot pain 12/15/2018   Low back pain 06/20/2018   Alcohol dependence (HCC) 09/27/2017   Heart murmur 12/04/2016   Anemia 06/18/2015   Leg pain 04/15/2015   Snoring 04/07/2014   Daytime somnolence 04/07/2014   Chest pain 11/21/2013   Alcohol abuse 12/05/2012   Anxiety 12/05/2012   Mild depression (HCC) 12/05/2012   Tobacco abuse 12/05/2012   Gastric ulcer  12/05/2012   GERD (gastroesophageal reflux disease) 12/05/2012   Knee pain 12/05/2012   Environmental allergies 12/05/2012    Conditions to be addressed/monitored:  Prediabetes  Care Plan : Medication Monitoring  Updates made by Lourena Simmonds, RPH-CPP since 02/14/2021 12:00 AM     Problem: Prediabetes      Long-Range Goal: Disease Progression Prevention   Start Date: 02/14/2021  This Visit's Progress: On track  Priority: High  Note:   Current Barriers:  Unable to achieve control of A1c   Pharmacist Clinical Goal(s):  Over the next 90 days, patient will achieve control of A1c through collaboration with PharmD and provider.   Interventions: 1:1 collaboration with Dale Balfour, MD regarding development and update of comprehensive plan of care as evidenced by provider attestation and co-signature Inter-disciplinary care team collaboration (see longitudinal plan of care) Comprehensive medication review performed; medication list updated in electronic medical record  Pre-diabetes: Recently diagnosed, patient reported interest in Ozempic as his wife is currently on it Counseled on GLP1 agonists, including mechanism of action, side effects, and benefits. No personal or family history of medullary thyroid cancer, personal history of pancreatitis or gallbladder disease. Counseled on potential side effects of nausea, stomach upset, queasiness, constipation, and that these generally improve over time. Advised to contact our office with more severe symptoms, including nausea, diarrhea, stomach pain. Patient verbalized understanding. Discussed current stocking issues with Ozempic. Discussed availability of Rybelsus instead. Patient requests script for Ozempic and will let us know if the pharmacy is unable to get in.  Start Ozempic 0.25 mg weekly for 4 weeks, then increase to 0.5 mg weekly.   Depression: Moderately well controlled per last visit w/ PCP; current treatment: fluoxetine 20  mg dialy;  Recommended to continue current regimen at this time  GERD/loose stools Controlled per patient report at last visit w/ PCP; current regimen: pantoprazole 40 mg BID, dicyclomine 10 mg QID PRN Recommended to continue current regimen at this time. Follow for any exacerbation of disease w/ addition of GLP1.    Patient Goals/Self-Care Activities Over the next 90 days, patient will:  - take medications as prescribed  Follow Up Plan: Declines need to schedule f/u at this time      Medication Assistance:  None required.  Patient affirms current coverage meets needs.  Follow Up:  Patient requests no follow-up at this time.  Plan: Patient has my contact information for future questions or concerns  Catie Feliz Beam, PharmD, [place], CPP Clinical Pharmacist [employer] [facility] [employer] [phone]

## 2021-02-14 NOTE — Patient Instructions (Signed)
Visit Information  PATIENT GOALS:   Goals Addressed               This Visit's Progress     Patient Stated     Medication Monitoring (pt-stated)        Patient Goals/Self-Care Activities Over the next 90 days, patient will:  - take medications as prescribed        Mr. Monts was given information about Care Management services by the embedded care coordination team including:  Care Management services include personalized support from designated clinical staff supervised by his physician, including individualized plan of care and coordination with other care providers 24/7 contact phone numbers for assistance for urgent and routine care needs. The patient may stop CCM services at any time (effective at the end of the month) by phone call to the office staff.  Patient agreed to services and verbal consent obtained.   Patient verbalizes understanding of instructions provided today and agrees to view in MyChart.    Plan: Patient has my contact information for future questions or concerns  Catie Feliz Beam, PharmD, [place], CPP Clinical Pharmacist [employer] [employer] [phone]

## 2021-02-14 NOTE — Progress Notes (Signed)
I have spent 5 minutes in review of e-visit questionnaire, review and updating patient chart, medical decision making and response to patient.   Kendyll Huettner Cody Beaumont [place], PA-C    

## 2021-02-15 ENCOUNTER — Other Ambulatory Visit: Payer: Self-pay

## 2021-02-18 ENCOUNTER — Encounter: Payer: Self-pay | Admitting: Internal Medicine

## 2021-02-18 ENCOUNTER — Encounter: Payer: No Typology Code available for payment source | Admitting: Physical Therapy

## 2021-02-18 DIAGNOSIS — K2100 Gastro-esophageal reflux disease with esophagitis, without bleeding: Secondary | ICD-10-CM

## 2021-02-18 MED ORDER — PANTOPRAZOLE SODIUM 40 MG PO TBEC
DELAYED_RELEASE_TABLET | Freq: Two times a day (BID) | ORAL | 1 refills | Status: DC
  Filled 2021-02-21: qty 180, 90d supply, fill #0
  Filled 2021-05-27: qty 180, 90d supply, fill #1

## 2021-02-20 ENCOUNTER — Encounter: Payer: No Typology Code available for payment source | Admitting: Physical Therapy

## 2021-02-21 ENCOUNTER — Other Ambulatory Visit: Payer: Self-pay

## 2021-02-25 ENCOUNTER — Encounter: Payer: No Typology Code available for payment source | Admitting: Physical Therapy

## 2021-02-27 ENCOUNTER — Encounter: Payer: No Typology Code available for payment source | Admitting: Physical Therapy

## 2021-03-03 ENCOUNTER — Other Ambulatory Visit: Payer: Self-pay | Admitting: Internal Medicine

## 2021-03-04 ENCOUNTER — Other Ambulatory Visit: Payer: Self-pay | Admitting: Internal Medicine

## 2021-03-04 ENCOUNTER — Other Ambulatory Visit: Payer: Self-pay

## 2021-03-05 ENCOUNTER — Other Ambulatory Visit: Payer: Self-pay

## 2021-03-05 MED FILL — Fluoxetine HCl Cap 20 MG: ORAL | 90 days supply | Qty: 90 | Fill #0 | Status: AC

## 2021-03-25 ENCOUNTER — Ambulatory Visit (INDEPENDENT_AMBULATORY_CARE_PROVIDER_SITE_OTHER): Payer: No Typology Code available for payment source | Admitting: Internal Medicine

## 2021-03-25 ENCOUNTER — Other Ambulatory Visit: Payer: Self-pay

## 2021-03-25 ENCOUNTER — Encounter: Payer: Self-pay | Admitting: Internal Medicine

## 2021-03-25 VITALS — BP 128/80 | HR 78 | Temp 97.9°F | Resp 16 | Ht 63.0 in | Wt 170.0 lb

## 2021-03-25 DIAGNOSIS — M79646 Pain in unspecified finger(s): Secondary | ICD-10-CM

## 2021-03-25 DIAGNOSIS — R739 Hyperglycemia, unspecified: Secondary | ICD-10-CM | POA: Diagnosis not present

## 2021-03-25 DIAGNOSIS — Z23 Encounter for immunization: Secondary | ICD-10-CM | POA: Diagnosis not present

## 2021-03-25 DIAGNOSIS — K219 Gastro-esophageal reflux disease without esophagitis: Secondary | ICD-10-CM

## 2021-03-25 DIAGNOSIS — F320 Major depressive disorder, single episode, mild: Secondary | ICD-10-CM

## 2021-03-25 DIAGNOSIS — F419 Anxiety disorder, unspecified: Secondary | ICD-10-CM

## 2021-03-25 DIAGNOSIS — F1029 Alcohol dependence with unspecified alcohol-induced disorder: Secondary | ICD-10-CM

## 2021-03-25 DIAGNOSIS — F32A Depression, unspecified: Secondary | ICD-10-CM

## 2021-03-25 DIAGNOSIS — D649 Anemia, unspecified: Secondary | ICD-10-CM

## 2021-03-25 DIAGNOSIS — M7989 Other specified soft tissue disorders: Secondary | ICD-10-CM | POA: Diagnosis not present

## 2021-03-25 NOTE — Patient Instructions (Signed)
Thumb spica splint

## 2021-03-25 NOTE — Progress Notes (Signed)
Patient ID: STEPHANE NIEMANN, male   DOB: 1980/07/16, 40 y.o.   MRN: [id number]   Subjective:    Patient ID: MICK TANGUMA, male    DOB: 05/11/1981, 40 y.o.   MRN: [id number]  This visit occurred during the SARS-CoV-2 public health emergency.  Safety protocols were in place, including screening questions prior to the visit, additional usage of staff PPE, and extensive cleaning of exam room while observing appropriate contact time as indicated for disinfecting solutions.   Patient here for a scheduled follow up.   Chief Complaint  Patient presents with   Depression   Leg Swelling   .   HPI Doing better.  Started ozempic.  Had .56m dose this weekend.  Has lost weight.  Is eating.  Has curbed appetite.  Has also led to decrease alcohol intake.  Has a new job.  Still working from home.  Feels better.  Stress is better.  No chest pain or sob reported.  No abdominal pain.  Bowels moving.  Is having problems with pain - base of thumb - right > left.  Uses hands a lot.  Also discussed swelling - foot. No swelling extending up leg.  Discussed compression hose.     Past Medical History:  Diagnosis Date   Alcohol abuse    history of   Allergy    Anxiety    Depression    GERD (gastroesophageal reflux disease)    Heart murmur    Hypertension    Ulcer    history of bleeding ulcer requiring blood transfusions   Past Surgical History:  Procedure Laterality Date   COLONOSCOPY WITH PROPOFOL N/A 12/12/2019   Procedure: COLONOSCOPY WITH PROPOFOL;  Surgeon: Toledo, TBenay Pike MD;  Location: [facility];  Service: Gastroenterology;  Laterality: N/A;   ESOPHAGOGASTRODUODENOSCOPY (EGD) WITH PROPOFOL N/A 12/12/2019   Procedure: ESOPHAGOGASTRODUODENOSCOPY (EGD) WITH PROPOFOL;  Surgeon: Toledo, TBenay Pike MD;  Location: [facility];  Service: Gastroenterology;  Laterality: N/A;   KNEE SURGERY     Left knee - ACL   TONSILLECTOMY AND ADENOIDECTOMY  1992   Family History  Problem Relation Age of Onset    Alcohol abuse Father    Cancer Father        prostate cancer   Arthritis Sister    Cancer Maternal Grandmother        breast cancer   Stroke Maternal Grandmother    Diabetes Maternal Grandmother    Hypertension Maternal Grandmother    Social History   Socioeconomic History   Marital status: Married    Spouse name: Not on file   Number of children: Not on file   Years of education: Not on file   Highest education level: Not on file  Occupational History   Not on file  Tobacco Use   Smoking status: Former    Packs/day: 0.50    Types: Cigarettes   Smokeless tobacco: Never   Tobacco comments:    Quit smoking recently  Vaping Use   Vaping Use: Never used  Substance and Sexual Activity   Alcohol use: Yes    Alcohol/week: 2.0 standard drinks    Types: 2 Cans of beer per week   Drug use: Yes    Types: Marijuana    Comment: marijuana   Sexual activity: Not on file  Other Topics Concern   Not on file  Social History Narrative   Not on file   Social Determinants of Health   Financial Resource Strain: Low Risk  Difficulty of Paying Living Expenses: Not hard at all  Food Insecurity: Not on file  Transportation Needs: Not on file  Physical Activity: Not on file  Stress: Not on file  Social Connections: Not on file    Review of Systems  Constitutional:  Negative for appetite change and unexpected weight change.  HENT:  Negative for congestion and sinus pressure.   Respiratory:  Negative for cough, chest tightness and shortness of breath.   Cardiovascular:  Negative for chest pain and palpitations.       No increased leg swelling as outlined.   Gastrointestinal:  Negative for abdominal pain, diarrhea, nausea and vomiting.  Genitourinary:  Negative for difficulty urinating and dysuria.  Musculoskeletal:  Negative for myalgias.       Increased pain - base of thumb - right>left.   Skin:  Negative for color change and rash.  Neurological:  Negative for dizziness,  light-headedness and headaches.  Psychiatric/Behavioral:  Negative for agitation and dysphoric mood.       Objective:     BP 128/80   Pulse 78   Temp 97.9 F (36.6 C)   Resp 16   Ht $R'5\' 3"'Of$  (1.6 m)   Wt 170 lb (77.1 kg)   SpO2 99%   BMI 30.11 kg/m  Wt Readings from Last 3 Encounters:  03/25/21 170 lb (77.1 kg)  01/24/21 183 lb (83 kg)  11/14/20 182 lb (82.6 kg)    Physical Exam Vitals reviewed.  Constitutional:      General: He is not in acute distress.    Appearance: Normal appearance. He is well-developed.  HENT:     Head: Normocephalic and atraumatic.     Right Ear: External ear normal.     Left Ear: External ear normal.  Eyes:     General: No scleral icterus.       Right eye: No discharge.        Left eye: No discharge.     Conjunctiva/sclera: Conjunctivae normal.  Cardiovascular:     Rate and Rhythm: Normal rate and regular rhythm.  Pulmonary:     Effort: Pulmonary effort is normal. No respiratory distress.     Breath sounds: Normal breath sounds.  Abdominal:     General: Bowel sounds are normal.     Palpations: Abdomen is soft.     Tenderness: There is no abdominal tenderness.  Musculoskeletal:        General: No swelling or tenderness.     Cervical back: Neck supple. No tenderness.  Lymphadenopathy:     Cervical: No cervical adenopathy.  Skin:    Findings: No erythema or rash.  Neurological:     Mental Status: He is alert.  Psychiatric:        Mood and Affect: Mood normal.        Behavior: Behavior normal.     Outpatient Encounter Medications as of 03/25/2021  Medication Sig   acetaminophen (TYLENOL) 500 MG tablet Take 500 mg by mouth 2 (two) times daily as needed.   Ascorbic Acid (VITAMIN C) 100 MG tablet Take 100 mg by mouth daily.   cetirizine (ZYRTEC) 10 MG tablet Take 10 mg by mouth daily.   dicyclomine (BENTYL) 10 MG capsule TAKE 1 CAPSULE BY MOUTH 4 TIMES DAILY BEFORE MEALS AND NIGHTLY   doxycycline (VIBRA-TABS) 100 MG tablet Take 1 tablet  (100 mg total) by mouth 2 (two) times daily.   FLUoxetine (PROZAC) 20 MG capsule TAKE 1 CAPSULE BY MOUTH DAILY.   pantoprazole (  PROTONIX) 40 MG tablet TAKE 1 TABLET BY MOUTH TWICE DAILY   Semaglutide,0.25 or 0.5MG/DOS, (OZEMPIC, 0.25 OR 0.5 MG/DOSE,) 2 MG/1.5ML SOPN Inject 0.25 mg weekly for 4 weeks, then increase to 0.5 mg weekly   No facility-administered encounter medications on file as of 03/25/2021.     Lab Results  Component Value Date   WBC 5.5 09/10/2020   HGB 14.5 09/10/2020   HCT 42.6 09/10/2020   PLT 284.0 09/10/2020   GLUCOSE 104 (H) 01/28/2021   CHOL 222 (H) 01/28/2021   TRIG 107.0 01/28/2021   HDL 63.30 01/28/2021   LDLCALC 138 (H) 01/28/2021   ALT 24 01/28/2021   AST 18 01/28/2021   NA 137 01/28/2021   K 4.0 01/28/2021   CL 99 01/28/2021   CREATININE 0.86 01/28/2021   BUN 9 01/28/2021   CO2 27 01/28/2021   TSH 0.63 09/10/2020   HGBA1C 6.4 01/28/2021       Assessment & Plan:   Problem List Items Addressed This Visit     Alcohol dependence ([place])    Has decreased alcohol intake.  Follow.       Anemia    S/p GI w/up 11/2019 - EGD and colonoscopy.  Follow cbc.       Anxiety    Doing well on prozac.  Stress is better.  Follow.       Foot swelling    Discussed compression hose (full toe).  Follow.       GERD (gastroesophageal reflux disease)    Continue protonix.  Follow.  EGD 11/2019.  No upper symptoms reported.       Hyperglycemia    Low carb diet and exercise.  Follow met b and a1c.       Mild depression    Continues on prozac.  Doing better.  Follow.        Thumb pain    Right > left - thumb spica splint.  Avoid increased remote control, cell phone use, etc.  Follow.       Other Visit Diagnoses     Need for immunization against influenza    -  Primary   Relevant Orders   Flu Vaccine QUAD 21moIM (Fluarix, Fluzone & Alfiuria Quad PF) (Completed)        CEinar Pheasant MD

## 2021-03-27 ENCOUNTER — Other Ambulatory Visit: Payer: Self-pay

## 2021-03-31 ENCOUNTER — Encounter: Payer: Self-pay | Admitting: Internal Medicine

## 2021-03-31 DIAGNOSIS — M79646 Pain in unspecified finger(s): Secondary | ICD-10-CM | POA: Insufficient documentation

## 2021-03-31 NOTE — Assessment & Plan Note (Signed)
Continues on prozac.  Doing better.  Follow.

## 2021-03-31 NOTE — Assessment & Plan Note (Signed)
Low carb diet and exercise.  Follow met b and a1c.  

## 2021-03-31 NOTE — Assessment & Plan Note (Signed)
Doing well on prozac.  Stress is better.  Follow.

## 2021-03-31 NOTE — Assessment & Plan Note (Signed)
Continue protonix.  Follow.  EGD 11/2019.  No upper symptoms reported.  

## 2021-03-31 NOTE — Assessment & Plan Note (Signed)
Right > left - thumb spica splint.  Avoid increased remote control, cell phone use, etc.  Follow.

## 2021-03-31 NOTE — Assessment & Plan Note (Signed)
Discussed compression hose (full toe).  Follow.

## 2021-03-31 NOTE — Assessment & Plan Note (Signed)
Has decreased alcohol intake.  Follow.  

## 2021-03-31 NOTE — Assessment & Plan Note (Signed)
S/p GI w/up 11/2019 - EGD and colonoscopy.  Follow cbc.  °

## 2021-05-13 ENCOUNTER — Other Ambulatory Visit: Payer: Self-pay

## 2021-05-27 ENCOUNTER — Other Ambulatory Visit: Payer: Self-pay | Admitting: Internal Medicine

## 2021-05-27 ENCOUNTER — Other Ambulatory Visit: Payer: Self-pay

## 2021-05-27 DIAGNOSIS — R739 Hyperglycemia, unspecified: Secondary | ICD-10-CM

## 2021-05-27 MED FILL — Fluoxetine HCl Cap 20 MG: ORAL | 90 days supply | Qty: 90 | Fill #1 | Status: AC

## 2021-05-28 ENCOUNTER — Other Ambulatory Visit: Payer: Self-pay

## 2021-05-28 MED FILL — Semaglutide Soln Pen-inj 0.25 or 0.5 MG/DOSE (2 MG/1.5ML): SUBCUTANEOUS | 28 days supply | Qty: 1.5 | Fill #0 | Status: AC

## 2021-06-21 ENCOUNTER — Other Ambulatory Visit: Payer: Self-pay

## 2021-06-21 ENCOUNTER — Ambulatory Visit (INDEPENDENT_AMBULATORY_CARE_PROVIDER_SITE_OTHER): Payer: No Typology Code available for payment source | Admitting: Internal Medicine

## 2021-06-21 ENCOUNTER — Encounter: Payer: Self-pay | Admitting: Internal Medicine

## 2021-06-21 VITALS — BP 120/70 | HR 80 | Temp 98.0°F | Resp 16 | Ht 63.0 in | Wt 161.0 lb

## 2021-06-21 DIAGNOSIS — F1029 Alcohol dependence with unspecified alcohol-induced disorder: Secondary | ICD-10-CM

## 2021-06-21 DIAGNOSIS — R739 Hyperglycemia, unspecified: Secondary | ICD-10-CM

## 2021-06-21 DIAGNOSIS — D649 Anemia, unspecified: Secondary | ICD-10-CM | POA: Diagnosis not present

## 2021-06-21 DIAGNOSIS — Z13220 Encounter for screening for lipoid disorders: Secondary | ICD-10-CM | POA: Diagnosis not present

## 2021-06-21 DIAGNOSIS — F32A Depression, unspecified: Secondary | ICD-10-CM

## 2021-06-21 DIAGNOSIS — K219 Gastro-esophageal reflux disease without esophagitis: Secondary | ICD-10-CM

## 2021-06-21 LAB — HEPATIC FUNCTION PANEL
ALT: 19 U/L (ref 0–53)
AST: 20 U/L (ref 0–37)
Albumin: 4.6 g/dL (ref 3.5–5.2)
Alkaline Phosphatase: 67 U/L (ref 39–117)
Bilirubin, Direct: 0.1 mg/dL (ref 0.0–0.3)
Total Bilirubin: 0.6 mg/dL (ref 0.2–1.2)
Total Protein: 7.4 g/dL (ref 6.0–8.3)

## 2021-06-21 LAB — CBC WITH DIFFERENTIAL/PLATELET
Basophils Absolute: 0 10*3/uL (ref 0.0–0.1)
Basophils Relative: 0.8 % (ref 0.0–3.0)
Eosinophils Absolute: 0.3 10*3/uL (ref 0.0–0.7)
Eosinophils Relative: 5.7 % — ABNORMAL HIGH (ref 0.0–5.0)
HCT: 42.2 % (ref 39.0–52.0)
Hemoglobin: 14.4 g/dL (ref 13.0–17.0)
Lymphocytes Relative: 37.6 % (ref 12.0–46.0)
Lymphs Abs: 2 10*3/uL (ref 0.7–4.0)
MCHC: 34.2 g/dL (ref 30.0–36.0)
MCV: 92.5 fl (ref 78.0–100.0)
Monocytes Absolute: 0.5 10*3/uL (ref 0.1–1.0)
Monocytes Relative: 9.3 % (ref 3.0–12.0)
Neutro Abs: 2.4 10*3/uL (ref 1.4–7.7)
Neutrophils Relative %: 46.6 % (ref 43.0–77.0)
Platelets: 302 10*3/uL (ref 150.0–400.0)
RBC: 4.56 Mil/uL (ref 4.22–5.81)
RDW: 13.1 % (ref 11.5–15.5)
WBC: 5.3 10*3/uL (ref 4.0–10.5)

## 2021-06-21 LAB — HEMOGLOBIN A1C: Hgb A1c MFr Bld: 6.1 % (ref 4.6–6.5)

## 2021-06-21 LAB — BASIC METABOLIC PANEL
BUN: 9 mg/dL (ref 6–23)
CO2: 32 mEq/L (ref 19–32)
Calcium: 9.3 mg/dL (ref 8.4–10.5)
Chloride: 101 mEq/L (ref 96–112)
Creatinine, Ser: 0.91 mg/dL (ref 0.40–1.50)
GFR: 105.99 mL/min (ref >60.00)
Glucose, Bld: 84 mg/dL (ref 70–99)
Potassium: 4.7 mEq/L (ref 3.5–5.1)
Sodium: 140 mEq/L (ref 135–145)

## 2021-06-21 LAB — LIPID PANEL
Cholesterol: 194 mg/dL (ref 0–200)
HDL: 77.7 mg/dL (ref >39.00)
LDL Cholesterol: 107 mg/dL — ABNORMAL HIGH (ref 0–99)
NonHDL: 115.92
Total CHOL/HDL Ratio: 2
Triglycerides: 47 mg/dL (ref 0.0–149.0)
VLDL: 9.4 mg/dL (ref 0.0–40.0)

## 2021-06-21 NOTE — Progress Notes (Addendum)
Patient ID: Austin [name], male   DOB: Oct 04, 1980, 40 y.o.   MRN: [id number]   Subjective:    Patient ID: Austin [name], male    DOB: 03-Aug-1980, 41 y.o.   MRN: [id number]  This visit occurred during the SARS-CoV-2 public health emergency.  Safety protocols were in place, including screening questions prior to the visit, additional usage of staff PPE, and extensive cleaning of exam room while observing appropriate contact time as indicated for disinfecting solutions.   Patient here for a scheduled follow up.    HPI Here to follow up regarding increased stress, GERD and hypertension.  He reports he is doing relatively well.  Trying to exercise some, but has not been exercising regularly.  No chest pain or sob reported.  Occasionally will notice some abdominal discomfort if he has not eaten, but this resolves when he eats. No other abdominal pain.  Bowels moving.  No blood.  Handling stress.  Stress is better.  Job stress - better.  Drinking 4-5 beers/day. Discussed.  Has lost weight.  Eating.  Adjusted diet.     Past Medical History:  Diagnosis Date   Alcohol abuse    history of   Allergy    Anxiety    Depression    GERD (gastroesophageal reflux disease)    Heart murmur    Hypertension    Ulcer    history of bleeding ulcer requiring blood transfusions   Past Surgical History:  Procedure Laterality Date   COLONOSCOPY WITH PROPOFOL N/A 12/12/2019   Procedure: COLONOSCOPY WITH PROPOFOL;  Surgeon: Toledo, Benay Pike, MD;  Location: [facility];  Service: Gastroenterology;  Laterality: N/A;   ESOPHAGOGASTRODUODENOSCOPY (EGD) WITH PROPOFOL N/A 12/12/2019   Procedure: ESOPHAGOGASTRODUODENOSCOPY (EGD) WITH PROPOFOL;  Surgeon: Toledo, Benay Pike, MD;  Location: [facility];  Service: Gastroenterology;  Laterality: N/A;   KNEE SURGERY     Left knee - ACL   TONSILLECTOMY AND ADENOIDECTOMY  1992   Family History  Problem Relation Age of Onset   Alcohol abuse Father    Cancer Father         prostate cancer   Arthritis Sister    Cancer Maternal Grandmother        breast cancer   Stroke Maternal Grandmother    Diabetes Maternal Grandmother    Hypertension Maternal Grandmother    Social History   Socioeconomic History   Marital status: Married    Spouse name: Not on file   Number of children: Not on file   Years of education: Not on file   Highest education level: Not on file  Occupational History   Not on file  Tobacco Use   Smoking status: Former    Packs/day: 0.50    Types: Cigarettes   Smokeless tobacco: Never   Tobacco comments:    Quit smoking recently  Vaping Use   Vaping Use: Never used  Substance and Sexual Activity   Alcohol use: Yes    Alcohol/week: 2.0 standard drinks    Types: 2 Cans of beer per week   Drug use: Yes    Types: Marijuana    Comment: marijuana   Sexual activity: Not on file  Other Topics Concern   Not on file  Social History Narrative   Not on file   Social Determinants of Health   Financial Resource Strain: Low Risk    Difficulty of Paying Living Expenses: Not hard at all  Food Insecurity: Not on file  Transportation Needs: Not on file  Physical Activity: Not on file  Stress: Not on file  Social Connections: Not on file     Review of Systems  Constitutional:  Negative for appetite change and unexpected weight change.  HENT:  Negative for congestion and sinus pressure.   Respiratory:  Negative for cough, chest tightness and shortness of breath.   Cardiovascular:  Negative for chest pain, palpitations and leg swelling.  Gastrointestinal:  Negative for abdominal pain, diarrhea, nausea and vomiting.  Genitourinary:  Negative for difficulty urinating and dysuria.  Musculoskeletal:  Negative for joint swelling and myalgias.  Skin:  Negative for color change and rash.  Neurological:  Negative for dizziness, light-headedness and headaches.  Psychiatric/Behavioral:  Negative for agitation and dysphoric mood.        Objective:     BP 120/70    Pulse 80    Temp 98 F (36.7 C)    Resp 16    Ht 5' 3"  (1.6 m)    Wt 161 lb (73 kg)    SpO2 98%    BMI 28.52 kg/m  Wt Readings from Last 3 Encounters:  06/21/21 161 lb (73 kg)  03/25/21 170 lb (77.1 kg)  01/24/21 183 lb (83 kg)    Physical Exam Constitutional:      General: He is not in acute distress.    Appearance: Normal appearance. He is well-developed.  HENT:     Head: Normocephalic and atraumatic.     Right Ear: External ear normal.     Left Ear: External ear normal.  Eyes:     General: No scleral icterus.       Right eye: No discharge.        Left eye: No discharge.  Cardiovascular:     Rate and Rhythm: Normal rate and regular rhythm.  Pulmonary:     Effort: Pulmonary effort is normal. No respiratory distress.     Breath sounds: Normal breath sounds.  Abdominal:     General: Bowel sounds are normal.     Palpations: Abdomen is soft.     Tenderness: There is no abdominal tenderness.  Musculoskeletal:        General: No swelling or tenderness.     Cervical back: Neck supple. No tenderness.  Lymphadenopathy:     Cervical: No cervical adenopathy.  Skin:    Findings: No erythema or rash.  Neurological:     Mental Status: He is alert.  Psychiatric:        Mood and Affect: Mood normal.        Behavior: Behavior normal.     Outpatient Encounter Medications as of 06/21/2021  Medication Sig   acetaminophen (TYLENOL) 500 MG tablet Take 500 mg by mouth 2 (two) times daily as needed.   Ascorbic Acid (VITAMIN C) 100 MG tablet Take 100 mg by mouth daily.   cetirizine (ZYRTEC) 10 MG tablet Take 10 mg by mouth daily.   dicyclomine (BENTYL) 10 MG capsule TAKE 1 CAPSULE BY MOUTH 4 TIMES DAILY BEFORE MEALS AND NIGHTLY   FLUoxetine (PROZAC) 20 MG capsule TAKE 1 CAPSULE BY MOUTH DAILY.   pantoprazole (PROTONIX) 40 MG tablet TAKE 1 TABLET BY MOUTH TWICE DAILY   Semaglutide,0.25 or 0.5MG/DOS, (OZEMPIC, 0.25 OR 0.5 MG/DOSE,) 2 MG/1.5ML SOPN Inject  0.25 mg weekly for 4 weeks, then increase to 0.5 mg weekly   [DISCONTINUED] doxycycline (VIBRA-TABS) 100 MG tablet Take 1 tablet (100 mg total) by mouth 2 (two) times daily.   No facility-administered encounter medications on file as  of 06/21/2021.     Lab Results  Component Value Date   WBC 5.3 06/21/2021   HGB 14.4 06/21/2021   HCT 42.2 06/21/2021   PLT 302.0 06/21/2021   GLUCOSE 84 06/21/2021   CHOL 194 06/21/2021   TRIG 47.0 06/21/2021   HDL 77.70 06/21/2021   LDLCALC 107 (H) 06/21/2021   ALT 19 06/21/2021   AST 20 06/21/2021   NA 140 06/21/2021   K 4.7 06/21/2021   CL 101 06/21/2021   CREATININE 0.91 06/21/2021   BUN 9 06/21/2021   CO2 32 06/21/2021   TSH 0.63 09/10/2020   HGBA1C 6.1 06/21/2021       Assessment & Plan:   Problem List Items Addressed This Visit     Alcohol dependence ([place])    Has decreased overall amount of alcohol intake.  Still drinking as outlined.  Discussed counseling, AA, etc.  He declines at this time.  Discussed need to decrease alcohol intake.  Follow.       Anemia    S/p GI w/up 11/2019 - EGD and colonoscopy.  Follow cbc.       Relevant Orders   CBC with Differential/Platelet (Completed)   Basic metabolic panel (Completed)   GERD (gastroesophageal reflux disease)    Continue protonix.  Follow.  EGD 11/2019.  No upper symptoms reported.       Hyperglycemia - Primary    Low carb diet and exercise.  Follow met b and a1c.       Relevant Orders   Hemoglobin A1c (Completed)   Hepatic function panel (Completed)   Mild depression    Continues on prozac.  Appears to be doing better.  Follow.        Other Visit Diagnoses     Screening cholesterol level       Relevant Orders   Lipid panel (Completed)        Einar Pheasant, MD

## 2021-06-21 NOTE — Assessment & Plan Note (Signed)
Continue protonix.  Follow.  EGD 11/2019.  No upper symptoms reported.  

## 2021-06-24 ENCOUNTER — Encounter: Payer: Self-pay | Admitting: Internal Medicine

## 2021-06-24 NOTE — Assessment & Plan Note (Signed)
Low carb diet and exercise.  Follow met b and a1c.

## 2021-06-24 NOTE — Assessment & Plan Note (Signed)
S/p GI w/up 11/2019 - EGD and colonoscopy.  Follow cbc.

## 2021-06-24 NOTE — Assessment & Plan Note (Signed)
Continues on prozac.  Appears to be doing better.  Follow.

## 2021-06-24 NOTE — Assessment & Plan Note (Signed)
Has decreased overall amount of alcohol intake.  Still drinking as outlined.  Discussed counseling, AA, etc.  He declines at this time.  Discussed need to decrease alcohol intake.  Follow.

## 2021-07-29 ENCOUNTER — Other Ambulatory Visit: Payer: Self-pay

## 2021-07-29 ENCOUNTER — Other Ambulatory Visit: Payer: Self-pay | Admitting: Internal Medicine

## 2021-07-29 DIAGNOSIS — K2100 Gastro-esophageal reflux disease with esophagitis, without bleeding: Secondary | ICD-10-CM

## 2021-07-29 MED FILL — Semaglutide Soln Pen-inj 0.25 or 0.5 MG/DOSE (2 MG/1.5ML): SUBCUTANEOUS | 28 days supply | Qty: 1.5 | Fill #1 | Status: AC

## 2021-07-30 ENCOUNTER — Other Ambulatory Visit: Payer: Self-pay

## 2021-07-30 MED ORDER — PANTOPRAZOLE SODIUM 40 MG PO TBEC
DELAYED_RELEASE_TABLET | Freq: Two times a day (BID) | ORAL | 1 refills | Status: DC
  Filled 2021-07-30 – 2021-10-22 (×4): qty 180, 90d supply, fill #0
  Filled 2022-02-26: qty 180, 90d supply, fill #1

## 2021-07-30 MED ORDER — FLUOXETINE HCL 20 MG PO CAPS
ORAL_CAPSULE | Freq: Every day | ORAL | 1 refills | Status: DC
  Filled 2021-07-30 (×3): qty 90, 90d supply, fill #0

## 2021-09-03 ENCOUNTER — Encounter: Payer: Self-pay | Admitting: Internal Medicine

## 2021-09-03 ENCOUNTER — Ambulatory Visit: Payer: Self-pay | Admitting: Pharmacist

## 2021-09-03 NOTE — Chronic Care Management (AMB) (Signed)
?  Chronic Care Management  ? ?Note ? ?09/03/2021 ?Name: Austin [name] MRN: [id number] DOB: 05/23/1981 ? ? ? ?Closing pharmacy CCM case at this time. Patient has clinic contact information for future questions or concerns.  ? ?Catie Feliz Beam, PharmD, [place], CPP ?Clinical Pharmacist ?Nature conservation officer at [employer] ?[phone] ? ?

## 2021-09-10 ENCOUNTER — Telehealth (INDEPENDENT_AMBULATORY_CARE_PROVIDER_SITE_OTHER): Payer: No Typology Code available for payment source | Admitting: Internal Medicine

## 2021-09-10 ENCOUNTER — Other Ambulatory Visit: Payer: Self-pay

## 2021-09-10 ENCOUNTER — Telehealth: Payer: Self-pay | Admitting: Internal Medicine

## 2021-09-10 DIAGNOSIS — R739 Hyperglycemia, unspecified: Secondary | ICD-10-CM | POA: Diagnosis not present

## 2021-09-10 DIAGNOSIS — F32A Depression, unspecified: Secondary | ICD-10-CM

## 2021-09-10 DIAGNOSIS — K219 Gastro-esophageal reflux disease without esophagitis: Secondary | ICD-10-CM

## 2021-09-10 DIAGNOSIS — Z13220 Encounter for screening for lipoid disorders: Secondary | ICD-10-CM | POA: Diagnosis not present

## 2021-09-10 DIAGNOSIS — D649 Anemia, unspecified: Secondary | ICD-10-CM

## 2021-09-10 MED ORDER — FLUOXETINE HCL 10 MG PO TABS: 10.0000 mg | ORAL_TABLET | Freq: Every day | ORAL | 1 refills | Status: DC

## 2021-09-10 MED ORDER — FLUOXETINE HCL 10 MG PO TABS
10.0000 mg | ORAL_TABLET | Freq: Every day | ORAL | 1 refills | Status: DC
  Filled 2021-09-10: qty 30, 30d supply, fill #0
  Filled 2021-10-22: qty 30, 30d supply, fill #1

## 2021-09-10 NOTE — Telephone Encounter (Signed)
Pt called in stating that his medication was sent to the wrong pharmacy. It was supposed to have been sent to [place] instead ?

## 2021-09-10 NOTE — Telephone Encounter (Signed)
Medication sent.

## 2021-09-10 NOTE — Progress Notes (Signed)
Patient ID: Austin [name], male   DOB: Dec 20, 1980, 41 y.o.   MRN: [id number] ? ? ?Virtual Visit via video Note ? ?This visit type was conducted due to national recommendations for restrictions regarding the COVID-19 pandemic (e.g. social distancing).  This format is felt to be most appropriate for this patient at this time.  All issues noted in this document were discussed and addressed.  No physical exam was performed (except for noted visual exam findings with Video Visits).  ? ?I connected with Austin [name] by a video enabled telemedicine application and verified that I am speaking with the correct person using two identifiers. ?Location patient: home ?Location provider: work ?Persons participating in the virtual visit: patient, provider ? ?The limitations, risks, security and privacy concerns of performing an evaluation and management service by video and the availability of in person appointments have been discussed.  It has also been discussed with the patient that there may be a patient responsible charge related to this service. The patient expressed understanding and agreed to proceed. ? ? ?Reason for visit: work in appt.  ? ?HPI: ?Reports he is doing relatively well.  Still with increased stress - family stress.  Discussed.  Uses marijuana to help relax.  On prozac.  Wants to taper off.  Discussed tapering medication and tapering schedule given.  Call with update over next 2 weeks.  No chest pain or sob reported.  No abdominal pain or bowel change reported.  No acid reflux.  Has been on ozempic.  Has lost weight.  BMI 28 last check.  Insurance no longer covering ozempic.  Discussed wegovy.   ? ? ?ROS: See pertinent positives and negatives per HPI. ? ?Past Medical History:  ?Diagnosis Date  ? Alcohol abuse   ? history of  ? Allergy   ? Anxiety   ? Depression   ? GERD (gastroesophageal reflux disease)   ? Heart murmur   ? Hypertension   ? Ulcer   ? history of bleeding ulcer requiring blood transfusions  ? ? ?Past  Surgical History:  ?Procedure Laterality Date  ? COLONOSCOPY WITH PROPOFOL N/A 12/12/2019  ? Procedure: COLONOSCOPY WITH PROPOFOL;  Surgeon: Toledo, Benay Pike, MD;  Location: [facility];  Service: Gastroenterology;  Laterality: N/A;  ? ESOPHAGOGASTRODUODENOSCOPY (EGD) WITH PROPOFOL N/A 12/12/2019  ? Procedure: ESOPHAGOGASTRODUODENOSCOPY (EGD) WITH PROPOFOL;  Surgeon: Toledo, Benay Pike, MD;  Location: [facility];  Service: Gastroenterology;  Laterality: N/A;  ? KNEE SURGERY    ? Left knee - ACL  ? TONSILLECTOMY AND ADENOIDECTOMY  1992  ? ? ?Family History  ?Problem Relation Age of Onset  ? Alcohol abuse Father   ? Cancer Father   ?     prostate cancer  ? Arthritis Sister   ? Cancer Maternal Grandmother   ?     breast cancer  ? Stroke Maternal Grandmother   ? Diabetes Maternal Grandmother   ? Hypertension Maternal Grandmother   ? ? ?SOCIAL HX: reviewed.  ? ? ?Current Outpatient Medications:  ?  acetaminophen (TYLENOL) 500 MG tablet, Take 500 mg by mouth 2 (two) times daily as needed., Disp: , Rfl:  ?  Ascorbic Acid (VITAMIN C) 100 MG tablet, Take 100 mg by mouth daily., Disp: , Rfl:  ?  cetirizine (ZYRTEC) 10 MG tablet, Take 10 mg by mouth daily., Disp: , Rfl:  ?  dicyclomine (BENTYL) 10 MG capsule, TAKE 1 CAPSULE BY MOUTH 4 TIMES DAILY BEFORE MEALS AND NIGHTLY, Disp: 120 capsule, Rfl: 11 ?  FLUoxetine (PROZAC) 10 MG tablet, Take 1 tablet (10 mg total) by mouth daily., Disp: 30 tablet, Rfl: 1 ?  pantoprazole (PROTONIX) 40 MG tablet, TAKE 1 TABLET BY MOUTH TWICE DAILY, Disp: 180 tablet, Rfl: 1 ?  Semaglutide,0.25 or 0.5MG/DOS, (OZEMPIC, 0.25 OR 0.5 MG/DOSE,) 2 MG/1.5ML SOPN, Inject 0.25 mg weekly for 4 weeks, then increase to 0.5 mg weekly, Disp: 1.5 mL, Rfl: 2 ? ?EXAM: ? ?GENERAL: alert, oriented, appears well and in no acute distress ? ?HEENT: atraumatic, conjunttiva clear, no obvious abnormalities on inspection of external nose and ears ? ?NECK: normal movements of the head and neck ? ?LUNGS: on inspection no  signs of respiratory distress, breathing rate appears normal, no obvious gross SOB, gasping or wheezing ? ?CV: no obvious cyanosis ? ? ?PSYCH/NEURO: pleasant and cooperative, no obvious depression or anxiety, speech and thought processing grossly intact ? ?ASSESSMENT AND PLAN: ? ?Discussed the following assessment and plan: ? ?Problem List Items Addressed This Visit   ? ? Anemia  ? Relevant Orders  ? Basic metabolic panel  ? TSH  ? GERD (gastroesophageal reflux disease)  ?  Continue protonix.  Follow.  EGD 11/2019.  No upper symptoms reported.  ?  ?  ? Hyperglycemia - Primary  ?  Low carb diet and exercise.  Follow met b and a1c. Has been on ozempic.  Has lost weight.  Insurance not covering now.  Discussed wegovy.  Check with insurance.  ?  ?  ? Relevant Orders  ? Hepatic function panel  ? Hemoglobin A1c  ? Mild depression  ?  On prozac.  Job going well.  Decreased stress with his job.  Still with family stress.  Discussed.  Wants to taper prozac.  Discussed tapering dose.  Will decrease to 39m q day.  Call with update over the next 2 weeks.  Smokes marijuana to help relax.  Had questions about continuing.   ?  ?  ? ?Other Visit Diagnoses   ? ? Screening cholesterol level      ? Relevant Orders  ? Lipid panel  ? ?  ? ? ?Return if symptoms worsen or fail to improve, for keep scheduled. ?  ?I discussed the assessment and treatment plan with the patient. The patient was provided an opportunity to ask questions and all were answered. The patient agreed with the plan and demonstrated an understanding of the instructions. ?  ?The patient was advised to call back or seek an in-person evaluation if the symptoms worsen or if the condition fails to improve as anticipated. ? ? ? ?CEinar Pheasant MD   ?

## 2021-09-15 ENCOUNTER — Encounter: Payer: Self-pay | Admitting: Internal Medicine

## 2021-09-15 ENCOUNTER — Telehealth: Payer: Self-pay | Admitting: Internal Medicine

## 2021-09-15 NOTE — Assessment & Plan Note (Signed)
On prozac.  Job going well.  Decreased stress with his job.  Still with family stress.  Discussed.  Wants to taper prozac.  Discussed tapering dose.  Will decrease to 10mg  q day.  Call with update over the next 2 weeks.  Smokes marijuana to help relax.  Had questions about continuing.   ?

## 2021-09-15 NOTE — Assessment & Plan Note (Signed)
Continue protonix.  Follow.  EGD 11/2019.  No upper symptoms reported.  

## 2021-09-15 NOTE — Assessment & Plan Note (Signed)
Low carb diet and exercise.  Follow met b and a1c. Has been on ozempic.  Has lost weight.  Insurance not covering now.  Discussed wegovy.  Check with insurance.  ?

## 2021-09-15 NOTE — Telephone Encounter (Signed)
Insurance no longer covering ozempic.  Discussed wegovy with him.  What are his options and cost?  Not diabetic. Is pre diabetic.  BMI 28 after ozempic - weight loss.   ?

## 2021-09-16 NOTE — Telephone Encounter (Signed)
Yes.  We discussed wegovy.  ?

## 2021-09-16 NOTE — Telephone Encounter (Signed)
Patient mentioned wegovy. Not sure if insurance will cover but are you ok with trying?  ?

## 2021-09-17 ENCOUNTER — Other Ambulatory Visit: Payer: Self-pay

## 2021-09-17 NOTE — Telephone Encounter (Signed)
Ok for starting dose.  ?

## 2021-09-17 NOTE — Telephone Encounter (Signed)
See my chart message

## 2021-09-18 ENCOUNTER — Other Ambulatory Visit: Payer: Self-pay

## 2021-09-18 MED ORDER — WEGOVY 0.25 MG/0.5ML ~~LOC~~ SOAJ
0.2500 mg | SUBCUTANEOUS | 2 refills | Status: DC
Start: 2021-09-18 — End: 2021-11-02
  Filled 2021-09-18 – 2021-10-09 (×5): qty 2, 28d supply, fill #0

## 2021-09-23 ENCOUNTER — Other Ambulatory Visit: Payer: Self-pay

## 2021-09-30 ENCOUNTER — Other Ambulatory Visit: Payer: Self-pay

## 2021-10-02 ENCOUNTER — Telehealth: Payer: Self-pay

## 2021-10-02 ENCOUNTER — Other Ambulatory Visit: Payer: Self-pay

## 2021-10-02 NOTE — Telephone Encounter (Signed)
Please let him know that you are working on the [place].  I spoke to the pharmacist and she informed me that the ozempic will not cover nor aurthorize rx.  Hopefully can get wegovy approved soon.   ?

## 2021-10-03 ENCOUNTER — Other Ambulatory Visit: Payer: Self-pay

## 2021-10-07 NOTE — Telephone Encounter (Signed)
Pt called in stating someone called him regarding his prescription. Pt stated he has not received his prescription yet- wegovy ?

## 2021-10-08 ENCOUNTER — Other Ambulatory Visit: Payer: Self-pay

## 2021-10-08 ENCOUNTER — Encounter: Payer: Self-pay | Admitting: Internal Medicine

## 2021-10-08 MED ORDER — OZEMPIC (0.25 OR 0.5 MG/DOSE) 2 MG/1.5ML ~~LOC~~ SOPN
0.5000 mg | PEN_INJECTOR | SUBCUTANEOUS | 1 refills | Status: DC
  Filled 2021-10-08: qty 1.5, 28d supply, fill #0

## 2021-10-09 ENCOUNTER — Other Ambulatory Visit: Payer: Self-pay

## 2021-10-10 ENCOUNTER — Telehealth: Payer: Self-pay

## 2021-10-10 NOTE — Telephone Encounter (Signed)
Austin [name] ([id number]) ?Rx #: [id number] ?Wegovy 0.25MG /0.5ML auto-injectors ?  ?Form ?MedImpact ePA Form 2017 NCPDP ?

## 2021-10-14 ENCOUNTER — Other Ambulatory Visit: Payer: Self-pay

## 2021-10-15 ENCOUNTER — Other Ambulatory Visit: Payer: Self-pay

## 2021-10-18 ENCOUNTER — Other Ambulatory Visit: Payer: Self-pay

## 2021-10-18 ENCOUNTER — Other Ambulatory Visit: Payer: No Typology Code available for payment source

## 2021-10-21 NOTE — Telephone Encounter (Signed)
denied °

## 2021-10-22 ENCOUNTER — Ambulatory Visit (INDEPENDENT_AMBULATORY_CARE_PROVIDER_SITE_OTHER): Payer: No Typology Code available for payment source | Admitting: Internal Medicine

## 2021-10-22 ENCOUNTER — Other Ambulatory Visit: Payer: Self-pay

## 2021-10-22 VITALS — BP 138/70 | HR 65 | Temp 98.1°F | Resp 15 | Ht 63.0 in | Wt 167.0 lb

## 2021-10-22 DIAGNOSIS — Z713 Dietary counseling and surveillance: Secondary | ICD-10-CM

## 2021-10-22 DIAGNOSIS — Z0000 Encounter for general adult medical examination without abnormal findings: Secondary | ICD-10-CM

## 2021-10-22 DIAGNOSIS — Z125 Encounter for screening for malignant neoplasm of prostate: Secondary | ICD-10-CM | POA: Diagnosis not present

## 2021-10-22 DIAGNOSIS — K219 Gastro-esophageal reflux disease without esophagitis: Secondary | ICD-10-CM

## 2021-10-22 DIAGNOSIS — R739 Hyperglycemia, unspecified: Secondary | ICD-10-CM | POA: Diagnosis not present

## 2021-10-22 DIAGNOSIS — D649 Anemia, unspecified: Secondary | ICD-10-CM | POA: Diagnosis not present

## 2021-10-22 DIAGNOSIS — Z13220 Encounter for screening for lipoid disorders: Secondary | ICD-10-CM | POA: Diagnosis not present

## 2021-10-22 DIAGNOSIS — F419 Anxiety disorder, unspecified: Secondary | ICD-10-CM

## 2021-10-22 DIAGNOSIS — F1029 Alcohol dependence with unspecified alcohol-induced disorder: Secondary | ICD-10-CM

## 2021-10-22 LAB — HEPATIC FUNCTION PANEL
ALT: 24 U/L (ref 0–53)
AST: 21 U/L (ref 0–37)
Albumin: 4.5 g/dL (ref 3.5–5.2)
Alkaline Phosphatase: 65 U/L (ref 39–117)
Bilirubin, Direct: 0.1 mg/dL (ref 0.0–0.3)
Total Bilirubin: 0.7 mg/dL (ref 0.2–1.2)
Total Protein: 7.3 g/dL (ref 6.0–8.3)

## 2021-10-22 LAB — LIPID PANEL
Cholesterol: 196 mg/dL (ref 0–200)
HDL: 81.3 mg/dL (ref >39.00)
LDL Cholesterol: 101 mg/dL — ABNORMAL HIGH (ref 0–99)
NonHDL: 115.09
Total CHOL/HDL Ratio: 2
Triglycerides: 69 mg/dL (ref 0.0–149.0)
VLDL: 13.8 mg/dL (ref 0.0–40.0)

## 2021-10-22 LAB — BASIC METABOLIC PANEL
BUN: 9 mg/dL (ref 6–23)
CO2: 31 mEq/L (ref 19–32)
Calcium: 9.6 mg/dL (ref 8.4–10.5)
Chloride: 101 mEq/L (ref 96–112)
Creatinine, Ser: 0.8 mg/dL (ref 0.40–1.50)
GFR: 111.03 mL/min (ref >60.00)
Glucose, Bld: 85 mg/dL (ref 70–99)
Potassium: 4.5 mEq/L (ref 3.5–5.1)
Sodium: 139 mEq/L (ref 135–145)

## 2021-10-22 LAB — PSA: PSA: 0.24 ng/mL (ref 0.10–4.00)

## 2021-10-22 LAB — HEMOGLOBIN A1C: Hgb A1c MFr Bld: 5.8 % (ref 4.6–6.5)

## 2021-10-22 LAB — TSH: TSH: 0.84 u[IU]/mL (ref 0.35–5.50)

## 2021-10-22 NOTE — Assessment & Plan Note (Addendum)
Physical today 10/22/21.  Colonoscopy 11/2019 - normal (Dr Norma Fredrickson). Check psa.  ?

## 2021-10-22 NOTE — Progress Notes (Signed)
Patient ID: Austin [name], male   DOB: 07/07/1980, 41 y.o.   MRN: [id number] ? ? ?Subjective:  ? ? Patient ID: Austin [name], male    DOB: May 05, 1981, 41 y.o.   MRN: [id number] ? ?Patient here for his physical exam.  ? ?Chief Complaint  ?Patient presents with  ? Annual Exam  ?  CPE  ? .  ? ?HPI ?Doing relatively well.  Not exercising.  No chest pain or sob reported.  Discussed diet and exercise.  No acid reflux reported.  No abdominal pain or bowel change reported.  Doing ok on current prozac dosing.  Was on ozempic.  Off now - insurance denied.  Had discussed wegovy. Getting new insurance.  Will see if covered.  Discussed decrease alcohol intake.  ? ? ?Past Medical History:  ?Diagnosis Date  ? Alcohol abuse   ? history of  ? Allergy   ? Anxiety   ? Depression   ? GERD (gastroesophageal reflux disease)   ? Heart murmur   ? Hypertension   ? Ulcer   ? history of bleeding ulcer requiring blood transfusions  ? ?Past Surgical History:  ?Procedure Laterality Date  ? COLONOSCOPY WITH PROPOFOL N/A 12/12/2019  ? Procedure: COLONOSCOPY WITH PROPOFOL;  Surgeon: Toledo, Benay Pike, MD;  Location: [facility];  Service: Gastroenterology;  Laterality: N/A;  ? ESOPHAGOGASTRODUODENOSCOPY (EGD) WITH PROPOFOL N/A 12/12/2019  ? Procedure: ESOPHAGOGASTRODUODENOSCOPY (EGD) WITH PROPOFOL;  Surgeon: Toledo, Benay Pike, MD;  Location: [facility] END[facility]COPY;  Service: Gastroenterology;  Laterality: N/A;  ? KNEE SURGERY    ? Left knee - ACL  ? TONSILLECTOMY AND ADENOIDECTOMY  1992  ? ?Family History  ?Problem Relation Age of Onset  ? Alcohol abuse Father   ? Cancer Father   ?     prostate cancer  ? Arthritis Sister   ? Cancer Maternal Grandmother   ?     breast cancer  ? Stroke Maternal Grandmother   ? Diabetes Maternal Grandmother   ? Hypertension Maternal Grandmother   ? ?Social History  ? ?Socioeconomic History  ? Marital status: Married  ?  Spouse name: Not on file  ? Number of children: Not on file  ? Years of education: Not on file  ? Highest  education level: Not on file  ?Occupational History  ? Not on file  ?Tobacco Use  ? Smoking status: Former  ?  Packs/day: 0.50  ?  Types: Cigarettes  ? Smokeless tobacco: Never  ? Tobacco comments:  ?  Quit smoking recently  ?Vaping Use  ? Vaping Use: Never used  ?Substance and Sexual Activity  ? Alcohol use: Yes  ?  Alcohol/week: 2.0 standard drinks  ?  Types: 2 Cans of beer per week  ? Drug use: Yes  ?  Types: Marijuana  ?  Comment: marijuana  ? Sexual activity: Not on file  ?Other Topics Concern  ? Not on file  ?Social History Narrative  ? Not on file  ? ?Social Determinants of Health  ? ?Financial Resource Strain: Low Risk   ? Difficulty of Paying Living Expenses: Not hard at all  ?Food Insecurity: Not on file  ?Transportation Needs: Not on file  ?Physical Activity: Not on file  ?Stress: Not on file  ?Social Connections: Not on file  ? ? ? ?Review of Systems  ?Constitutional:  Negative for appetite change and unexpected weight change.  ?HENT:  Negative for congestion, sinus pressure and sore throat.   ?Eyes:  Negative for pain and  visual disturbance.  ?Respiratory:  Negative for cough, chest tightness and shortness of breath.   ?Cardiovascular:  Negative for chest pain, palpitations and leg swelling.  ?Gastrointestinal:  Negative for abdominal pain, diarrhea, nausea and vomiting.  ?Genitourinary:  Negative for difficulty urinating and dysuria.  ?Musculoskeletal:  Negative for joint swelling and myalgias.  ?Skin:  Negative for color change and rash.  ?Neurological:  Negative for dizziness, light-headedness and headaches.  ?Hematological:  Negative for adenopathy. Does not bruise/bleed easily.  ?Psychiatric/Behavioral:  Negative for agitation and dysphoric mood.   ? ?   ?Objective:  ?  ? ?BP 138/70 (BP Location: Left Arm, Patient Position: Sitting, Cuff Size: Large)   Pulse 65   Temp 98.1 ?F (36.7 ?C) (Temporal)   Resp 15   Ht 5\' 3"  (1.6 m)   Wt 167 lb (75.8 kg)   SpO2 100%   BMI 29.58 kg/m?  ?Wt Readings  from Last 3 Encounters:  ?10/22/21 167 lb (75.8 kg)  ?06/21/21 161 lb (73 kg)  ?03/25/21 170 lb (77.1 kg)  ? ? ?Physical Exam ?Constitutional:   ?   General: He is not in acute distress. ?   Appearance: Normal appearance. He is well-developed.  ?HENT:  ?   Head: Normocephalic and atraumatic.  ?   Right Ear: External ear normal.  ?   Left Ear: External ear normal.  ?Eyes:  ?   General: No scleral icterus.    ?   Right eye: No discharge.     ?   Left eye: No discharge.  ?   Conjunctiva/sclera: Conjunctivae normal.  ?Neck:  ?   Thyroid: No thyromegaly.  ?Cardiovascular:  ?   Rate and Rhythm: Normal rate and regular rhythm.  ?Pulmonary:  ?   Effort: No respiratory distress.  ?   Breath sounds: Normal breath sounds. No wheezing.  ?Abdominal:  ?   General: Bowel sounds are normal.  ?   Palpations: Abdomen is soft.  ?   Tenderness: There is no abdominal tenderness.  ?Musculoskeletal:     ?   General: No swelling or tenderness.  ?   Cervical back: Neck supple. No tenderness.  ?Lymphadenopathy:  ?   Cervical: No cervical adenopathy.  ?Skin: ?   Findings: No erythema or rash.  ?Neurological:  ?   Mental Status: He is alert and oriented to person, place, and time.  ?Psychiatric:     ?   Mood and Affect: Mood normal.     ?   Behavior: Behavior normal.  ? ? ? ?Outpatient Encounter Medications as of 10/22/2021  ?Medication Sig  ? acetaminophen (TYLENOL) 500 MG tablet Take 500 mg by mouth 2 (two) times daily as needed.  ? Ascorbic Acid (VITAMIN C) 100 MG tablet Take 100 mg by mouth daily.  ? cetirizine (ZYRTEC) 10 MG tablet Take 10 mg by mouth daily.  ? dicyclomine (BENTYL) 10 MG capsule TAKE 1 CAPSULE BY MOUTH 4 TIMES DAILY BEFORE MEALS AND NIGHTLY  ? FLUoxetine (PROZAC) 10 MG tablet Take 1 tablet (10 mg total) by mouth daily.  ? pantoprazole (PROTONIX) 40 MG tablet TAKE 1 TABLET BY MOUTH TWICE DAILY  ? [DISCONTINUED] Semaglutide,0.25 or 0.5MG /DOS, (OZEMPIC, 0.25 OR 0.5 MG/DOSE,) 2 MG/1.5ML SOPN Inject 0.5 mg into the skin once a  week. Inject 0.5 mg weekly (Patient not taking: Reported on 10/21/2021)  ? [DISCONTINUED] Semaglutide-Weight Management (WEGOVY) 0.25 MG/0.5ML SOAJ Inject 0.25 mg into the skin once a week. (Patient not taking: Reported on 10/21/2021)  ? ?  No facility-administered encounter medications on file as of 10/22/2021.  ?  ? ?Lab Results  ?Component Value Date  ? WBC 5.3 06/21/2021  ? HGB 14.4 06/21/2021  ? HCT 42.2 06/21/2021  ? PLT 302.0 06/21/2021  ? GLUCOSE 85 10/22/2021  ? CHOL 196 10/22/2021  ? TRIG 69.0 10/22/2021  ? HDL 81.30 10/22/2021  ? LDLCALC 101 (H) 10/22/2021  ? ALT 24 10/22/2021  ? AST 21 10/22/2021  ? NA 139 10/22/2021  ? K 4.5 10/22/2021  ? CL 101 10/22/2021  ? CREATININE 0.80 10/22/2021  ? BUN 9 10/22/2021  ? CO2 31 10/22/2021  ? TSH 0.84 10/22/2021  ? PSA 0.24 10/22/2021  ? HGBA1C 5.8 10/22/2021  ? ? ?   ?Assessment & Plan:  ? ?Problem List Items Addressed This Visit   ? ? Alcohol dependence ([place])  ?  Still drinking.  Have discussed counseling, AA, etc.  He has declined.   Discussed need to decrease alcohol intake.  Follow.  ? ?  ?  ? Anemia  ?  S/p GI w/up 11/2019 - EGD and colonoscopy.  Follow cbc.  ? ?  ?  ? Anxiety  ?  On prozac.  Stable. Follow.  ? ?  ?  ? GERD (gastroesophageal reflux disease)  ?  Continue protonix.  Follow.  EGD 11/2019.  No upper symptoms reported.  ? ?  ?  ? Health care maintenance  ?  Physical today 10/22/21.  Colonoscopy 11/2019 - normal (Dr Alice Reichert). Check psa.  ? ?  ?  ? Hyperglycemia  ?  Low carb diet and exercise.  Follow met b and a1c.  ? ?  ?  ? Weight loss counseling, encounter for  ?  Have discussed wegovy.  Tolerated and did well with ozempic.  Insurance not covering.  Now insurance not covering wegovy.  He is changing insurance soon.  Will contact me once insurance change and will see if wegovy covered.  Follow. Low carb diet and exercise.  ? ?  ?  ? ?Other Visit Diagnoses   ? ? Routine general medical examination at a health care facility    -  Primary  ? Screening  cholesterol level      ? Screening for prostate cancer      ? Relevant Orders  ? PSA (Completed)  ? ?  ? ? ? ?Einar Pheasant, MD  ?

## 2021-10-28 ENCOUNTER — Telehealth: Payer: Self-pay

## 2021-10-28 NOTE — Telephone Encounter (Signed)
LM for pt to cb re : ? ? ? I reviewed your recent lab results and your cholesterol levels are ok.  Your overall sugar control has improved.  (A1c 5.8).  Your prostate test is within normal limits.  Your thyroid test, kidney function tests and liver function tests are within normal limits as well.  Let us know if you need anything.   ?  ?Dr Lorin Picket  ? ?

## 2021-11-02 ENCOUNTER — Encounter: Payer: Self-pay | Admitting: Internal Medicine

## 2021-11-02 DIAGNOSIS — Z713 Dietary counseling and surveillance: Secondary | ICD-10-CM | POA: Insufficient documentation

## 2021-11-02 NOTE — Assessment & Plan Note (Signed)
Low carb diet and exercise.  Follow met b and a1c.  ?

## 2021-11-02 NOTE — Assessment & Plan Note (Signed)
On prozac.  Stable.  Follow.  

## 2021-11-02 NOTE — Assessment & Plan Note (Signed)
S/p GI w/up 11/2019 - EGD and colonoscopy.  Follow cbc.  °

## 2021-11-02 NOTE — Assessment & Plan Note (Signed)
Have discussed wegovy.  Tolerated and did well with ozempic.  Insurance not covering.  Now insurance not covering wegovy.  He is changing insurance soon.  Will contact me once insurance change and will see if wegovy covered.  Follow. Low carb diet and exercise.  ?

## 2021-11-02 NOTE — Assessment & Plan Note (Signed)
Continue protonix.  Follow.  EGD 11/2019.  No upper symptoms reported.  

## 2021-11-02 NOTE — Assessment & Plan Note (Signed)
Still drinking.  Have discussed counseling, AA, etc.  He has declined.   Discussed need to decrease alcohol intake.  Follow.  ?

## 2021-11-08 ENCOUNTER — Encounter: Payer: Self-pay | Admitting: Internal Medicine

## 2021-11-11 ENCOUNTER — Other Ambulatory Visit: Payer: Self-pay

## 2021-11-11 MED ORDER — WEGOVY 0.25 MG/0.5ML ~~LOC~~ SOAJ
0.2500 mg | SUBCUTANEOUS | 3 refills | Status: DC
  Filled 2021-11-11 – 2022-08-09 (×8): qty 2, 28d supply, fill #0

## 2021-11-13 ENCOUNTER — Telehealth: Payer: Self-pay

## 2021-11-13 NOTE — Telephone Encounter (Signed)
Austin [name] (Key: [id number]) ?Rx #: [id number] ?BSJGGE 0.25MG /0.5ML auto-injectors ?  ?Form ?[employer] Electronic PA Form [phone] NCPDP) ?Created ?2 days ago ?Sent to Plan ?2 minutes ago ?Plan Response ?2 minutes ago ?Submit Clinical Questions ?1 minute ago ?Determination ?Wait for Determination ?Please wait for [employer] 2017 to return a determination. ?

## 2021-11-15 ENCOUNTER — Other Ambulatory Visit: Payer: Self-pay

## 2021-11-26 ENCOUNTER — Other Ambulatory Visit: Payer: Self-pay

## 2021-11-29 ENCOUNTER — Other Ambulatory Visit: Payer: Self-pay

## 2021-12-01 ENCOUNTER — Other Ambulatory Visit: Payer: Self-pay | Admitting: Internal Medicine

## 2021-12-02 ENCOUNTER — Other Ambulatory Visit: Payer: Self-pay

## 2021-12-02 MED ORDER — FLUOXETINE HCL 10 MG PO TABS
10.0000 mg | ORAL_TABLET | Freq: Every day | ORAL | 1 refills | Status: DC
  Filled 2021-12-02 – 2021-12-06 (×2): qty 30, 30d supply, fill #0

## 2021-12-06 ENCOUNTER — Telehealth: Payer: Self-pay

## 2021-12-06 ENCOUNTER — Other Ambulatory Visit: Payer: Self-pay

## 2021-12-06 ENCOUNTER — Encounter: Payer: Self-pay | Admitting: Internal Medicine

## 2021-12-06 MED ORDER — FLUOXETINE HCL 10 MG PO TABS: 10.0000 mg | ORAL_TABLET | Freq: Every day | ORAL | 1 refills | Status: DC

## 2021-12-06 NOTE — Telephone Encounter (Signed)
Your refill has been sent.  Thanks Denyse Amass! Nolberto Hanlon, CMA

## 2021-12-06 NOTE — Telephone Encounter (Signed)
sent 

## 2021-12-06 NOTE — Telephone Encounter (Signed)
Patient states he was trying to request a refill his FLUoxetine (PROZAC) 10 MG tablet through MyChart and it only gave him the option of choosing the hospital pharmacies.  Patient states his preferred pharmacy is [facility] in [place].  Patient states he would like to request a refill for his FLUoxetine (PROZAC) 10 MG tablet to be sent it to the [facility] in [place].  Please call patient when refill request has been submitted.

## 2021-12-11 ENCOUNTER — Telehealth: Payer: Self-pay

## 2021-12-11 NOTE — Telephone Encounter (Signed)
Austin [name] Key: [id number] PA Case ID: [id number]  Rx #: [id number]  Outcome Approved - today  [id number]:[id number] Status:Approved  Start Date:11/11/2021; Coverage End Date:12/11/2022; Drug FLUoxetine HCl 10MG  tablets Form [employer] Electronic PA Form [phone] NCPDP)

## 2022-01-03 ENCOUNTER — Other Ambulatory Visit: Payer: Self-pay

## 2022-01-03 MED ORDER — FLUOXETINE HCL 10 MG PO CAPS: 10.0000 mg | ORAL_CAPSULE | Freq: Every day | ORAL | 3 refills | Status: DC

## 2022-02-21 ENCOUNTER — Ambulatory Visit: Payer: BC Managed Care – PPO | Admitting: Internal Medicine

## 2022-02-21 ENCOUNTER — Encounter: Payer: Self-pay | Admitting: Internal Medicine

## 2022-02-21 DIAGNOSIS — K219 Gastro-esophageal reflux disease without esophagitis: Secondary | ICD-10-CM

## 2022-02-21 DIAGNOSIS — M7989 Other specified soft tissue disorders: Secondary | ICD-10-CM

## 2022-02-21 DIAGNOSIS — F320 Major depressive disorder, single episode, mild: Secondary | ICD-10-CM

## 2022-02-21 DIAGNOSIS — Z23 Encounter for immunization: Secondary | ICD-10-CM

## 2022-02-21 DIAGNOSIS — R739 Hyperglycemia, unspecified: Secondary | ICD-10-CM | POA: Diagnosis not present

## 2022-02-21 DIAGNOSIS — F1029 Alcohol dependence with unspecified alcohol-induced disorder: Secondary | ICD-10-CM | POA: Diagnosis not present

## 2022-02-21 NOTE — Progress Notes (Signed)
Patient ID: Austin [name], male   DOB: May 11, 1981, 41 y.o.   MRN: [id number]   Subjective:    Patient ID: Austin [name], male    DOB: [birth year], 41 y.o.   MRN: [id number]   Patient here for a scheduled follow up.   Chief Complaint  Patient presents with   Follow-up   .   HPI Here to follow up regarding his blood sugar, increased stress and weight loss.  Reports he is doing relatively well.  Tries to stay active.  No chest pain or sob reported.  Not doing formal exercise.  No cough or congestion.  No abdominal pain.  Bowels moving.  Reports persistent swelling - foot.  Shoe - leaves indention.  Persistent.  Have discussed compression hose.  Supply problems with wegovy.  Was doing well on wegovy.  Was losing weight.  Sugars improved.    Past Medical History:  Diagnosis Date   Alcohol abuse    history of   Allergy    Anxiety    Depression    GERD (gastroesophageal reflux disease)    Heart murmur    Hypertension    Ulcer    history of bleeding ulcer requiring blood transfusions   Past Surgical History:  Procedure Laterality Date   COLONOSCOPY WITH PROPOFOL N/A 12/12/2019   Procedure: COLONOSCOPY WITH PROPOFOL;  Surgeon: Toledo, Benay Pike, MD;  Location: [facility];  Service: Gastroenterology;  Laterality: N/A;   ESOPHAGOGASTRODUODENOSCOPY (EGD) WITH PROPOFOL N/A 12/12/2019   Procedure: ESOPHAGOGASTRODUODENOSCOPY (EGD) WITH PROPOFOL;  Surgeon: Toledo, Benay Pike, MD;  Location: [facility];  Service: Gastroenterology;  Laterality: N/A;   KNEE SURGERY     Left knee - ACL   TONSILLECTOMY AND ADENOIDECTOMY  1992   Family History  Problem Relation Age of Onset   Alcohol abuse Father    Cancer Father        prostate cancer   Arthritis Sister    Cancer Maternal Grandmother        breast cancer   Stroke Maternal Grandmother    Diabetes Maternal Grandmother    Hypertension Maternal Grandmother    Social History   Socioeconomic History   Marital status: Married    Spouse  name: Not on file   Number of children: Not on file   Years of education: Not on file   Highest education level: Not on file  Occupational History   Not on file  Tobacco Use   Smoking status: Former    Packs/day: 0.50    Types: Cigarettes   Smokeless tobacco: Never   Tobacco comments:    Quit smoking recently  Vaping Use   Vaping Use: Never used  Substance and Sexual Activity   Alcohol use: Yes    Alcohol/week: 2.0 standard drinks of alcohol    Types: 2 Cans of beer per week   Drug use: Yes    Types: Marijuana    Comment: marijuana   Sexual activity: Not on file  Other Topics Concern   Not on file  Social History Narrative   Not on file   Social Determinants of Health   Financial Resource Strain: Low Risk  (02/14/2021)   Overall Financial Resource Strain (CARDIA)    Difficulty of Paying Living Expenses: Not hard at all  Food Insecurity: Not on file  Transportation Needs: Not on file  Physical Activity: Not on file  Stress: Not on file  Social Connections: Not on file     Review of Systems  Constitutional:  Negative for appetite change and unexpected weight change.       Has noticed since being off wegovy - not feel full as fast.   HENT:  Negative for congestion and sinus pressure.   Respiratory:  Negative for cough, chest tightness and shortness of breath.   Cardiovascular:  Negative for chest pain and palpitations.  Gastrointestinal:  Negative for abdominal pain, diarrhea, nausea and vomiting.  Genitourinary:  Negative for difficulty urinating and dysuria.  Musculoskeletal:  Negative for joint swelling and myalgias.       Foot swelling as outlined.   Skin:  Negative for color change and rash.  Neurological:  Negative for dizziness, light-headedness and headaches.  Psychiatric/Behavioral:  Negative for agitation and dysphoric mood.        Objective:     BP 138/78 (BP Location: Left Arm, Patient Position: Sitting, Cuff Size: Large)   Pulse 87   Temp 99.1 F  (37.3 C) (Oral)   Ht 5' 4"  (1.626 m)   Wt 177 lb 6.4 oz (80.5 kg)   SpO2 99%   BMI 30.45 kg/m  Wt Readings from Last 3 Encounters:  02/21/22 177 lb 6.4 oz (80.5 kg)  10/22/21 167 lb (75.8 kg)  06/21/21 161 lb (73 kg)    Physical Exam Constitutional:      General: He is not in acute distress.    Appearance: Normal appearance. He is well-developed.  HENT:     Head: Normocephalic and atraumatic.     Right Ear: External ear normal.     Left Ear: External ear normal.  Eyes:     General: No scleral icterus.       Right eye: No discharge.        Left eye: No discharge.  Cardiovascular:     Rate and Rhythm: Normal rate and regular rhythm.  Pulmonary:     Effort: Pulmonary effort is normal. No respiratory distress.     Breath sounds: Normal breath sounds.  Abdominal:     General: Bowel sounds are normal.     Palpations: Abdomen is soft.     Tenderness: There is no abdominal tenderness.  Musculoskeletal:        General: No tenderness.     Cervical back: Neck supple. No tenderness.     Comments: Foot swelling as outlined.  No increased erythema.    Lymphadenopathy:     Cervical: No cervical adenopathy.  Skin:    Findings: No erythema or rash.  Neurological:     Mental Status: He is alert.  Psychiatric:        Mood and Affect: Mood normal.        Behavior: Behavior normal.      Outpatient Encounter Medications as of 02/21/2022  Medication Sig   acetaminophen (TYLENOL) 500 MG tablet Take 500 mg by mouth 2 (two) times daily as needed.   Ascorbic Acid (VITAMIN C) 100 MG tablet Take 100 mg by mouth daily.   cetirizine (ZYRTEC) 10 MG tablet Take 10 mg by mouth daily.   FLUoxetine (PROZAC) 10 MG capsule Take 1 capsule (10 mg total) by mouth daily.   pantoprazole (PROTONIX) 40 MG tablet TAKE 1 TABLET BY MOUTH TWICE DAILY   Semaglutide-Weight Management (WEGOVY) 0.25 MG/0.5ML SOAJ Inject 0.25 mg into the skin once a week.   dicyclomine (BENTYL) 10 MG capsule TAKE 1 CAPSULE BY  MOUTH 4 TIMES DAILY BEFORE MEALS AND NIGHTLY   No facility-administered encounter medications on file as of 02/21/2022.  Lab Results  Component Value Date   WBC 5.3 06/21/2021   HGB 14.4 06/21/2021   HCT 42.2 06/21/2021   PLT 302.0 06/21/2021   GLUCOSE 85 10/22/2021   CHOL 196 10/22/2021   TRIG 69.0 10/22/2021   HDL 81.30 10/22/2021   LDLCALC 101 (H) 10/22/2021   ALT 24 10/22/2021   AST 21 10/22/2021   NA 139 10/22/2021   K 4.5 10/22/2021   CL 101 10/22/2021   CREATININE 0.80 10/22/2021   BUN 9 10/22/2021   CO2 31 10/22/2021   TSH 0.84 10/22/2021   PSA 0.24 10/22/2021   HGBA1C 5.8 10/22/2021       Assessment & Plan:   Problem List Items Addressed This Visit     Alcohol dependence ([place])    Still drinking.  Have discussed counseling, AA, etc.  He has declined.   Discussed need to decrease alcohol intake.  Working a second job.  States will force him to cut down.  Follow.        Relevant Orders   Hepatic function panel   Foot swelling    Persistent swelling.  Has tried compression hose.  No known injury or trauma.  Refer to vascular for further evaluation.       Relevant Orders   Ambulatory referral to Vascular Surgery   GERD (gastroesophageal reflux disease)    Continue protonix.  Follow.  EGD 11/2019.  No upper symptoms reported.       Hyperglycemia    Low carb diet and exercise.  Follow met b and a1c. Decrease supply of wegovy.  Discussed.  Sugars had improved.  Follow.       Relevant Orders   Basic metabolic panel   Lipid panel   Hemoglobin A1c   Major depressive disorder, single episode, mild (HCC)    Continue on prozac.  Stable.  Follow.       Other Visit Diagnoses     Need for immunization against influenza       Relevant Orders   Flu Vaccine QUAD 40moIM (Fluarix, Fluzone & Alfiuria Quad PF) (Completed)        CEinar Pheasant MD

## 2022-02-26 ENCOUNTER — Other Ambulatory Visit: Payer: Self-pay

## 2022-02-27 ENCOUNTER — Other Ambulatory Visit: Payer: Self-pay

## 2022-02-28 ENCOUNTER — Other Ambulatory Visit: Payer: Self-pay

## 2022-03-03 ENCOUNTER — Encounter: Payer: Self-pay | Admitting: Internal Medicine

## 2022-03-03 DIAGNOSIS — F320 Major depressive disorder, single episode, mild: Secondary | ICD-10-CM | POA: Insufficient documentation

## 2022-03-03 NOTE — Assessment & Plan Note (Signed)
Still drinking.  Have discussed counseling, AA, etc.  He has declined.   Discussed need to decrease alcohol intake.  Working a second job.  States will force him to cut down.  Follow.

## 2022-03-03 NOTE — Assessment & Plan Note (Signed)
Continue protonix.  Follow.  EGD 11/2019.  No upper symptoms reported.

## 2022-03-03 NOTE — Assessment & Plan Note (Signed)
Persistent swelling.  Has tried compression hose.  No known injury or trauma.  Refer to vascular for further evaluation.

## 2022-03-03 NOTE — Assessment & Plan Note (Signed)
Continue on prozac.  Stable.  Follow.

## 2022-03-03 NOTE — Assessment & Plan Note (Signed)
Low carb diet and exercise.  Follow met b and a1c. Decrease supply of wegovy.  Discussed.  Sugars had improved.  Follow.

## 2022-03-21 ENCOUNTER — Other Ambulatory Visit (INDEPENDENT_AMBULATORY_CARE_PROVIDER_SITE_OTHER): Payer: BC Managed Care – PPO

## 2022-03-21 DIAGNOSIS — R739 Hyperglycemia, unspecified: Secondary | ICD-10-CM | POA: Diagnosis not present

## 2022-03-21 DIAGNOSIS — F1029 Alcohol dependence with unspecified alcohol-induced disorder: Secondary | ICD-10-CM

## 2022-03-21 LAB — HEPATIC FUNCTION PANEL
ALT: 18 U/L (ref 0–53)
AST: 16 U/L (ref 0–37)
Albumin: 4.4 g/dL (ref 3.5–5.2)
Alkaline Phosphatase: 66 U/L (ref 39–117)
Bilirubin, Direct: 0.1 mg/dL (ref 0.0–0.3)
Total Bilirubin: 0.5 mg/dL (ref 0.2–1.2)
Total Protein: 7.2 g/dL (ref 6.0–8.3)

## 2022-03-21 LAB — BASIC METABOLIC PANEL
BUN: 9 mg/dL (ref 6–23)
CO2: 31 mEq/L (ref 19–32)
Calcium: 9.5 mg/dL (ref 8.4–10.5)
Chloride: 101 mEq/L (ref 96–112)
Creatinine, Ser: 0.94 mg/dL (ref 0.40–1.50)
GFR: 101.41 mL/min (ref >60.00)
Glucose, Bld: 93 mg/dL (ref 70–99)
Potassium: 4.5 mEq/L (ref 3.5–5.1)
Sodium: 140 mEq/L (ref 135–145)

## 2022-03-21 LAB — LIPID PANEL
Cholesterol: 194 mg/dL (ref 0–200)
HDL: 70.4 mg/dL (ref >39.00)
LDL Cholesterol: 115 mg/dL — ABNORMAL HIGH (ref 0–99)
NonHDL: 123.14
Total CHOL/HDL Ratio: 3
Triglycerides: 43 mg/dL (ref 0.0–149.0)
VLDL: 8.6 mg/dL (ref 0.0–40.0)

## 2022-03-24 LAB — HEMOGLOBIN A1C: Hgb A1c MFr Bld: 6 % (ref 4.6–6.5)

## 2022-03-25 ENCOUNTER — Ambulatory Visit (INDEPENDENT_AMBULATORY_CARE_PROVIDER_SITE_OTHER): Payer: BC Managed Care – PPO | Admitting: Vascular Surgery

## 2022-03-25 ENCOUNTER — Encounter (INDEPENDENT_AMBULATORY_CARE_PROVIDER_SITE_OTHER): Payer: Self-pay | Admitting: Vascular Surgery

## 2022-03-25 VITALS — BP 142/89 | HR 80 | Resp 18 | Ht 64.0 in | Wt 175.6 lb

## 2022-03-25 DIAGNOSIS — M7989 Other specified soft tissue disorders: Secondary | ICD-10-CM | POA: Diagnosis not present

## 2022-03-25 NOTE — Assessment & Plan Note (Signed)
Recommend:  I have had a long discussion with the patient regarding swelling and why it  causes symptoms.  Patient will begin wearing graduated compression on a daily basis. The patient will  wear the stockings first thing in the morning and removing them in the evening. The patient is instructed specifically not to sleep in the stockings.   In addition, behavioral modification will be initiated.  This will include frequent elevation, use of over the counter pain medications and exercise such as walking.  Consideration for a lymph pump will also be made based upon the effectiveness of conservative therapy.  This would help to improve the edema control and prevent sequela such as ulcers and infections   Patient should undergo duplex ultrasound of the venous system to ensure that DVT or reflux is not present.  The patient will follow-up with me after the ultrasound.   

## 2022-03-25 NOTE — Progress Notes (Signed)
Patient ID: Austin [name], male   DOB: [birth year], 40 y.o.   MRN: [id number]  Chief Complaint  Patient presents with   Establish Care    Referred by Dr Lorin Picket    HPI Austin [name] is a 41 y.o. male.  I am asked to see the patient by Dr. Lorin Picket for evaluation of left foot and ankle swelling.  This has been going on for several months now.  He has had essentially a bunion on the bottom of his foot cut out by podiatry several times on that left side.  He has had persistent foot and ankle swelling on that left side now that has been refractory to compression, elevation, and normal activities now for several months.  The swelling progresses as the day goes on.  When he first wakes the swelling is minimal.  The right side has no swelling or symptoms.  He is also had an ACL repair on the left side.  No ulceration or infection.  No fevers or chills.  No history of DVT or superficial thrombophlebitis to his knowledge.     Past Medical History:  Diagnosis Date   Alcohol abuse    history of   Allergy    Anxiety    Depression    GERD (gastroesophageal reflux disease)    Heart murmur    Hypertension    Ulcer    history of bleeding ulcer requiring blood transfusions    Past Surgical History:  Procedure Laterality Date   COLONOSCOPY WITH PROPOFOL N/A 12/12/2019   Procedure: COLONOSCOPY WITH PROPOFOL;  Surgeon: Toledo, Boykin Nearing, MD;  Location: [facility];  Service: Gastroenterology;  Laterality: N/A;   ESOPHAGOGASTRODUODENOSCOPY (EGD) WITH PROPOFOL N/A 12/12/2019   Procedure: ESOPHAGOGASTRODUODENOSCOPY (EGD) WITH PROPOFOL;  Surgeon: Toledo, Boykin Nearing, MD;  Location: [facility];  Service: Gastroenterology;  Laterality: N/A;   KNEE SURGERY     Left knee - ACL   TONSILLECTOMY AND ADENOIDECTOMY  1992     Family History  Problem Relation Age of Onset   Alcohol abuse Father    Cancer Father        prostate cancer   Arthritis Sister    Cancer Maternal Grandmother        breast cancer    Stroke Maternal Grandmother    Diabetes Maternal Grandmother    Hypertension Maternal Grandmother       Social History   Tobacco Use   Smoking status: Former    Packs/day: 0.50    Types: Cigarettes   Smokeless tobacco: Never   Tobacco comments:    Quit smoking recently  Vaping Use   Vaping Use: Never used  Substance Use Topics   Alcohol use: Yes    Alcohol/week: 2.0 standard drinks of alcohol    Types: 2 Cans of beer per week   Drug use: Yes    Types: Marijuana    Comment: marijuana     Allergies  Allergen Reactions   Sulfa Antibiotics     Current Outpatient Medications  Medication Sig Dispense Refill   acetaminophen (TYLENOL) 500 MG tablet Take 500 mg by mouth 2 (two) times daily as needed.     Ascorbic Acid (VITAMIN C) 100 MG tablet Take 100 mg by mouth daily.     cetirizine (ZYRTEC) 10 MG tablet Take 10 mg by mouth daily.     FLUoxetine (PROZAC) 10 MG capsule Take 1 capsule (10 mg total) by mouth daily. 90 capsule 3   pantoprazole (PROTONIX) 40 MG  tablet TAKE 1 TABLET BY MOUTH TWICE DAILY 180 tablet 1   dicyclomine (BENTYL) 10 MG capsule TAKE 1 CAPSULE BY MOUTH 4 TIMES DAILY BEFORE MEALS AND NIGHTLY 120 capsule 11   Semaglutide-Weight Management (WEGOVY) 0.25 MG/0.5ML SOAJ Inject 0.25 mg into the skin once a week. (Patient not taking: Reported on 03/25/2022) 2 mL 3   No current facility-administered medications for this visit.      REVIEW OF SYSTEMS (Negative unless checked)  Constitutional: [] Weight loss  [] Fever  [] Chills Cardiac: [] Chest pain   [] Chest pressure   [] Palpitations   [] Shortness of breath when laying flat   [] Shortness of breath at rest   [] Shortness of breath with exertion. Vascular:  [] Pain in legs with walking   [] Pain in legs at rest   [] Pain in legs when laying flat   [] Claudication   [] Pain in feet when walking  [] Pain in feet at rest  [] Pain in feet when laying flat   [] History of DVT   [] Phlebitis   [x] Swelling in legs   [] Varicose veins    [] Non-healing ulcers Pulmonary:   [] Uses home oxygen   [] Productive cough   [] Hemoptysis   [] Wheeze  [] COPD   [] Asthma Neurologic:  [] Dizziness  [] Blackouts   [] Seizures   [] History of stroke   [] History of TIA  [] Aphasia   [] Temporary blindness   [] Dysphagia   [] Weakness or numbness in arms   [] Weakness or numbness in legs Musculoskeletal:  [] Arthritis   [] Joint swelling   [] Joint pain   [] Low back pain Hematologic:  [] Easy bruising  [] Easy bleeding   [] Hypercoagulable state   [] Anemic  [] Hepatitis Gastrointestinal:  [] Blood in stool   [] Vomiting blood  [x] Gastroesophageal reflux/heartburn   [] Abdominal pain Genitourinary:  [] Chronic kidney disease   [] Difficult urination  [] Frequent urination  [] Burning with urination   [] Hematuria Skin:  [] Rashes   [] Ulcers   [] Wounds Psychological:  [x] History of anxiety   [x]  History of major depression.    Physical Exam BP (!) 142/89 (BP Location: Right Arm)   Pulse 80   Resp 18   Ht 5\' 4"  (1.626 m)   Wt 175 lb 9.6 oz (79.7 kg)   BMI 30.14 kg/m  Gen:  WD/WN, NAD Head: [place]/AT, No temporalis wasting.  Ear/Nose/Throat: Hearing grossly intact, nares w/o erythema or drainage, oropharynx w/o Erythema/Exudate Eyes: Conjunctiva clear, sclera non-icteric  Neck: trachea midline.  No JVD.  Pulmonary:  Good air movement, respirations not labored, no use of accessory muscles  Cardiac: RRR, no JVD Vascular:  Vessel Right Left  Radial Palpable Palpable                          DP Palpable Palpable  PT T Palpable Palpable   Gastrointestinal:. No masses, surgical incisions, or scars. Musculoskeletal: M/S 5/5 throughout.  Extremities without ischemic changes.  No deformity or atrophy.  Trace left foot and ankle edema. Neurologic: Sensation grossly intact in extremities.  Symmetrical.  Speech is fluent. Motor exam as listed above. Psychiatric: Judgment intact, Mood & affect appropriate for pt's clinical situation. Dermatologic: No rashes or ulcers noted.   No cellulitis or open wounds.    Radiology No results found.  Labs Recent Results (from the past 2160 hour(s))  Lipid panel     Status: Abnormal   Collection Time: 03/21/22  7:44 AM  Result Value Ref Range   Cholesterol 194 0 - 200 mg/dL    Comment: ATP III Classification  Desirable:  < 200 mg/dL               Borderline High:  200 - 239 mg/dL          High:  > = 240 mg/dL   Triglycerides 43.0 0.0 - 149.0 mg/dL    Comment: Normal:  <150 mg/dLBorderline High:  150 - 199 mg/dL   HDL 70.40 >39.00 mg/dL   VLDL 8.6 0.0 - 40.0 mg/dL   LDL Cholesterol 115 (H) 0 - 99 mg/dL   Total CHOL/HDL Ratio 3     Comment:                Men          Women1/2 Average Risk     3.4          3.3Average Risk          5.0          4.42X Average Risk          9.6          7.13X Average Risk          15.0          11.0                       NonHDL 123.14     Comment: NOTE:  Non-HDL goal should be 30 mg/dL higher than patient's LDL goal (i.e. LDL goal of < 70 mg/dL, would have non-HDL goal of < 100 mg/dL)  Hepatic function panel     Status: None   Collection Time: 03/21/22  7:44 AM  Result Value Ref Range   Total Bilirubin 0.5 0.2 - 1.2 mg/dL   Bilirubin, Direct 0.1 0.0 - 0.3 mg/dL   Alkaline Phosphatase 66 39 - 117 U/L   AST 16 0 - 37 U/L   ALT 18 0 - 53 U/L   Total Protein 7.2 6.0 - 8.3 g/dL   Albumin 4.4 3.5 - 5.2 g/dL  Basic metabolic panel     Status: None   Collection Time: 03/21/22  7:44 AM  Result Value Ref Range   Sodium 140 135 - 145 mEq/L   Potassium 4.5 3.5 - 5.1 mEq/L   Chloride 101 96 - 112 mEq/L   CO2 31 19 - 32 mEq/L   Glucose, Bld 93 70 - 99 mg/dL   BUN 9 6 - 23 mg/dL   Creatinine, Ser 0.94 0.40 - 1.50 mg/dL   GFR 101.41 >60.00 mL/min    Comment: Calculated using the CKD-EPI Creatinine Equation (2021)   Calcium 9.5 8.4 - 10.5 mg/dL  Hemoglobin A1c     Status: None   Collection Time: 03/21/22  9:26 AM  Result Value Ref Range   Hgb A1c MFr Bld 6.0 4.6 - 6.5 %    Comment:  Glycemic Control Guidelines for People with Diabetes:Non Diabetic:  <6%Goal of Therapy: <7%Additional Action Suggested:  >8%     Assessment/Plan:  Swelling of limb Recommend:  I have had a long discussion with the patient regarding swelling and why it  causes symptoms.  Patient will begin wearing graduated compression on a daily basis. The patient will  wear the stockings first thing in the morning and removing them in the evening. The patient is instructed specifically not to sleep in the stockings.   In addition, behavioral modification will be initiated.  This will include frequent elevation, use of over the counter pain medications and exercise such  as walking.  Consideration for a lymph pump will also be made based upon the effectiveness of conservative therapy.  This would help to improve the edema control and prevent sequela such as ulcers and infections   Patient should undergo duplex ultrasound of the venous system to ensure that DVT or reflux is not present.  The patient will follow-up with me after the ultrasound.       Leotis Pain 03/25/2022, 11:30 AM   This note was created with Dragon medical transcription system.  Any errors from dictation are unintentional.

## 2022-04-22 ENCOUNTER — Ambulatory Visit (INDEPENDENT_AMBULATORY_CARE_PROVIDER_SITE_OTHER): Payer: BC Managed Care – PPO | Admitting: Nurse Practitioner

## 2022-04-22 ENCOUNTER — Ambulatory Visit (INDEPENDENT_AMBULATORY_CARE_PROVIDER_SITE_OTHER): Payer: BC Managed Care – PPO

## 2022-04-22 ENCOUNTER — Encounter (INDEPENDENT_AMBULATORY_CARE_PROVIDER_SITE_OTHER): Payer: Self-pay | Admitting: Nurse Practitioner

## 2022-04-22 DIAGNOSIS — M7989 Other specified soft tissue disorders: Secondary | ICD-10-CM | POA: Diagnosis not present

## 2022-04-23 ENCOUNTER — Encounter: Payer: Self-pay | Admitting: Internal Medicine

## 2022-04-24 ENCOUNTER — Encounter: Payer: Self-pay | Admitting: Internal Medicine

## 2022-04-25 ENCOUNTER — Other Ambulatory Visit: Payer: Self-pay

## 2022-04-25 MED ORDER — FLUOXETINE HCL 10 MG PO CAPS: 10.0000 mg | ORAL_CAPSULE | Freq: Every day | ORAL | 3 refills | Status: DC

## 2022-04-25 NOTE — Telephone Encounter (Signed)
Form completed and placed in box at Marisa's desk  

## 2022-04-27 ENCOUNTER — Encounter (INDEPENDENT_AMBULATORY_CARE_PROVIDER_SITE_OTHER): Payer: Self-pay | Admitting: Nurse Practitioner

## 2022-04-27 NOTE — Progress Notes (Signed)
Subjective:    Patient ID: Austin [name], male    DOB: 1980-11-30, 41 y.o.   MRN: [id number] No chief complaint on file.   Austin [name] is a 41 y.o. male.  I am asked to see the patient by Dr. Lorin Picket for evaluation of left foot and ankle swelling.  This has been going on for several months now.  He has had essentially a bunion on the bottom of his foot cut out by podiatry several times on that left side.  He has had persistent foot and ankle swelling on that left side now that has been refractory to compression, elevation, and normal activities now for several months.  The swelling progresses as the day goes on.  When he first wakes the swelling is minimal.  The right side has no swelling or symptoms.  He is also had an ACL repair on the left side.  No ulceration or infection.  No fevers or chills.  No history of DVT or superficial thrombophlebitis to his knowledge.      Today noninvasive studies show no evidence of DVT or superficial thrombophlebitis in the left lower extremity.  No evidence of deep venous insufficiency.  There is however evidence of reflux in the left great saphenous vein.    Review of Systems  Cardiovascular:  Positive for leg swelling.  All other systems reviewed and are negative.      Objective:   Physical Exam Vitals reviewed.  HENT:     Head: Normocephalic.  Cardiovascular:     Rate and Rhythm: Normal rate.     Pulses: Normal pulses.  Pulmonary:     Effort: Pulmonary effort is normal.  Musculoskeletal:     Left lower leg: Edema present.  Skin:    General: Skin is warm and dry.  Neurological:     Mental Status: He is alert and oriented to person, place, and time.  Psychiatric:        Mood and Affect: Mood normal.        Behavior: Behavior normal.        Thought Content: Thought content normal.        Judgment: Judgment normal.     BP (!) 145/97 (BP Location: Right Arm)   Pulse 77   Resp 18   Ht 5\' 4"  (1.626 m)   Wt 175 lb 9.6 oz (79.7 kg)   BMI  30.14 kg/m   Past Medical History:  Diagnosis Date   Alcohol abuse    history of   Allergy    Anxiety    Depression    GERD (gastroesophageal reflux disease)    Heart murmur    Hypertension    Ulcer    history of bleeding ulcer requiring blood transfusions    Social History   Socioeconomic History   Marital status: Married    Spouse name: Not on file   Number of children: Not on file   Years of education: Not on file   Highest education level: Not on file  Occupational History   Not on file  Tobacco Use   Smoking status: Former    Packs/day: 0.50    Types: Cigarettes   Smokeless tobacco: Never   Tobacco comments:    Quit smoking recently  Vaping Use   Vaping Use: Never used  Substance and Sexual Activity   Alcohol use: Yes    Alcohol/week: 2.0 standard drinks of alcohol    Types: 2 Cans of beer per week  Drug use: Yes    Types: Marijuana    Comment: marijuana   Sexual activity: Not on file  Other Topics Concern   Not on file  Social History Narrative   Not on file   Social Determinants of Health   Financial Resource Strain: Low Risk  (02/14/2021)   Overall Financial Resource Strain (CARDIA)    Difficulty of Paying Living Expenses: Not hard at all  Food Insecurity: Not on file  Transportation Needs: Not on file  Physical Activity: Not on file  Stress: Not on file  Social Connections: Not on file  Intimate Partner Violence: Not on file    Past Surgical History:  Procedure Laterality Date   COLONOSCOPY WITH PROPOFOL N/A 12/12/2019   Procedure: COLONOSCOPY WITH PROPOFOL;  Surgeon: Toledo, Benay Pike, MD;  Location: [facility];  Service: Gastroenterology;  Laterality: N/A;   ESOPHAGOGASTRODUODENOSCOPY (EGD) WITH PROPOFOL N/A 12/12/2019   Procedure: ESOPHAGOGASTRODUODENOSCOPY (EGD) WITH PROPOFOL;  Surgeon: Toledo, Benay Pike, MD;  Location: [facility];  Service: Gastroenterology;  Laterality: N/A;   KNEE SURGERY     Left knee - ACL   TONSILLECTOMY  AND ADENOIDECTOMY  1992    Family History  Problem Relation Age of Onset   Alcohol abuse Father    Cancer Father        prostate cancer   Arthritis Sister    Cancer Maternal Grandmother        breast cancer   Stroke Maternal Grandmother    Diabetes Maternal Grandmother    Hypertension Maternal Grandmother     Allergies  Allergen Reactions   Sulfa Antibiotics        Latest Ref Rng & Units 06/21/2021    7:59 AM 09/10/2020   11:29 AM 01/14/2019    8:31 AM  CBC  WBC 4.0 - 10.5 K/uL 5.3  5.5  7.1   Hemoglobin 13.0 - 17.0 g/dL 14.4  14.5  14.8   Hematocrit 39.0 - 52.0 % 42.2  42.6  43.1   Platelets 150.0 - 400.0 K/uL 302.0  284.0  298       CMP     Component Value Date/Time   NA 140 03/21/2022 0744   NA 141 02/02/2018 0841   K 4.5 03/21/2022 0744   CL 101 03/21/2022 0744   CO2 31 03/21/2022 0744   GLUCOSE 93 03/21/2022 0744   BUN 9 03/21/2022 0744   BUN 9 02/02/2018 0841   CREATININE 0.94 03/21/2022 0744   CALCIUM 9.5 03/21/2022 0744   PROT 7.2 03/21/2022 0744   PROT 7.1 02/02/2018 0841   ALBUMIN 4.4 03/21/2022 0744   ALBUMIN 4.8 02/02/2018 0841   AST 16 03/21/2022 0744   ALT 18 03/21/2022 0744   ALKPHOS 66 03/21/2022 0744   BILITOT 0.5 03/21/2022 0744   BILITOT 0.5 02/02/2018 0841   GFRNONAA >60 01/14/2019 0833   GFRAA >60 01/14/2019 0833     No results found.     Assessment & Plan:   1. Swelling of limb Today noninvasive studies show evidence of reflux in the left great saphenous vein.  I discussed how it is reflux can cause symptoms of pain and discomfort.  At this time the patient's overarching issue is swelling.  We discussed use of medical grade compression, elevation and activity.  Compression [name] should be worn daily, placed in the morning and remove prior to sleeping.  Following the studies today as well as the discussion, the patient will adhere to conservative therapy.  The patient will follow-up  with [place] on an as-needed basis. - VAS [place]  LOWER EXTREMITY VENOUS REFLUX   Current Outpatient Medications on File Prior to Visit  Medication Sig Dispense Refill   acetaminophen (TYLENOL) 500 MG tablet Take 500 mg by mouth 2 (two) times daily as needed.     Ascorbic Acid (VITAMIN C) 100 MG tablet Take 100 mg by mouth daily.     cetirizine (ZYRTEC) 10 MG tablet Take 10 mg by mouth daily.     pantoprazole (PROTONIX) 40 MG tablet TAKE 1 TABLET BY MOUTH TWICE DAILY 180 tablet 1   dicyclomine (BENTYL) 10 MG capsule TAKE 1 CAPSULE BY MOUTH 4 TIMES DAILY BEFORE MEALS AND NIGHTLY 120 capsule 11   Semaglutide-Weight Management (WEGOVY) 0.25 MG/0.5ML SOAJ Inject 0.25 mg into the skin once a week. (Patient not taking: Reported on 03/25/2022) 2 mL 3   No current facility-administered medications on file prior to visit.    There are no Patient Instructions on file for this visit. No follow-ups on file.   Georgiana Spinner, NP

## 2022-04-28 ENCOUNTER — Encounter (INDEPENDENT_AMBULATORY_CARE_PROVIDER_SITE_OTHER): Payer: Self-pay

## 2022-06-03 ENCOUNTER — Encounter: Payer: Self-pay | Admitting: Internal Medicine

## 2022-06-05 MED ORDER — AZITHROMYCIN 250 MG PO TABS: ORAL_TABLET | ORAL | 0 refills | Status: AC

## 2022-06-05 MED ORDER — SCOPOLAMINE 1 MG/3DAYS TD PT72: 1.0000 | MEDICATED_PATCH | TRANSDERMAL | 0 refills | Status: DC

## 2022-06-05 MED ORDER — PREDNISONE 10 MG PO TABS: ORAL_TABLET | ORAL | 0 refills | Status: DC

## 2022-06-05 NOTE — Telephone Encounter (Signed)
I sent in the three prescriptions to [facility].  ( I am not sure if this is the correct pharmacy).  Need to confirm was sent to correct pharmacy.

## 2022-06-05 NOTE — Telephone Encounter (Signed)
I can send in scopolamine patches. Please confirm he has taken prednisone and tolerated.  If so,  I can also send in zpak and prednisone taper to have if gets sick.  Regarding mounjaro, he does not have a diagnosis of diabetes.  (He does have a history of elevated sugar, but no diagnosis of diabetes).  Will need to continue with wegovy - given no diabetes diagnosis.

## 2022-06-25 ENCOUNTER — Ambulatory Visit: Payer: BC Managed Care – PPO | Admitting: Internal Medicine

## 2022-07-11 ENCOUNTER — Other Ambulatory Visit: Payer: Self-pay | Admitting: Internal Medicine

## 2022-07-11 ENCOUNTER — Other Ambulatory Visit: Payer: Self-pay

## 2022-07-11 DIAGNOSIS — K2100 Gastro-esophageal reflux disease with esophagitis, without bleeding: Secondary | ICD-10-CM

## 2022-07-14 ENCOUNTER — Other Ambulatory Visit: Payer: Self-pay | Admitting: Internal Medicine

## 2022-07-14 ENCOUNTER — Other Ambulatory Visit: Payer: Self-pay

## 2022-07-14 DIAGNOSIS — K2100 Gastro-esophageal reflux disease with esophagitis, without bleeding: Secondary | ICD-10-CM

## 2022-07-14 MED FILL — Pantoprazole Sodium EC Tab 40 MG (Base Equiv): ORAL | 30 days supply | Qty: 60 | Fill #0 | Status: AC

## 2022-07-15 ENCOUNTER — Other Ambulatory Visit: Payer: Self-pay

## 2022-07-16 ENCOUNTER — Other Ambulatory Visit (HOSPITAL_BASED_OUTPATIENT_CLINIC_OR_DEPARTMENT_OTHER): Payer: Self-pay

## 2022-08-05 ENCOUNTER — Other Ambulatory Visit: Payer: Self-pay

## 2022-08-07 ENCOUNTER — Other Ambulatory Visit: Payer: Self-pay

## 2022-08-09 ENCOUNTER — Other Ambulatory Visit: Payer: Self-pay

## 2022-08-09 MED FILL — Pantoprazole Sodium EC Tab 40 MG (Base Equiv): ORAL | 30 days supply | Qty: 60 | Fill #1 | Status: CN

## 2022-08-11 ENCOUNTER — Other Ambulatory Visit: Payer: Self-pay

## 2022-08-22 ENCOUNTER — Other Ambulatory Visit: Payer: Self-pay

## 2022-08-25 ENCOUNTER — Other Ambulatory Visit: Payer: Self-pay

## 2022-08-25 MED FILL — Pantoprazole Sodium EC Tab 40 MG (Base Equiv): ORAL | 30 days supply | Qty: 60 | Fill #1 | Status: AC

## 2022-09-15 ENCOUNTER — Other Ambulatory Visit (HOSPITAL_COMMUNITY): Payer: Self-pay

## 2022-09-16 ENCOUNTER — Other Ambulatory Visit (HOSPITAL_COMMUNITY): Payer: Self-pay

## 2022-10-02 ENCOUNTER — Telehealth: Payer: Self-pay

## 2022-10-02 ENCOUNTER — Other Ambulatory Visit (HOSPITAL_COMMUNITY): Payer: Self-pay

## 2022-10-02 NOTE — Telephone Encounter (Signed)
Pharmacy Patient Advocate Encounter   Received notification that prior authorization for [facility] 0.25mg /0.24ml is required/requested.  Per Test Claim: Prior authorization required   PA submitted on 10/02/22 to (ins) [employer] via CoverMyMeds Key  #  [id number] Status is pending

## 2022-10-03 NOTE — Telephone Encounter (Signed)
Pharmacy Patient Advocate Encounter  Received notification from [employer] that the request for prior authorization for [facility] has been denied due to Coverage is provided when at baseline, the patient had a body mass index of greater  than or equal to 30 kilograms per meter squared (30 kg/m2); or, when at baseline, the patient had a body mass index greater than or equal to 27 kg/m2 and at least one of the following weight-related comorbidities: hypertension, type 2 diabetes, dyslipidemia, obstructive sleep apnea, cardiovascular disease, knee osteoarthritis, asthma, chronic obstructive pulmonary disease, non-alcoholic fatty liver disease, polycystic ovarian syndrome, or coronary artery disease. This refers to baseline prior to any glucagon-like  peptide-1 (GLP-1) agonist (for example, Saxenda, [id number]) or GLP-1/glucosedependent insulinotropic polypeptide (GIP) receptor agonist (for example, Zepbound). Coverage cannot be authorized at this time.    Please be advised we currently do not have a Pharmacist to review denials, therefore you will need to process appeals accordingly as needed. Thanks for your support at this time.   You may call [phone] or fax [phone], to appeal.   Denial letter attached to chart

## 2022-10-03 NOTE — Telephone Encounter (Signed)
Pt has appt Monday. Will discuss with him then.

## 2022-10-06 ENCOUNTER — Other Ambulatory Visit: Payer: Self-pay

## 2022-10-06 ENCOUNTER — Ambulatory Visit: Payer: BC Managed Care – PPO | Admitting: Internal Medicine

## 2022-10-06 VITALS — BP 128/70 | HR 89 | Temp 98.0°F | Resp 16 | Ht 64.0 in | Wt 175.0 lb

## 2022-10-06 DIAGNOSIS — F419 Anxiety disorder, unspecified: Secondary | ICD-10-CM

## 2022-10-06 DIAGNOSIS — R0683 Snoring: Secondary | ICD-10-CM

## 2022-10-06 DIAGNOSIS — R739 Hyperglycemia, unspecified: Secondary | ICD-10-CM

## 2022-10-06 DIAGNOSIS — F1029 Alcohol dependence with unspecified alcohol-induced disorder: Secondary | ICD-10-CM | POA: Diagnosis not present

## 2022-10-06 DIAGNOSIS — R400 Somnolence: Secondary | ICD-10-CM

## 2022-10-06 DIAGNOSIS — F320 Major depressive disorder, single episode, mild: Secondary | ICD-10-CM

## 2022-10-06 DIAGNOSIS — D649 Anemia, unspecified: Secondary | ICD-10-CM

## 2022-10-06 DIAGNOSIS — K219 Gastro-esophageal reflux disease without esophagitis: Secondary | ICD-10-CM

## 2022-10-06 LAB — TSH: TSH: 0.56 u[IU]/mL (ref 0.35–5.50)

## 2022-10-06 LAB — BASIC METABOLIC PANEL
BUN: 13 mg/dL (ref 6–23)
CO2: 27 mEq/L (ref 19–32)
Calcium: 9.3 mg/dL (ref 8.4–10.5)
Chloride: 102 mEq/L (ref 96–112)
Creatinine, Ser: 0.92 mg/dL (ref 0.40–1.50)
GFR: 103.66 mL/min (ref >60.00)
Glucose, Bld: 88 mg/dL (ref 70–99)
Potassium: 4.1 mEq/L (ref 3.5–5.1)
Sodium: 139 mEq/L (ref 135–145)

## 2022-10-06 LAB — CBC WITH DIFFERENTIAL/PLATELET
Basophils Absolute: 0 10*3/uL (ref 0.0–0.1)
Basophils Relative: 1.1 % (ref 0.0–3.0)
Eosinophils Absolute: 0.2 10*3/uL (ref 0.0–0.7)
Eosinophils Relative: 3.6 % (ref 0.0–5.0)
HCT: 40.1 % (ref 39.0–52.0)
Hemoglobin: 13.8 g/dL (ref 13.0–17.0)
Lymphocytes Relative: 36.2 % (ref 12.0–46.0)
Lymphs Abs: 1.7 10*3/uL (ref 0.7–4.0)
MCHC: 34.4 g/dL (ref 30.0–36.0)
MCV: 93.6 fl (ref 78.0–100.0)
Monocytes Absolute: 0.4 10*3/uL (ref 0.1–1.0)
Monocytes Relative: 8.8 % (ref 3.0–12.0)
Neutro Abs: 2.3 10*3/uL (ref 1.4–7.7)
Neutrophils Relative %: 50.3 % (ref 43.0–77.0)
Platelets: 356 10*3/uL (ref 150.0–400.0)
RBC: 4.29 Mil/uL (ref 4.22–5.81)
RDW: 12.6 % (ref 11.5–15.5)
WBC: 4.6 10*3/uL (ref 4.0–10.5)

## 2022-10-06 LAB — LIPID PANEL
Cholesterol: 198 mg/dL (ref 0–200)
HDL: 74.6 mg/dL (ref >39.00)
LDL Cholesterol: 111 mg/dL — ABNORMAL HIGH (ref 0–99)
NonHDL: 123.24
Total CHOL/HDL Ratio: 3
Triglycerides: 60 mg/dL (ref 0.0–149.0)
VLDL: 12 mg/dL (ref 0.0–40.0)

## 2022-10-06 LAB — HEPATIC FUNCTION PANEL
ALT: 20 U/L (ref 0–53)
AST: 19 U/L (ref 0–37)
Albumin: 4.7 g/dL (ref 3.5–5.2)
Alkaline Phosphatase: 69 U/L (ref 39–117)
Bilirubin, Direct: 0.1 mg/dL (ref 0.0–0.3)
Total Bilirubin: 0.6 mg/dL (ref 0.2–1.2)
Total Protein: 7.5 g/dL (ref 6.0–8.3)

## 2022-10-06 LAB — HEMOGLOBIN A1C: Hgb A1c MFr Bld: 6 % (ref 4.6–6.5)

## 2022-10-06 MED ORDER — WEGOVY 0.25 MG/0.5ML ~~LOC~~ SOAJ
0.2500 mg | SUBCUTANEOUS | 3 refills | Status: DC
  Filled 2022-10-06 – 2022-11-14 (×3): qty 2, 28d supply, fill #0

## 2022-10-06 NOTE — Progress Notes (Signed)
Subjective:    Patient ID: Austin [name], male    DOB: 1980/08/23, 42 y.o.   MRN: [id number]  Patient here for  Chief Complaint  Patient presents with   Medical Management of Chronic Issues    HPI Here as a follow up appt.  Here to follow up regarding increased stress and depression.  Also concerned regarding weight gain.  Interested in [place].  Discussed diet and exercise.  He is walking his dog.  Is drinking more.  Has been on GLP - 1 agonist previously.  Did well.  Wanting to restart.  Reviewed BMI.  Reviewed possible side effects of the medication.  Discussed importance of diet and exercise, including decreased alcohol intake.  He feels the wegovy will help with this as well.  No SI.  Some fatigue.  Daytime somnolence.  Discussed possible sleep apnea.  Agreeable for further evaluation.     Past Medical History:  Diagnosis Date   Alcohol abuse    history of   Allergy    Anxiety    Depression    GERD (gastroesophageal reflux disease)    Heart murmur    Hypertension    Ulcer    history of bleeding ulcer requiring blood transfusions   Past Surgical History:  Procedure Laterality Date   COLONOSCOPY WITH PROPOFOL N/A 12/12/2019   Procedure: COLONOSCOPY WITH PROPOFOL;  Surgeon: Toledo, Boykin Nearing, MD;  Location: [facility] ENDOSCOPY;  Service: Gastroenterology;  Laterality: N/A;   ESOPHAGOGASTRODUODENOSCOPY (EGD) WITH PROPOFOL N/A 12/12/2019   Procedure: ESOPHAGOGASTRODUODENOSCOPY (EGD) WITH PROPOFOL;  Surgeon: Toledo, Boykin Nearing, MD;  Location: [facility] ENDOSCOPY;  Service: Gastroenterology;  Laterality: N/A;   KNEE SURGERY     Left knee - ACL   TONSILLECTOMY AND ADENOIDECTOMY  1992   Family History  Problem Relation Age of Onset   Alcohol abuse Father    Cancer Father        prostate cancer   Arthritis Sister    Cancer Maternal Grandmother        breast cancer   Stroke Maternal Grandmother    Diabetes Maternal Grandmother    Hypertension Maternal Grandmother    Social History    Socioeconomic History   Marital status: Married    Spouse name: Not on file   Number of children: Not on file   Years of education: Not on file   Highest education level: Not on file  Occupational History   Not on file  Tobacco Use   Smoking status: Former    Packs/day: .5    Types: Cigarettes   Smokeless tobacco: Never   Tobacco comments:    Quit smoking recently  Vaping Use   Vaping Use: Never used  Substance and Sexual Activity   Alcohol use: Yes    Alcohol/week: 2.0 standard drinks of alcohol    Types: 2 Cans of beer per week   Drug use: Yes    Types: Marijuana    Comment: marijuana   Sexual activity: Not on file  Other Topics Concern   Not on file  Social History Narrative   Not on file   Social Determinants of Health   Financial Resource Strain: Low Risk  (02/14/2021)   Overall Financial Resource Strain (CARDIA)    Difficulty of Paying Living Expenses: Not hard at all  Food Insecurity: Not on file  Transportation Needs: Not on file  Physical Activity: Not on file  Stress: Not on file  Social Connections: Not on file     Review  of Systems  Constitutional:  Positive for fatigue.       Concern regarding weight gain.   HENT:  Negative for congestion and sinus pressure.   Respiratory:  Negative for cough, chest tightness and shortness of breath.   Cardiovascular:  Negative for chest pain and palpitations.  Gastrointestinal:  Negative for abdominal pain, diarrhea, nausea and vomiting.  Genitourinary:  Negative for difficulty urinating and dysuria.  Musculoskeletal:  Negative for joint swelling and myalgias.  Skin:  Negative for color change and rash.  Neurological:  Negative for dizziness and headaches.  Psychiatric/Behavioral:  Negative for agitation and dysphoric mood.        Objective:     BP 128/70   Pulse 89   Temp 98 F (36.7 C)   Resp 16   Ht 5\' 4"  (1.626 m)   Wt 175 lb (79.4 kg)   SpO2 98%   BMI 30.04 kg/m  Wt Readings from Last 3  Encounters:  10/06/22 175 lb (79.4 kg)  04/22/22 175 lb 9.6 oz (79.7 kg)  03/25/22 175 lb 9.6 oz (79.7 kg)    Physical Exam Vitals reviewed.  Constitutional:      General: He is not in acute distress.    Appearance: Normal appearance. He is well-developed.  HENT:     Head: Normocephalic and atraumatic.     Right Ear: External ear normal.     Left Ear: External ear normal.  Eyes:     General: No scleral icterus.       Right eye: No discharge.        Left eye: No discharge.     Conjunctiva/sclera: Conjunctivae normal.  Cardiovascular:     Rate and Rhythm: Normal rate and regular rhythm.  Pulmonary:     Effort: Pulmonary effort is normal. No respiratory distress.     Breath sounds: Normal breath sounds.  Abdominal:     General: Bowel sounds are normal.     Palpations: Abdomen is soft.     Tenderness: There is no abdominal tenderness.  Musculoskeletal:        General: No swelling or tenderness.     Cervical back: Neck supple. No tenderness.  Lymphadenopathy:     Cervical: No cervical adenopathy.  Skin:    Findings: No erythema or rash.  Neurological:     Mental Status: He is alert.  Psychiatric:        Mood and Affect: Mood normal.        Behavior: Behavior normal.      Outpatient Encounter Medications as of 10/06/2022  Medication Sig   [DISCONTINUED] Semaglutide-Weight Management (WEGOVY) 0.25 MG/0.5ML SOAJ Inject 0.25 mg into the skin once a week.   acetaminophen (TYLENOL) 500 MG tablet Take 500 mg by mouth 2 (two) times daily as needed.   Ascorbic Acid (VITAMIN C) 100 MG tablet Take 100 mg by mouth daily.   cetirizine (ZYRTEC) 10 MG tablet Take 10 mg by mouth daily.   FLUoxetine (PROZAC) 10 MG capsule Take 1 capsule (10 mg total) by mouth daily.   pantoprazole (PROTONIX) 40 MG tablet Take 1 tablet (40 mg total) by mouth 2 (two) times daily.   Semaglutide-Weight Management (WEGOVY) 0.25 MG/0.5ML SOAJ Inject 0.25 mg into the skin once a week.   [DISCONTINUED]  dicyclomine (BENTYL) 10 MG capsule TAKE 1 CAPSULE BY MOUTH 4 TIMES DAILY BEFORE MEALS AND NIGHTLY   [DISCONTINUED] predniSONE (DELTASONE) 10 MG tablet Take 6 tablets x 1 day and then decrease by 1 tablet  per day until down to zero mg.   [DISCONTINUED] scopolamine (TRANSDERM-SCOP) 1 MG/3DAYS Place 1 patch (1.5 mg total) onto the skin every 3 (three) days.   No facility-administered encounter medications on file as of 10/06/2022.     Lab Results  Component Value Date   WBC 4.6 10/06/2022   HGB 13.8 10/06/2022   HCT 40.1 10/06/2022   PLT 356.0 10/06/2022   GLUCOSE 88 10/06/2022   CHOL 198 10/06/2022   TRIG 60.0 10/06/2022   HDL 74.60 10/06/2022   LDLCALC 111 (H) 10/06/2022   ALT 20 10/06/2022   AST 19 10/06/2022   NA 139 10/06/2022   K 4.1 10/06/2022   CL 102 10/06/2022   CREATININE 0.92 10/06/2022   BUN 13 10/06/2022   CO2 27 10/06/2022   TSH 0.56 10/06/2022   PSA 0.24 10/22/2021   HGBA1C 6.0 10/06/2022    No results found.     Assessment & Plan:  Hyperglycemia Assessment & Plan: Low carb diet and exercise.  Follow met b and a1c.  Request to restart wegovy.  BMI 30.  Will restart. Discussed possible side effects of medication.  Follow.   Orders: -     CBC with Differential/Platelet -     Basic metabolic panel -     Hepatic function panel -     Hemoglobin A1c -     Lipid panel -     TSH  Snoring Assessment & Plan: Has the snoring and daytime somnolence.  Also with witnessed apneic episodes. Refer to pulmonary for further evaluation - sleep apnea.   Orders: -     Ambulatory referral to Pulmonology  Alcohol dependence with unspecified alcohol-induced disorder Assessment & Plan: Still drinking.  Have discussed counseling, AA, etc.  He has declined.   Discussed need to decrease alcohol intake.  Working a second job.  Enjoys his second job.  Discussed weight loss and starting wegovy - with diet adjustment.  He feels this will help him decrease his alcohol intake.   Follow.     Major depressive disorder, single episode, mild Assessment & Plan: Continue on prozac.  Follow.    Anemia, unspecified type Assessment & Plan: S/p GI w/up 11/2019 - EGD and colonoscopy.  Follow cbc.    Anxiety Assessment & Plan: On prozac.  Stable. Follow.    Daytime somnolence Assessment & Plan: Discussed further evaluation - for sleep apnea.  Agreeable.  Refer to pulmonary.    Gastroesophageal reflux disease, unspecified whether esophagitis present Assessment & Plan: Continue protonix.  Follow.  EGD 11/2019.  No upper symptoms reported.    Other orders -     Wegovy; Inject 0.25 mg into the skin once a week.  Dispense: 2 mL; Refill: 3     Dale Durhamharlene Ayomide Purdy, MD

## 2022-10-10 ENCOUNTER — Other Ambulatory Visit: Payer: Self-pay

## 2022-10-10 MED FILL — Pantoprazole Sodium EC Tab 40 MG (Base Equiv): ORAL | 30 days supply | Qty: 60 | Fill #2 | Status: AC

## 2022-10-12 ENCOUNTER — Encounter: Payer: Self-pay | Admitting: Internal Medicine

## 2022-10-12 NOTE — Assessment & Plan Note (Signed)
Discussed further evaluation - for sleep apnea.  Agreeable.  Refer to pulmonary.

## 2022-10-12 NOTE — Assessment & Plan Note (Signed)
Continue protonix.  Follow.  EGD 11/2019.  No upper symptoms reported.  

## 2022-10-12 NOTE — Assessment & Plan Note (Signed)
On prozac.  Stable.  Follow.   

## 2022-10-12 NOTE — Assessment & Plan Note (Signed)
Low carb diet and exercise.  Follow met b and a1c.  Request to restart wegovy.  BMI 30.  Will restart. Discussed possible side effects of medication.  Follow.

## 2022-10-12 NOTE — Assessment & Plan Note (Signed)
S/p GI w/up 11/2019 - EGD and colonoscopy.  Follow cbc.  °

## 2022-10-12 NOTE — Assessment & Plan Note (Signed)
Has the snoring and daytime somnolence.  Also with witnessed apneic episodes. Refer to pulmonary for further evaluation - sleep apnea.

## 2022-10-12 NOTE — Assessment & Plan Note (Signed)
Still drinking.  Have discussed counseling, AA, etc.  He has declined.   Discussed need to decrease alcohol intake.  Working a second job.  Enjoys his second job.  Discussed weight loss and starting wegovy - with diet adjustment.  He feels this will help him decrease his alcohol intake.  Follow.

## 2022-10-12 NOTE — Assessment & Plan Note (Addendum)
Continue on prozac.  Follow.

## 2022-11-14 ENCOUNTER — Other Ambulatory Visit: Payer: Self-pay

## 2022-11-14 ENCOUNTER — Encounter: Payer: Self-pay | Admitting: Internal Medicine

## 2022-11-14 MED ORDER — FLUOXETINE HCL 10 MG PO CAPS
10.0000 mg | ORAL_CAPSULE | Freq: Every day | ORAL | 1 refills | Status: DC
  Filled 2022-11-14 (×2): qty 90, 90d supply, fill #0
  Filled 2023-01-11: qty 90, 90d supply, fill #1

## 2022-11-14 MED FILL — Pantoprazole Sodium EC Tab 40 MG (Base Equiv): ORAL | 30 days supply | Qty: 60 | Fill #3 | Status: CN

## 2022-11-14 MED FILL — Pantoprazole Sodium EC Tab 40 MG (Base Equiv): ORAL | 30 days supply | Qty: 60 | Fill #3 | Status: AC

## 2022-11-25 ENCOUNTER — Encounter: Payer: Self-pay | Admitting: Primary Care

## 2022-11-25 ENCOUNTER — Other Ambulatory Visit: Payer: Self-pay

## 2022-11-25 ENCOUNTER — Ambulatory Visit (INDEPENDENT_AMBULATORY_CARE_PROVIDER_SITE_OTHER): Payer: BC Managed Care – PPO | Admitting: Primary Care

## 2022-11-25 VITALS — BP 130/72 | HR 99 | Temp 98.0°F | Ht 64.0 in | Wt 175.0 lb

## 2022-11-25 DIAGNOSIS — R0683 Snoring: Secondary | ICD-10-CM

## 2022-11-25 DIAGNOSIS — R0981 Nasal congestion: Secondary | ICD-10-CM

## 2022-11-25 MED ORDER — FLONASE SENSIMIST 27.5 MCG/SPRAY NA SUSP
1.0000 | Freq: Every day | NASAL | 1 refills | Status: AC
  Filled 2022-11-25: qty 9.1, 60d supply, fill #0

## 2022-11-25 NOTE — Assessment & Plan Note (Addendum)
-   Continue Allegra 180mg  daily and diphenhydramine 50mg  at bedtime as needed - Recommend adding fluticasone sensimist and saline nasal rinses 1-2 times a day as needed for nasal congestion - Consider ENT referral after sleep study results

## 2022-11-25 NOTE — Assessment & Plan Note (Addendum)
-   Patient has symptoms of loud snoring and witnessed apnea.  Associated daytime sleepiness.  Epworth 5.  BMI 30.  Concern patient could have underlying obstructive sleep apnea, needs home sleep study evaluate.  We reviewed risks of untreated sleep apnea including cardiac arrhythmias, pulmonary hypertension, diabetes and stroke.  We also discussed treatment options including weight loss, oral appliance, CPAP therapy or referral to ENT for possible surgical options.  Encouraged side sleeping position or elevating head of bed 30 degrees with wedge pillow.  Advised against driving if experiencing excessive daytime sleepiness or fatigue.  Follow-up 1 to 2 weeks after sleep study to review results and treatment options if needed.

## 2022-11-25 NOTE — Patient Instructions (Addendum)
Recommendation: Try ocean saline nasal rinse 1-2 daily Use Flonase sensimist at bedtime (RX sent- if not covered you can get over the counter) Continue Allegra daily and as needed benadryl  Look into getting wedge pillow to elevate head while sleeping (don;t use night of sleep study)  Follow-up Call to scheduled follow-up to review sleep study results and treatment options 2 weeks after having sleep study (can be virtual)  Sleep Apnea Sleep apnea affects breathing during sleep. It causes breathing to stop for 10 seconds or more, or to become shallow. People with sleep apnea usually snore loudly. It can also increase the risk of: Heart attack. Stroke. Being very overweight (obese). Diabetes. Heart failure. Irregular heartbeat. High blood pressure. The goal of treatment is to help you breathe normally again. What are the causes?  The most common cause of this condition is a collapsed or blocked airway. There are three kinds of sleep apnea: Obstructive sleep apnea. This is caused by a blocked or collapsed airway. Central sleep apnea. This happens when the brain does not send the right signals to the muscles that control breathing. Mixed sleep apnea. This is a combination of obstructive and central sleep apnea. What increases the risk? Being overweight. Smoking. Having a small airway. Being older. Being male. Drinking alcohol. Taking medicines to calm yourself (sedatives or tranquilizers). Having family members with the condition. Having a tongue or tonsils that are larger than normal. What are the signs or symptoms? Trouble staying asleep. Loud snoring. Headaches in the morning. Waking up gasping. Dry mouth or sore throat in the morning. Being sleepy or tired during the day. If you are sleepy or tired during the day, you may also: Not be able to focus your mind (concentrate). Forget things. Get angry a lot and have mood swings. Feel sad (depressed). Have changes in your  personality. Have less interest in sex, if you are male. Be unable to have an erection, if you are male. How is this treated?  Sleeping on your side. Using a medicine to get rid of mucus in your nose (decongestant). Avoiding the use of alcohol, medicines to help you relax, or certain pain medicines (narcotics). Losing weight, if needed. Changing your diet. Quitting smoking. Using a machine to open your airway while you sleep, such as: An oral appliance. This is a mouthpiece that shifts your lower jaw forward. A CPAP device. This device blows air through a mask when you breathe out (exhale). An EPAP device. This has valves that you put in each nostril. A BIPAP device. This device blows air through a mask when you breathe in (inhale) and breathe out. Having surgery if other treatments do not work. Follow these instructions at home: Lifestyle Make changes that your doctor recommends. Eat a healthy diet. Lose weight if needed. Avoid alcohol, medicines to help you relax, and some pain medicines. Do not smoke or use any products that contain nicotine or tobacco. If you need help quitting, ask your doctor. General instructions Take over-the-counter and prescription medicines only as told by your doctor. If you were given a machine to use while you sleep, use it only as told by your doctor. If you are having surgery, make sure to tell your doctor you have sleep apnea. You may need to bring your device with you. Keep all follow-up visits. Contact a doctor if: The machine that you were given to use during sleep bothers you or does not seem to be working. You do not get better. You get  worse. Get help right away if: Your chest hurts. You have trouble breathing in enough air. You have an uncomfortable feeling in your back, arms, or stomach. You have trouble talking. One side of your body feels weak. A part of your face is hanging down. These symptoms may be an emergency. Get help right  away. Call your local emergency services (911 in the [place] Do not wait to see if the symptoms will go away. Do not drive yourself to the hospital. Summary This condition affects breathing during sleep. The most common cause is a collapsed or blocked airway. The goal of treatment is to help you breathe normally while you sleep. This information is not intended to replace advice given to you by your health care provider. Make sure you discuss any questions you have with your health care provider. Document Revised: 01/23/2021 Document Reviewed: 05/25/2020 Elsevier Patient Education  2024 [employer].

## 2022-11-25 NOTE — Progress Notes (Signed)
@Patient  ID: Austin [name], male    DOB: 1981-03-17, 42 y.o.   MRN: [id number]  Chief Complaint  Patient presents with   pulmonary consult    Prior sleep study. C/o loud snoring, restless sleep and witnessed apneas.     Referring provider: Dale [place], MD  HPI: 42 year old male, former smoker. Past medical history significant for GERD, daytime somnolence, snoring, environmental allergies, alcohol abuse, major depressive disorder.   11/25/2022 Patient presents today for sleep consult. He has been told that he snores and wife has witnessed him stop breathing at night. He will occasionally wake up with a sore throat. He tosses and turns throughout the night. He is unable to stay asleep, has trouble falling back to sleep when he wakes up. He does not feel he is getting restful sleep. He is tired when he wakes up in the morning. He has a lot of nasal congestion, wakes up congested every morning. He takes allegra in the morning and benadryl at night 2-3 times a week. He has tried some nasal sprays bur feels they are irritating to his throat. He has tried breath right strips with improvement. No know nasal trauma. He played rugby, soccer and wrestled. He sleep on his back. Typical bedtime is 9:30-10:30pm. It takes 30 mins to fall asleep. He wakes up 4 times a night. He starts his day at 5:45am. No previous sleep studies. He does not wear CPAP. No concern for narcolepsy, cataplexy or sleepwalking.   Sleep questionnaire: Symptoms- Snoring, witnessed apnea, restless sleep, non-restorative sleep  Previous sleep studies- > 10 years ago (2013 [facility]) Bedtime- 9:30-10:30 pm  Time to fall asleep- 30 mins  Nocturnal awakenings- four times  Start of day- 5:45am  Do you operate heavy machinery- no Do you wear CPAP or oxygen- no Weight changes- stable  Epworth score-5 Medications- prn diphenhydramine for allergies   Social Hx: Patient is married with children.  Lives with his  wife and kids.  Works at [employer].  Drinks daily.  He does smoke marijuana.  Allergies  Allergen Reactions   Sulfa Antibiotics     Immunization History  Administered Date(s) Administered   Influenza,inj,Quad PF,6+ Mos 04/07/2014, 04/09/2015, 05/15/2017, 04/02/2018, 04/26/2019, 04/27/2020, 03/25/2021, 02/21/2022   Influenza-Unspecified 05/30/2010   PFIZER(Purple Top)SARS-COV-2 Vaccination 02/22/2020, 03/23/2020   Tdap 07/13/2014    Past Medical History:  Diagnosis Date   Alcohol abuse    history of   Allergy    Anxiety    Depression    GERD (gastroesophageal reflux disease)    Heart murmur    Hypertension    Ulcer    history of bleeding ulcer requiring blood transfusions    Tobacco History: Social History   Tobacco Use  Smoking Status Former   Packs/day: 0.50   Years: 20.00   Additional pack years: 0.00   Total pack years: 10.00   Types: Cigarettes   Quit date: 2019   Years since quitting: 5.4  Smokeless Tobacco Never  Tobacco Comments   Quit smoking recently   Counseling given: Not Answered Tobacco comments: Quit smoking recently   Outpatient Medications Prior to Visit  Medication Sig Dispense Refill   acetaminophen (TYLENOL) 500 MG tablet Take 500 mg by mouth 2 (two) times daily as needed.     Ascorbic Acid (VITAMIN C) 100 MG tablet Take 100 mg by mouth daily.     cetirizine (ZYRTEC) 10 MG tablet Take 10 mg by mouth daily.     FLUoxetine (  PROZAC) 10 MG capsule Take 1 capsule (10 mg total) by mouth daily. 90 capsule 1   pantoprazole (PROTONIX) 40 MG tablet Take 1 tablet (40 mg total) by mouth 2 (two) times daily. 60 tablet 11   Semaglutide-Weight Management (WEGOVY) 0.25 MG/0.5ML SOAJ Inject 0.25 mg into the skin once a week. (Patient not taking: Reported on 11/25/2022) 2 mL 3   No facility-administered medications prior to visit.      Review of Systems  Review of Systems  Constitutional:  Positive for fatigue.  Respiratory: Negative.     Cardiovascular: Negative.   Psychiatric/Behavioral:  Positive for sleep disturbance.    Physical Exam  BP 130/72 (BP Location: Left Arm, Cuff Size: Normal)   Pulse 99   Temp 98 F (36.7 C) (Temporal)   Ht 5\' 4"  (1.626 m)   Wt 175 lb (79.4 kg)   SpO2 98%   BMI 30.04 kg/m  Physical Exam Constitutional:      General: He is not in acute distress.    Appearance: Normal appearance. He is not ill-appearing.  HENT:     Head: Normocephalic and atraumatic.     Mouth/Throat:     Mouth: Mucous membranes are moist.     Pharynx: Oropharynx is clear.  Cardiovascular:     Rate and Rhythm: Normal rate and regular rhythm.  Pulmonary:     Effort: Pulmonary effort is normal.     Breath sounds: Normal breath sounds.  Skin:    General: Skin is warm and dry.  Neurological:     General: No focal deficit present.     Mental Status: He is alert and oriented to person, place, and time. Mental status is at baseline.  Psychiatric:        Mood and Affect: Mood normal.        Behavior: Behavior normal.        Thought Content: Thought content normal.        Judgment: Judgment normal.      Lab Results:  CBC    Component Value Date/Time   WBC 4.6 10/06/2022 0753   RBC 4.29 10/06/2022 0753   HGB 13.8 10/06/2022 0753   HGB 14.2 02/02/2018 0841   HCT 40.1 10/06/2022 0753   HCT 41.3 02/02/2018 0841   PLT 356.0 10/06/2022 0753   PLT 324 02/02/2018 0841   MCV 93.6 10/06/2022 0753   MCV 87 02/02/2018 0841   MCH 30.0 01/14/2019 0831   MCHC 34.4 10/06/2022 0753   RDW 12.6 10/06/2022 0753   RDW 14.2 02/02/2018 0841   LYMPHSABS 1.7 10/06/2022 0753   LYMPHSABS 2.3 02/02/2018 0841   MONOABS 0.4 10/06/2022 0753   EOSABS 0.2 10/06/2022 0753   EOSABS 0.3 02/02/2018 0841   BASOSABS 0.0 10/06/2022 0753   BASOSABS 0.0 02/02/2018 0841    BMET    Component Value Date/Time   NA 139 10/06/2022 0753   NA 141 02/02/2018 0841   K 4.1 10/06/2022 0753   CL 102 10/06/2022 0753   CO2 27 10/06/2022 0753    GLUCOSE 88 10/06/2022 0753   BUN 13 10/06/2022 0753   BUN 9 02/02/2018 0841   CREATININE 0.92 10/06/2022 0753   CALCIUM 9.3 10/06/2022 0753   GFRNONAA >60 01/14/2019 0833   GFRAA >60 01/14/2019 0833    BNP No results found for: "BNP"  ProBNP No results found for: "PROBNP"  Imaging: No results found.   Assessment & Plan:   Loud snoring - Patient has symptoms of loud snoring and  witnessed apnea.  Associated daytime sleepiness.  Her score 5.  BMI 30.  Concern patient could have underlying obstructive sleep apnea, needs home sleep study evaluate.  We reviewed risks of untreated sleep apnea including cardiac arrhythmias, pulmonary hypertension, diabetes and stroke.  We also discussed treatment options including weight loss, oral appliance, CPAP therapy or referral to ENT for possible surgical options.  Encouraged side sleeping position or elevating head of bed 30 degrees with wedge pillow.  Advised against driving if experiencing excessive daytime sleepiness or fatigue.  Follow-up 1 to 2 weeks after sleep study to review results and treatment options if needed.  Chronic nasal congestion - Continue Allegra 180mg  daily and diphenhydramine 50mg  at bedtime as needed - Recommend adding fluticasone sensimist and saline nasal rinses 1-2 times a day as needed for nasal congestion - Consider ENT referral after sleep study results  Glenford Bayley, NP 11/25/2022

## 2022-11-26 NOTE — Progress Notes (Signed)
Reviewed and agree with assessment/plan.   Coralyn Helling, MD [facility] Pulmonary/Critical Care 11/26/2022, 8:11 AM Pager:  [phone]

## 2022-11-28 ENCOUNTER — Other Ambulatory Visit: Payer: Self-pay

## 2023-01-11 MED FILL — Pantoprazole Sodium EC Tab 40 MG (Base Equiv): ORAL | 30 days supply | Qty: 60 | Fill #4 | Status: AC

## 2023-01-12 ENCOUNTER — Other Ambulatory Visit: Payer: Self-pay

## 2023-01-12 ENCOUNTER — Encounter: Payer: BC Managed Care – PPO | Admitting: Internal Medicine

## 2023-01-12 ENCOUNTER — Ambulatory Visit (INDEPENDENT_AMBULATORY_CARE_PROVIDER_SITE_OTHER): Payer: BC Managed Care – PPO | Admitting: Primary Care

## 2023-01-12 DIAGNOSIS — G4733 Obstructive sleep apnea (adult) (pediatric): Secondary | ICD-10-CM

## 2023-01-12 DIAGNOSIS — R0683 Snoring: Secondary | ICD-10-CM

## 2023-02-09 ENCOUNTER — Other Ambulatory Visit: Payer: Self-pay

## 2023-02-09 ENCOUNTER — Ambulatory Visit (INDEPENDENT_AMBULATORY_CARE_PROVIDER_SITE_OTHER): Payer: BC Managed Care – PPO | Admitting: Internal Medicine

## 2023-02-09 ENCOUNTER — Encounter: Payer: Self-pay | Admitting: Internal Medicine

## 2023-02-09 VITALS — BP 126/74 | HR 88 | Temp 97.9°F | Resp 16 | Ht 64.0 in | Wt 173.6 lb

## 2023-02-09 DIAGNOSIS — G4730 Sleep apnea, unspecified: Secondary | ICD-10-CM

## 2023-02-09 DIAGNOSIS — F1029 Alcohol dependence with unspecified alcohol-induced disorder: Secondary | ICD-10-CM

## 2023-02-09 DIAGNOSIS — F320 Major depressive disorder, single episode, mild: Secondary | ICD-10-CM | POA: Diagnosis not present

## 2023-02-09 DIAGNOSIS — K219 Gastro-esophageal reflux disease without esophagitis: Secondary | ICD-10-CM

## 2023-02-09 DIAGNOSIS — Z0000 Encounter for general adult medical examination without abnormal findings: Secondary | ICD-10-CM | POA: Diagnosis not present

## 2023-02-09 DIAGNOSIS — D649 Anemia, unspecified: Secondary | ICD-10-CM

## 2023-02-09 DIAGNOSIS — R739 Hyperglycemia, unspecified: Secondary | ICD-10-CM

## 2023-02-09 DIAGNOSIS — Z125 Encounter for screening for malignant neoplasm of prostate: Secondary | ICD-10-CM

## 2023-02-09 MED ORDER — FLUOXETINE HCL 10 MG PO CAPS
10.0000 mg | ORAL_CAPSULE | Freq: Every day | ORAL | 1 refills | Status: DC
  Filled 2023-02-09: qty 90, 90d supply, fill #0

## 2023-02-09 NOTE — Progress Notes (Signed)
Subjective:    Patient ID: Austin [name], male    DOB: [birth year], 42 y.o.   MRN: [id number]  Patient here for  Chief Complaint  Patient presents with   Medical Management of Chronic Issues    HPI Here for physical exam.  Last visit - started wegovy. Saw pulmonary - evaluation for sleep apnea.  Recommended HST. Waiting for results.  Just found out he was losing his job.  Discussed.  Increased stress related to this.  Has increased his alcohol intake - related to the increased stress.  Discussed AA.  Discussed counseling.  He declines.  Discussed the need to decrease alcohol intake.  No chest pain or sob reported.  No abdominal pain or bowel change reported.     Past Medical History:  Diagnosis Date   Alcohol abuse    history of   Allergy    Anxiety    Depression    GERD (gastroesophageal reflux disease)    Heart murmur    Hypertension    Ulcer    history of bleeding ulcer requiring blood transfusions   Past Surgical History:  Procedure Laterality Date   COLONOSCOPY WITH PROPOFOL N/A 12/12/2019   Procedure: COLONOSCOPY WITH PROPOFOL;  Surgeon: Toledo, Boykin Nearing, MD;  Location: [facility];  Service: Gastroenterology;  Laterality: N/A;   ESOPHAGOGASTRODUODENOSCOPY (EGD) WITH PROPOFOL N/A 12/12/2019   Procedure: ESOPHAGOGASTRODUODENOSCOPY (EGD) WITH PROPOFOL;  Surgeon: Toledo, Boykin Nearing, MD;  Location: [facility];  Service: Gastroenterology;  Laterality: N/A;   KNEE SURGERY     Left knee - ACL   TONSILLECTOMY AND ADENOIDECTOMY  1992   Family History  Problem Relation Age of Onset   Alcohol abuse Father    Cancer Father        prostate cancer   Arthritis Sister    Cancer Maternal Grandmother        breast cancer   Stroke Maternal Grandmother    Diabetes Maternal Grandmother    Hypertension Maternal Grandmother    Social History   Socioeconomic History   Marital status: Married    Spouse name: Not on file   Number of children: Not on file   Years of education:  Not on file   Highest education level: Not on file  Occupational History   Not on file  Tobacco Use   Smoking status: Former    Current packs/day: 0.00    Average packs/day: 0.5 packs/day for 20.0 years (10.0 ttl pk-yrs)    Types: Cigarettes    Start date: 70    Quit date: 2019    Years since quitting: 5.6   Smokeless tobacco: Never   Tobacco comments:    Quit smoking recently  Vaping Use   Vaping status: Never Used  Substance and Sexual Activity   Alcohol use: Yes    Alcohol/week: 2.0 standard drinks of alcohol    Types: 2 Cans of beer per week   Drug use: Yes    Types: Marijuana    Comment: marijuana   Sexual activity: Not on file  Other Topics Concern   Not on file  Social History Narrative   Not on file   Social Determinants of Health   Financial Resource Strain: Low Risk  (02/14/2021)   Overall Financial Resource Strain (CARDIA)    Difficulty of Paying Living Expenses: Not hard at all  Food Insecurity: Not on file  Transportation Needs: Not on file  Physical Activity: Not on file  Stress: Not on file  Social Connections:  Not on file     Review of Systems  Constitutional:  Negative for appetite change and unexpected weight change.  HENT:  Negative for congestion, sinus pressure and sore throat.   Eyes:  Negative for pain and visual disturbance.  Respiratory:  Negative for cough, chest tightness and shortness of breath.   Cardiovascular:  Negative for chest pain and palpitations.       No increased swelling.   Gastrointestinal:  Negative for abdominal pain, diarrhea, nausea and vomiting.  Genitourinary:  Negative for difficulty urinating and dysuria.  Musculoskeletal:  Negative for joint swelling and myalgias.  Skin:  Negative for color change and rash.  Neurological:  Negative for dizziness and headaches.  Hematological:  Negative for adenopathy. Does not bruise/bleed easily.  Psychiatric/Behavioral:  Negative for agitation and dysphoric mood.         Objective:     BP 126/74   Pulse 88   Temp 97.9 F (36.6 C)   Resp 16   Ht 5\' 4"  (1.626 m)   Wt 173 lb 9.6 oz (78.7 kg)   SpO2 99%   BMI 29.80 kg/m  Wt Readings from Last 3 Encounters:  02/09/23 173 lb 9.6 oz (78.7 kg)  11/25/22 175 lb (79.4 kg)  10/06/22 175 lb (79.4 kg)    Physical Exam Constitutional:      General: He is not in acute distress.    Appearance: Normal appearance. He is well-developed.  HENT:     Head: Normocephalic and atraumatic.     Right Ear: External ear normal.     Left Ear: External ear normal.  Eyes:     General: No scleral icterus.       Right eye: No discharge.        Left eye: No discharge.     Conjunctiva/sclera: Conjunctivae normal.  Neck:     Thyroid: No thyromegaly.  Cardiovascular:     Rate and Rhythm: Normal rate and regular rhythm.  Pulmonary:     Effort: No respiratory distress.     Breath sounds: Normal breath sounds. No wheezing.  Abdominal:     General: Bowel sounds are normal.     Palpations: Abdomen is soft.     Tenderness: There is no abdominal tenderness.  Musculoskeletal:        General: No swelling or tenderness.     Cervical back: Neck supple. No tenderness.  Lymphadenopathy:     Cervical: No cervical adenopathy.  Skin:    Findings: No erythema or rash.  Neurological:     Mental Status: He is alert and oriented to person, place, and time.  Psychiatric:        Mood and Affect: Mood normal.        Behavior: Behavior normal.      Outpatient Encounter Medications as of 02/09/2023  Medication Sig   acetaminophen (TYLENOL) 500 MG tablet Take 500 mg by mouth 2 (two) times daily as needed.   Ascorbic Acid (VITAMIN C) 100 MG tablet Take 100 mg by mouth daily.   cetirizine (ZYRTEC) 10 MG tablet Take 10 mg by mouth daily.   fluticasone (FLONASE SENSIMIST) 27.5 MCG/SPRAY nasal spray Place 1 spray into the nose daily.   pantoprazole (PROTONIX) 40 MG tablet Take 1 tablet (40 mg total) by mouth 2 (two) times daily.    [DISCONTINUED] FLUoxetine (PROZAC) 10 MG capsule Take 1 capsule (10 mg total) by mouth daily.   FLUoxetine (PROZAC) 10 MG capsule Take 1 capsule (10 mg total) by  mouth daily.   [DISCONTINUED] Semaglutide-Weight Management (WEGOVY) 0.25 MG/0.5ML SOAJ Inject 0.25 mg into the skin once a week. (Patient not taking: Reported on 11/25/2022)   No facility-administered encounter medications on file as of 02/09/2023.     Lab Results  Component Value Date   WBC 4.6 10/06/2022   HGB 13.8 10/06/2022   HCT 40.1 10/06/2022   PLT 356.0 10/06/2022   GLUCOSE 88 10/06/2022   CHOL 198 10/06/2022   TRIG 60.0 10/06/2022   HDL 74.60 10/06/2022   LDLCALC 111 (H) 10/06/2022   ALT 20 10/06/2022   AST 19 10/06/2022   NA 139 10/06/2022   K 4.1 10/06/2022   CL 102 10/06/2022   CREATININE 0.92 10/06/2022   BUN 13 10/06/2022   CO2 27 10/06/2022   TSH 0.56 10/06/2022   PSA 0.24 10/22/2021   HGBA1C 6.0 10/06/2022    No results found.     Assessment & Plan:  Hyperglycemia Assessment & Plan: Low carb diet and exercise.  Follow met b and a1c.    Orders: -     Basic metabolic panel; Future -     Hemoglobin A1c; Future -     Hepatic function panel; Future -     Lipid panel; Future  Major depressive disorder, single episode, mild (HCC) Assessment & Plan: Continue on prozac.  Discussed. Declines counseling.  No SI. Follow.    Alcohol dependence with unspecified alcohol-induced disorder [facility]) Assessment & Plan: Drinking has increased as outlined.  Discussed counseling, AA, etc.  He declines.   Discussed need to decrease alcohol intake.     Anemia, unspecified type Assessment & Plan: S/p GI w/up 11/2019 - EGD and colonoscopy.  Follow cbc.    Gastroesophageal reflux disease, unspecified whether esophagitis present Assessment & Plan: Continue protonix.  Follow.  EGD 11/2019.  No upper symptoms reported.    Health care maintenance Assessment & Plan: Physical today 02/09/23.  Colonoscopy 11/2019 -  normal (Dr Norma Fredrickson). Check psa with next labs.    Sleep apnea, unspecified type Assessment & Plan: Recent sleep study - sleep apnea.  Needs f/u with pulmonary to discuss.     Routine general medical examination at a health care facility  Prostate cancer screening -     PSA; Future  Other orders -     FLUoxetine HCl; Take 1 capsule (10 mg total) by mouth daily.  Dispense: 90 capsule; Refill: 1     Dale [place], MD

## 2023-02-10 NOTE — Progress Notes (Signed)
Can you please call patient and let him know that home sleep study showed mild OSA, average of 6.5 apneas per hour. He had symptoms of snoring, witness apnea and sleep disruption. Treatment options are either seeing orthodontics for oral appliance, CPAP or referral to ENT for possible surgical options. Let me know if patient is interested in either of these, if wants to discuss more set up virtual visit on Friday afternoons

## 2023-02-15 ENCOUNTER — Encounter: Payer: Self-pay | Admitting: Internal Medicine

## 2023-02-15 DIAGNOSIS — G4730 Sleep apnea, unspecified: Secondary | ICD-10-CM | POA: Insufficient documentation

## 2023-02-15 NOTE — Assessment & Plan Note (Signed)
S/p GI w/up 11/2019 - EGD and colonoscopy.  Follow cbc.

## 2023-02-15 NOTE — Assessment & Plan Note (Signed)
Continue on prozac.  Discussed. Declines counseling.  No SI. Follow.

## 2023-02-15 NOTE — Assessment & Plan Note (Signed)
Continue protonix.  Follow.  EGD 11/2019.  No upper symptoms reported.  

## 2023-02-15 NOTE — Assessment & Plan Note (Signed)
Physical today 02/09/23.  Colonoscopy 11/2019 - normal (Dr Norma Fredrickson). Check psa with next labs.

## 2023-02-15 NOTE — Assessment & Plan Note (Signed)
Recent sleep study - sleep apnea.  Needs f/u with pulmonary to discuss.

## 2023-02-15 NOTE — Assessment & Plan Note (Signed)
Drinking has increased as outlined.  Discussed counseling, AA, etc.  He declines.   Discussed need to decrease alcohol intake.

## 2023-02-15 NOTE — Assessment & Plan Note (Signed)
Low carb diet and exercise.  Follow met b and a1c.   

## 2023-02-16 DIAGNOSIS — G4733 Obstructive sleep apnea (adult) (pediatric): Secondary | ICD-10-CM

## 2023-02-20 ENCOUNTER — Other Ambulatory Visit: Payer: BC Managed Care – PPO

## 2023-02-24 ENCOUNTER — Other Ambulatory Visit: Payer: Self-pay

## 2023-02-24 MED FILL — Pantoprazole Sodium EC Tab 40 MG (Base Equiv): ORAL | 30 days supply | Qty: 60 | Fill #5 | Status: AC

## 2023-04-16 MED FILL — Pantoprazole Sodium EC Tab 40 MG (Base Equiv): ORAL | 30 days supply | Qty: 60 | Fill #6 | Status: AC

## 2023-04-17 NOTE — Progress Notes (Signed)
Can we follow up on this, make sure he got message/saw results.

## 2023-05-14 ENCOUNTER — Ambulatory Visit: Payer: BC Managed Care – PPO | Admitting: Internal Medicine

## 2023-05-14 ENCOUNTER — Other Ambulatory Visit: Payer: Self-pay

## 2023-05-14 VITALS — BP 118/70 | HR 73 | Temp 98.2°F | Resp 16 | Ht 65.0 in | Wt 181.8 lb

## 2023-05-14 DIAGNOSIS — F1029 Alcohol dependence with unspecified alcohol-induced disorder: Secondary | ICD-10-CM | POA: Diagnosis not present

## 2023-05-14 DIAGNOSIS — G4730 Sleep apnea, unspecified: Secondary | ICD-10-CM

## 2023-05-14 DIAGNOSIS — D649 Anemia, unspecified: Secondary | ICD-10-CM

## 2023-05-14 DIAGNOSIS — R739 Hyperglycemia, unspecified: Secondary | ICD-10-CM

## 2023-05-14 DIAGNOSIS — F419 Anxiety disorder, unspecified: Secondary | ICD-10-CM | POA: Diagnosis not present

## 2023-05-14 DIAGNOSIS — K259 Gastric ulcer, unspecified as acute or chronic, without hemorrhage or perforation: Secondary | ICD-10-CM

## 2023-05-14 DIAGNOSIS — K219 Gastro-esophageal reflux disease without esophagitis: Secondary | ICD-10-CM

## 2023-05-14 DIAGNOSIS — F320 Major depressive disorder, single episode, mild: Secondary | ICD-10-CM

## 2023-05-14 MED ORDER — FLUOXETINE HCL 10 MG PO CAPS
10.0000 mg | ORAL_CAPSULE | Freq: Every day | ORAL | 1 refills | Status: DC
  Filled 2023-05-14: qty 90, 90d supply, fill #0

## 2023-05-14 NOTE — Progress Notes (Signed)
Subjective:    Patient ID: Austin [name], male    DOB: 16-Sep-1980, 42 y.o.   MRN: [id number]  Patient here for  Chief Complaint  Patient presents with   Medical Management of Chronic Issues    HPI Here for a scheduled follow up - f/u regarding increased stress/depression, GERD and sleep apnea. Sleep study revealed mild OSA.  Discussed.  He is interested in checking into oral device. Information given.  Has a new job. Going well. Walking/exercising more.  No chest pain or sob reported.  No abdominal pain or bowel change reported. Still drinking. Discussed the need to decrease/stop.    Past Medical History:  Diagnosis Date   Alcohol abuse    history of   Allergy    Anxiety    Depression    GERD (gastroesophageal reflux disease)    Heart murmur    Hypertension    Ulcer    history of bleeding ulcer requiring blood transfusions   Past Surgical History:  Procedure Laterality Date   COLONOSCOPY WITH PROPOFOL N/A 12/12/2019   Procedure: COLONOSCOPY WITH PROPOFOL;  Surgeon: Toledo, Boykin Nearing, MD;  Location: [facility];  Service: Gastroenterology;  Laterality: N/A;   ESOPHAGOGASTRODUODENOSCOPY (EGD) WITH PROPOFOL N/A 12/12/2019   Procedure: ESOPHAGOGASTRODUODENOSCOPY (EGD) WITH PROPOFOL;  Surgeon: Toledo, Boykin Nearing, MD;  Location: [facility];  Service: Gastroenterology;  Laterality: N/A;   KNEE SURGERY     Left knee - ACL   TONSILLECTOMY AND ADENOIDECTOMY  1992   Family History  Problem Relation Age of Onset   Alcohol abuse Father    Cancer Father        prostate cancer   Arthritis Sister    Cancer Maternal Grandmother        breast cancer   Stroke Maternal Grandmother    Diabetes Maternal Grandmother    Hypertension Maternal Grandmother    Social History   Socioeconomic History   Marital status: Married    Spouse name: Not on file   Number of children: Not on file   Years of education: Not on file   Highest education level: Not on file  Occupational History    Not on file  Tobacco Use   Smoking status: Former    Current packs/day: 0.00    Average packs/day: 0.5 packs/day for 20.0 years (10.0 ttl pk-yrs)    Types: Cigarettes    Start date: 8    Quit date: 2019    Years since quitting: 5.8   Smokeless tobacco: Never   Tobacco comments:    Quit smoking recently  Vaping Use   Vaping status: Never Used  Substance and Sexual Activity   Alcohol use: Yes    Alcohol/week: 2.0 standard drinks of alcohol    Types: 2 Cans of beer per week   Drug use: Yes    Types: Marijuana    Comment: marijuana   Sexual activity: Not on file  Other Topics Concern   Not on file  Social History Narrative   Not on file   Social Determinants of Health   Financial Resource Strain: Low Risk  (02/14/2021)   Overall Financial Resource Strain (CARDIA)    Difficulty of Paying Living Expenses: Not hard at all  Food Insecurity: Not on file  Transportation Needs: Not on file  Physical Activity: Not on file  Stress: Not on file  Social Connections: Not on file     Review of Systems  Constitutional:  Negative for appetite change and unexpected weight change.  HENT:  Negative for congestion and sinus pressure.   Respiratory:  Negative for cough, chest tightness and shortness of breath.   Cardiovascular:  Negative for chest pain and palpitations.  Gastrointestinal:  Negative for abdominal pain, diarrhea, nausea and vomiting.  Genitourinary:  Negative for difficulty urinating and dysuria.  Musculoskeletal:  Negative for joint swelling and myalgias.  Skin:  Negative for color change and rash.  Neurological:  Negative for dizziness and headaches.  Psychiatric/Behavioral:  Negative for agitation and dysphoric mood.        Objective:     BP 118/70   Pulse 73   Temp 98.2 F (36.8 C)   Resp 16   Ht 5\' 5"  (1.651 m)   Wt 181 lb 12.8 oz (82.5 kg)   SpO2 99%   BMI 30.25 kg/m  Wt Readings from Last 3 Encounters:  05/14/23 181 lb 12.8 oz (82.5 kg)  02/09/23  173 lb 9.6 oz (78.7 kg)  11/25/22 175 lb (79.4 kg)    Physical Exam Constitutional:      General: He is not in acute distress.    Appearance: Normal appearance. He is well-developed.  HENT:     Head: Normocephalic and atraumatic.     Right Ear: External ear normal.     Left Ear: External ear normal.  Eyes:     General: No scleral icterus.       Right eye: No discharge.        Left eye: No discharge.  Cardiovascular:     Rate and Rhythm: Normal rate and regular rhythm.  Pulmonary:     Effort: Pulmonary effort is normal. No respiratory distress.     Breath sounds: Normal breath sounds.  Abdominal:     General: Bowel sounds are normal.     Palpations: Abdomen is soft.     Tenderness: There is no abdominal tenderness.  Musculoskeletal:        General: No swelling or tenderness.     Cervical back: Neck supple. No tenderness.  Lymphadenopathy:     Cervical: No cervical adenopathy.  Skin:    Findings: No erythema or rash.  Neurological:     Mental Status: He is alert.  Psychiatric:        Mood and Affect: Mood normal.        Behavior: Behavior normal.      Outpatient Encounter Medications as of 05/14/2023  Medication Sig   acetaminophen (TYLENOL) 500 MG tablet Take 500 mg by mouth 2 (two) times daily as needed.   Ascorbic Acid (VITAMIN C) 100 MG tablet Take 100 mg by mouth daily.   cetirizine (ZYRTEC) 10 MG tablet Take 10 mg by mouth daily.   fluticasone (FLONASE SENSIMIST) 27.5 MCG/SPRAY nasal spray Place 1 spray into the nose daily.   pantoprazole (PROTONIX) 40 MG tablet Take 1 tablet (40 mg total) by mouth 2 (two) times daily.   [DISCONTINUED] FLUoxetine (PROZAC) 10 MG capsule Take 1 capsule (10 mg total) by mouth daily.   FLUoxetine (PROZAC) 10 MG capsule Take 1 capsule (10 mg total) by mouth daily.   No facility-administered encounter medications on file as of 05/14/2023.     Lab Results  Component Value Date   WBC 4.6 10/06/2022   HGB 13.8 10/06/2022   HCT  40.1 10/06/2022   PLT 356.0 10/06/2022   GLUCOSE 88 10/06/2022   CHOL 198 10/06/2022   TRIG 60.0 10/06/2022   HDL 74.60 10/06/2022   LDLCALC 111 (H) 10/06/2022   ALT  20 10/06/2022   AST 19 10/06/2022   NA 139 10/06/2022   K 4.1 10/06/2022   CL 102 10/06/2022   CREATININE 0.92 10/06/2022   BUN 13 10/06/2022   CO2 27 10/06/2022   TSH 0.56 10/06/2022   PSA 0.24 10/22/2021   HGBA1C 6.0 10/06/2022       Assessment & Plan:  Hyperglycemia Assessment & Plan: Low carb diet and exercise.  Follow met b and a1c.     Alcohol dependence with unspecified alcohol-induced disorder [facility]) Assessment & Plan: Drinking has increased as outlined.  Discussed counseling, AA, etc.  He declines.   Discussed need to decrease alcohol intake.     Anemia, unspecified type Assessment & Plan: S/p GI w/up 11/2019 - EGD and colonoscopy.  Follow cbc.    Anxiety Assessment & Plan: On prozac.  Stable. Follow.    Gastric ulcer without hemorrhage or perforation, unspecified chronicity Assessment & Plan: Has a history of gastric ulcer.  On protonix.  Asymptomatic.    Gastroesophageal reflux disease, unspecified whether esophagitis present Assessment & Plan: Continue protonix.  Follow.  EGD 11/2019.  No upper symptoms reported.    Major depressive disorder, single episode, mild (HCC) Assessment & Plan: Continue on prozac.  Discussed. Declines counseling.  No SI. Follow.    Sleep apnea, unspecified type Assessment & Plan: Recent sleep study - sleep apnea.  Discussed.  Prefers to look into oral device.  Information given.  Follow.     Other orders -     FLUoxetine HCl; Take 1 capsule (10 mg total) by mouth daily.  Dispense: 90 capsule; Refill: 1     Dale [place], MD

## 2023-05-17 ENCOUNTER — Encounter: Payer: Self-pay | Admitting: Internal Medicine

## 2023-05-17 NOTE — Assessment & Plan Note (Signed)
On prozac.  Stable.  Follow.  

## 2023-05-17 NOTE — Assessment & Plan Note (Signed)
Drinking has increased as outlined.  Discussed counseling, AA, etc.  He declines.   Discussed need to decrease alcohol intake.

## 2023-05-17 NOTE — Assessment & Plan Note (Signed)
Has a history of gastric ulcer.  On protonix.  Asymptomatic.

## 2023-05-17 NOTE — Assessment & Plan Note (Signed)
Continue on prozac.  Discussed. Declines counseling.  No SI. Follow.

## 2023-05-17 NOTE — Assessment & Plan Note (Signed)
Low carb diet and exercise.  Follow met b and a1c.   

## 2023-05-17 NOTE — Assessment & Plan Note (Signed)
S/p GI w/up 11/2019 - EGD and colonoscopy.  Follow cbc.

## 2023-05-17 NOTE — Assessment & Plan Note (Signed)
Continue protonix.  Follow.  EGD 11/2019.  No upper symptoms reported.  

## 2023-05-17 NOTE — Assessment & Plan Note (Signed)
Recent sleep study - sleep apnea.  Discussed.  Prefers to look into oral device.  Information given.  Follow.

## 2023-05-19 NOTE — Telephone Encounter (Signed)
Please follow-up on HST results/mychart message below. Need OV

## 2023-05-21 ENCOUNTER — Other Ambulatory Visit: Payer: BC Managed Care – PPO

## 2023-05-21 DIAGNOSIS — R739 Hyperglycemia, unspecified: Secondary | ICD-10-CM | POA: Diagnosis not present

## 2023-05-21 DIAGNOSIS — Z125 Encounter for screening for malignant neoplasm of prostate: Secondary | ICD-10-CM

## 2023-05-21 LAB — HEPATIC FUNCTION PANEL
ALT: 20 U/L (ref 0–53)
AST: 19 U/L (ref 0–37)
Albumin: 4.5 g/dL (ref 3.5–5.2)
Alkaline Phosphatase: 66 U/L (ref 39–117)
Bilirubin, Direct: 0.1 mg/dL (ref 0.0–0.3)
Total Bilirubin: 0.7 mg/dL (ref 0.2–1.2)
Total Protein: 7 g/dL (ref 6.0–8.3)

## 2023-05-21 LAB — BASIC METABOLIC PANEL
BUN: 9 mg/dL (ref 6–23)
CO2: 30 meq/L (ref 19–32)
Calcium: 9.3 mg/dL (ref 8.4–10.5)
Chloride: 101 meq/L (ref 96–112)
Creatinine, Ser: 0.89 mg/dL (ref 0.40–1.50)
GFR: 106.33 mL/min (ref >60.00)
Glucose, Bld: 100 mg/dL — ABNORMAL HIGH (ref 70–99)
Potassium: 4.4 meq/L (ref 3.5–5.1)
Sodium: 139 meq/L (ref 135–145)

## 2023-05-21 LAB — LIPID PANEL
Cholesterol: 191 mg/dL (ref 0–200)
HDL: 74.6 mg/dL (ref >39.00)
LDL Cholesterol: 108 mg/dL — ABNORMAL HIGH (ref 0–99)
NonHDL: 116.38
Total CHOL/HDL Ratio: 3
Triglycerides: 41 mg/dL (ref 0.0–149.0)
VLDL: 8.2 mg/dL (ref 0.0–40.0)

## 2023-05-21 LAB — HEMOGLOBIN A1C: Hgb A1c MFr Bld: 6 % (ref 4.6–6.5)

## 2023-05-21 LAB — PSA: PSA: 0.41 ng/mL (ref 0.10–4.00)

## 2023-06-12 MED FILL — Pantoprazole Sodium EC Tab 40 MG (Base Equiv): ORAL | 30 days supply | Qty: 60 | Fill #7 | Status: AC

## 2023-06-30 NOTE — Addendum Note (Signed)
Addended byClyda Greener M on: 06/30/2023 11:58 AM   Modules accepted: Orders

## 2023-08-03 ENCOUNTER — Other Ambulatory Visit: Payer: Self-pay | Admitting: Internal Medicine

## 2023-08-03 ENCOUNTER — Other Ambulatory Visit: Payer: Self-pay

## 2023-08-03 DIAGNOSIS — K2100 Gastro-esophageal reflux disease with esophagitis, without bleeding: Secondary | ICD-10-CM

## 2023-08-04 ENCOUNTER — Other Ambulatory Visit: Payer: Self-pay

## 2023-08-04 MED FILL — Pantoprazole Sodium EC Tab 40 MG (Base Equiv): ORAL | 30 days supply | Qty: 60 | Fill #0 | Status: CN

## 2023-08-04 MED FILL — Pantoprazole Sodium EC Tab 40 MG (Base Equiv): ORAL | 30 days supply | Qty: 60 | Fill #0 | Status: AC

## 2023-08-09 DIAGNOSIS — R509 Fever, unspecified: Secondary | ICD-10-CM | POA: Diagnosis not present

## 2023-08-09 DIAGNOSIS — J111 Influenza due to unidentified influenza virus with other respiratory manifestations: Secondary | ICD-10-CM | POA: Diagnosis not present

## 2023-09-11 ENCOUNTER — Other Ambulatory Visit: Payer: Self-pay

## 2023-09-11 ENCOUNTER — Encounter: Payer: Self-pay | Admitting: Internal Medicine

## 2023-09-11 ENCOUNTER — Ambulatory Visit: Payer: BC Managed Care – PPO | Admitting: Internal Medicine

## 2023-09-11 VITALS — BP 126/80 | HR 79 | Temp 98.5°F | Ht 64.0 in | Wt 184.4 lb

## 2023-09-11 DIAGNOSIS — K2100 Gastro-esophageal reflux disease with esophagitis, without bleeding: Secondary | ICD-10-CM | POA: Diagnosis not present

## 2023-09-11 DIAGNOSIS — F1029 Alcohol dependence with unspecified alcohol-induced disorder: Secondary | ICD-10-CM

## 2023-09-11 DIAGNOSIS — K219 Gastro-esophageal reflux disease without esophagitis: Secondary | ICD-10-CM

## 2023-09-11 DIAGNOSIS — M7989 Other specified soft tissue disorders: Secondary | ICD-10-CM | POA: Diagnosis not present

## 2023-09-11 DIAGNOSIS — D649 Anemia, unspecified: Secondary | ICD-10-CM

## 2023-09-11 DIAGNOSIS — R739 Hyperglycemia, unspecified: Secondary | ICD-10-CM | POA: Diagnosis not present

## 2023-09-11 DIAGNOSIS — F320 Major depressive disorder, single episode, mild: Secondary | ICD-10-CM

## 2023-09-11 MED ORDER — FLUOXETINE HCL 10 MG PO CAPS
10.0000 mg | ORAL_CAPSULE | Freq: Every day | ORAL | 1 refills | Status: DC
  Filled 2023-09-11: qty 90, 90d supply, fill #0
  Filled 2023-12-20: qty 90, 90d supply, fill #1

## 2023-09-11 MED ORDER — PANTOPRAZOLE SODIUM 40 MG PO TBEC
40.0000 mg | DELAYED_RELEASE_TABLET | Freq: Two times a day (BID) | ORAL | 11 refills | Status: DC
  Filled 2023-09-11: qty 60, 30d supply, fill #0
  Filled 2023-11-09: qty 60, 30d supply, fill #1
  Filled 2023-12-20: qty 60, 30d supply, fill #2
  Filled 2024-02-05: qty 60, 30d supply, fill #3
  Filled 2024-03-14: qty 60, 30d supply, fill #4
  Filled 2024-05-02: qty 60, 30d supply, fill #5

## 2023-09-11 NOTE — Progress Notes (Signed)
 Subjective:    Patient ID: Austin [name], male    DOB: 03-12-1981, 43 y.o.   MRN: [id number]  Patient here for  Chief Complaint  Patient presents with   Medical Management of Chronic Issues    HPI Here for a scheduled follow up - f/u regarding increased stress/depression, GERD and sleep apnea. Sleep study revealed mild OSA.  Reports he is doing relatively well. Work is going well. Still with increased stress - some family stress. Taking prozac. Breathing stable. No abdominal pain or bowel change reported. Concern regarding persistent swelling - left foot and ankle. Swelling is improved - when wakes up in the am. Swelling worsens as day progresses. Has seen [facility] previously. Ultrasound negative for DVT. Did have evidence of reflux in the left great saphenous vein. Discussed wearing compression hose. Continuing to affect him - shoes is tighter, throwing off his gait, etc.    Past Medical History:  Diagnosis Date   Alcohol abuse    history of   Allergy    Anxiety    Depression    GERD (gastroesophageal reflux disease)    Heart murmur    Hypertension    Ulcer    history of bleeding ulcer requiring blood transfusions   Past Surgical History:  Procedure Laterality Date   COLONOSCOPY WITH PROPOFOL N/A 12/12/2019   Procedure: COLONOSCOPY WITH PROPOFOL;  Surgeon: Toledo, Boykin Nearing, MD;  Location: [facility];  Service: Gastroenterology;  Laterality: N/A;   ESOPHAGOGASTRODUODENOSCOPY (EGD) WITH PROPOFOL N/A 12/12/2019   Procedure: ESOPHAGOGASTRODUODENOSCOPY (EGD) WITH PROPOFOL;  Surgeon: Toledo, Boykin Nearing, MD;  Location: [facility];  Service: Gastroenterology;  Laterality: N/A;   KNEE SURGERY     Left knee - ACL   TONSILLECTOMY AND ADENOIDECTOMY  1992   Family History  Problem Relation Age of Onset   Alcohol abuse Father    Cancer Father        prostate cancer   Arthritis Sister    Cancer Maternal Grandmother        breast cancer   Stroke Maternal Grandmother    Diabetes  Maternal Grandmother    Hypertension Maternal Grandmother    Social History   Socioeconomic History   Marital status: Married    Spouse name: Not on file   Number of children: Not on file   Years of education: Not on file   Highest education level: Not on file  Occupational History   Not on file  Tobacco Use   Smoking status: Former    Current packs/day: 0.00    Average packs/day: 0.5 packs/day for 20.0 years (10.0 ttl pk-yrs)    Types: Cigarettes    Start date: 82    Quit date: 2019    Years since quitting: 6.2   Smokeless tobacco: Never   Tobacco comments:    Quit smoking recently  Vaping Use   Vaping status: Never Used  Substance and Sexual Activity   Alcohol use: Yes    Alcohol/week: 2.0 standard drinks of alcohol    Types: 2 Cans of beer per week   Drug use: Yes    Types: Marijuana    Comment: marijuana   Sexual activity: Not on file  Other Topics Concern   Not on file  Social History Narrative   Not on file   Social Drivers of Health   Financial Resource Strain: Low Risk  (02/14/2021)   Overall Financial Resource Strain (CARDIA)    Difficulty of Paying Living Expenses: Not hard at all  Food Insecurity: Not on file  Transportation Needs: Not on file  Physical Activity: Not on file  Stress: Not on file  Social Connections: Not on file     Review of Systems  Constitutional:  Negative for appetite change and unexpected weight change.  HENT:  Negative for congestion and sinus pressure.   Respiratory:  Negative for cough, chest tightness and shortness of breath.   Cardiovascular:  Negative for chest pain and palpitations.       Foot and ankle swelling as outlined (left).   Gastrointestinal:  Negative for abdominal pain, diarrhea, nausea and vomiting.  Genitourinary:  Negative for difficulty urinating and dysuria.  Musculoskeletal:  Negative for myalgias.  Skin:  Negative for color change and rash.  Neurological:  Negative for dizziness and headaches.   Psychiatric/Behavioral:  Negative for agitation and dysphoric mood.        Objective:     BP 126/80   Pulse 79   Temp 98.5 F (36.9 C) (Oral)   Ht 5\' 4"  (1.626 m)   Wt 184 lb 6.4 oz (83.6 kg)   SpO2 97%   BMI 31.65 kg/m  Wt Readings from Last 3 Encounters:  09/11/23 184 lb 6.4 oz (83.6 kg)  05/14/23 181 lb 12.8 oz (82.5 kg)  02/09/23 173 lb 9.6 oz (78.7 kg)    Physical Exam Constitutional:      General: He is not in acute distress.    Appearance: Normal appearance. He is well-developed.  HENT:     Head: Normocephalic and atraumatic.     Right Ear: External ear normal.     Left Ear: External ear normal.     Mouth/Throat:     Pharynx: No oropharyngeal exudate or posterior oropharyngeal erythema.  Eyes:     General: No scleral icterus.       Right eye: No discharge.        Left eye: No discharge.  Cardiovascular:     Rate and Rhythm: Normal rate and regular rhythm.  Pulmonary:     Effort: Pulmonary effort is normal. No respiratory distress.     Breath sounds: Normal breath sounds.  Abdominal:     General: Bowel sounds are normal.     Palpations: Abdomen is soft.     Tenderness: There is no abdominal tenderness.  Musculoskeletal:        General: No tenderness.     Cervical back: Neck supple. No tenderness.     Comments: Left foot/ankle swelling.   Lymphadenopathy:     Cervical: No cervical adenopathy.  Skin:    Findings: No erythema or rash.  Neurological:     Mental Status: He is alert.  Psychiatric:        Mood and Affect: Mood normal.        Behavior: Behavior normal.         Outpatient Encounter Medications as of 09/11/2023  Medication Sig   acetaminophen (TYLENOL) 500 MG tablet Take 500 mg by mouth 2 (two) times daily as needed.   Ascorbic Acid (VITAMIN C) 100 MG tablet Take 100 mg by mouth daily.   cetirizine (ZYRTEC) 10 MG tablet Take 10 mg by mouth daily.   fluticasone (FLONASE SENSIMIST) 27.5 MCG/SPRAY nasal spray Place 1 spray into the nose  daily.   [DISCONTINUED] FLUoxetine (PROZAC) 10 MG capsule Take 1 capsule (10 mg total) by mouth daily.   [DISCONTINUED] pantoprazole (PROTONIX) 40 MG tablet Take 1 tablet (40 mg total) by mouth 2 (two) times daily.  FLUoxetine (PROZAC) 10 MG capsule Take 1 capsule (10 mg total) by mouth daily.   pantoprazole (PROTONIX) 40 MG tablet Take 1 tablet (40 mg total) by mouth 2 (two) times daily.   No facility-administered encounter medications on file as of 09/11/2023.     Lab Results  Component Value Date   WBC 5.5 09/11/2023   HGB 13.9 09/11/2023   HCT 40.6 09/11/2023   PLT 341 09/11/2023   GLUCOSE 90 09/11/2023   CHOL 191 09/11/2023   TRIG 62 09/11/2023   HDL 87 09/11/2023   LDLCALC 90 09/11/2023   ALT 23 09/11/2023   AST 19 09/11/2023   NA 139 09/11/2023   K 4.0 09/11/2023   CL 102 09/11/2023   CREATININE 0.82 09/11/2023   BUN 9 09/11/2023   CO2 26 09/11/2023   TSH 1.40 09/11/2023   PSA 0.41 05/21/2023   HGBA1C 5.9 (H) 09/11/2023       Assessment & Plan:  Hyperglycemia Assessment & Plan: Low carb diet and exercise. Follow met b and A1c.   Orders: -     Hemoglobin A1c -     Hepatic function panel -     Lipid panel -     TSH -     BASIC METABOLIC PANEL WITH eGFR  Anemia, unspecified type Assessment & Plan: S/p GI w/up 11/2019 - EGD and colonoscopy.  Follow cbc.   Orders: -     CBC with Differential/Platelet  Gastroesophageal reflux disease with esophagitis -     Pantoprazole Sodium; Take 1 tablet (40 mg total) by mouth 2 (two) times daily.  Dispense: 60 tablet; Refill: 11  Swelling of limb Assessment & Plan: Persistent left foot and ankle swelling as outlined. Swelling worsens as day progresses. Discussed compression hose. Previous ultrasound revealed evidence of reflux in the left great saphenous vein. Bothering him more. Will get him back in with [facility] for f/u and further evaluation and treatment.    Major depressive disorder, single episode, mild  (HCC) Assessment & Plan: Continues on prozac. Have discussed counseling. Does not feel needs any further intervention at this time. Follow.    Gastroesophageal reflux disease, unspecified whether esophagitis present Assessment & Plan: Continue protonix. EGD 11/2019. No upper symptoms reported.    Alcohol dependence with unspecified alcohol-induced disorder [facility]) Assessment & Plan:  Drinking discussed. Discussed counseling, AA, etc. He declines at this time. Follow.    Other orders -     FLUoxetine HCl; Take 1 capsule (10 mg total) by mouth daily.  Dispense: 90 capsule; Refill: 1     Dale Kemp, MD

## 2023-09-12 ENCOUNTER — Encounter: Payer: Self-pay | Admitting: Internal Medicine

## 2023-09-12 LAB — HEPATIC FUNCTION PANEL
AG Ratio: 1.6 (calc) (ref 1.0–2.5)
ALT: 23 U/L (ref 9–46)
AST: 19 U/L (ref 10–40)
Albumin: 4.6 g/dL (ref 3.6–5.1)
Alkaline phosphatase (APISO): 66 U/L (ref 36–130)
Bilirubin, Direct: 0.1 mg/dL (ref 0.0–0.2)
Globulin: 2.8 g/dL (ref 1.9–3.7)
Indirect Bilirubin: 0.3 mg/dL (ref 0.2–1.2)
Total Bilirubin: 0.4 mg/dL (ref 0.2–1.2)
Total Protein: 7.4 g/dL (ref 6.1–8.1)

## 2023-09-12 LAB — CBC WITH DIFFERENTIAL/PLATELET
Absolute Lymphocytes: 2354 {cells}/uL (ref 850–3900)
Absolute Monocytes: 583 {cells}/uL (ref 200–950)
Basophils Absolute: 28 {cells}/uL (ref 0–200)
Basophils Relative: 0.5 %
Eosinophils Absolute: 171 {cells}/uL (ref 15–500)
Eosinophils Relative: 3.1 %
HCT: 40.6 % (ref 38.5–50.0)
Hemoglobin: 13.9 g/dL (ref 13.2–17.1)
MCH: 30.6 pg (ref 27.0–33.0)
MCHC: 34.2 g/dL (ref 32.0–36.0)
MCV: 89.4 fL (ref 80.0–100.0)
MPV: 8.7 fL (ref 7.5–12.5)
Monocytes Relative: 10.6 %
Neutro Abs: 2365 {cells}/uL (ref 1500–7800)
Neutrophils Relative %: 43 %
Platelets: 341 10*3/uL (ref 140–400)
RBC: 4.54 10*6/uL (ref 4.20–5.80)
RDW: 12.5 % (ref 11.0–15.0)
Total Lymphocyte: 42.8 %
WBC: 5.5 10*3/uL (ref 3.8–10.8)

## 2023-09-12 LAB — BASIC METABOLIC PANEL WITH GFR
BUN: 9 mg/dL (ref 7–25)
CO2: 26 mmol/L (ref 20–32)
Calcium: 9.4 mg/dL (ref 8.6–10.3)
Chloride: 102 mmol/L (ref 98–110)
Creat: 0.82 mg/dL (ref 0.60–1.29)
Glucose, Bld: 90 mg/dL (ref 65–99)
Potassium: 4 mmol/L (ref 3.5–5.3)
Sodium: 139 mmol/L (ref 135–146)
eGFR: 113 mL/min/{1.73_m2} (ref >=60)

## 2023-09-12 LAB — LIPID PANEL
Cholesterol: 191 mg/dL (ref <200)
HDL: 87 mg/dL (ref >=40)
LDL Cholesterol (Calc): 90 mg/dL
Non-HDL Cholesterol (Calc): 104 mg/dL (ref <130)
Total CHOL/HDL Ratio: 2.2 (calc) (ref <5.0)
Triglycerides: 62 mg/dL (ref <150)

## 2023-09-12 LAB — HEMOGLOBIN A1C
Hgb A1c MFr Bld: 5.9 %{Hb} — ABNORMAL HIGH (ref <5.7)
Mean Plasma Glucose: 123 mg/dL
eAG (mmol/L): 6.8 mmol/L

## 2023-09-12 LAB — TSH: TSH: 1.4 m[IU]/L (ref 0.40–4.50)

## 2023-09-12 NOTE — Assessment & Plan Note (Signed)
 Low-carb diet and exercise.  Follow met b and A1c.

## 2023-09-12 NOTE — Assessment & Plan Note (Signed)
 Drinking discussed. Discussed counseling, AA, etc. He declines at this time. Follow.

## 2023-09-12 NOTE — Assessment & Plan Note (Signed)
 Continue protonix. EGD 11/2019. No upper symptoms reported.

## 2023-09-12 NOTE — Assessment & Plan Note (Signed)
 Persistent left foot and ankle swelling as outlined. Swelling worsens as day progresses. Discussed compression hose. Previous ultrasound revealed evidence of reflux in the left great saphenous vein. Bothering him more. Will get him back in with [facility] for f/u and further evaluation and treatment.

## 2023-09-12 NOTE — Assessment & Plan Note (Signed)
S/p GI w/up 11/2019 - EGD and colonoscopy.  Follow cbc.

## 2023-09-12 NOTE — Assessment & Plan Note (Signed)
 Continues on prozac. Have discussed counseling. Does not feel needs any further intervention at this time. Follow.

## 2023-09-28 NOTE — Telephone Encounter (Signed)
 Please call him and confirm he is doing ok.  See if he wants an appt to see me and [place] talk more or I can get him some names of programs. I will do whatever he prefers.

## 2023-09-28 NOTE — Telephone Encounter (Signed)
 Spoke with patient to follow up to discuss concerns. Patient says he would like to make an appointment with Dr Lorin Picket to discuss in person his concerns. Patient is scheduled for Friday April 4th at 8:00 am. Patient verbalized understanding and has no further questions at this time.

## 2023-10-02 ENCOUNTER — Ambulatory Visit: Admitting: Internal Medicine

## 2023-10-02 ENCOUNTER — Encounter: Payer: Self-pay | Admitting: Internal Medicine

## 2023-10-02 VITALS — BP 130/80 | HR 90 | Temp 98.2°F | Resp 16 | Ht 65.0 in | Wt 185.0 lb

## 2023-10-02 DIAGNOSIS — F1029 Alcohol dependence with unspecified alcohol-induced disorder: Secondary | ICD-10-CM | POA: Diagnosis not present

## 2023-10-02 DIAGNOSIS — F320 Major depressive disorder, single episode, mild: Secondary | ICD-10-CM | POA: Diagnosis not present

## 2023-10-02 DIAGNOSIS — R739 Hyperglycemia, unspecified: Secondary | ICD-10-CM

## 2023-10-02 NOTE — Progress Notes (Signed)
 Subjective:    Patient ID: Austin [name], male    DOB: [birth year], 43 y.o.   MRN: [id number]  Patient here for work in appt  HPI Here for work in appt. Work in to discuss increased stress and alcohol dependency. He recently has cut down significantly on his drinking. Feels increased stress is contributing to his current feelings. Uses alcohol to relax, but has decreased intake recently. Discussed with his wife. Request to see a counselor to discuss increased stress. Is exercising more. Staying more active. Breathing stable.    Past Medical History:  Diagnosis Date   Alcohol abuse    history of   Allergy    Anxiety    Depression    GERD (gastroesophageal reflux disease)    Heart murmur    Hypertension    Ulcer    history of bleeding ulcer requiring blood transfusions   Past Surgical History:  Procedure Laterality Date   COLONOSCOPY WITH PROPOFOL N/A 12/12/2019   Procedure: COLONOSCOPY WITH PROPOFOL;  Surgeon: Toledo, Boykin Nearing, MD;  Location: [facility];  Service: Gastroenterology;  Laterality: N/A;   ESOPHAGOGASTRODUODENOSCOPY (EGD) WITH PROPOFOL N/A 12/12/2019   Procedure: ESOPHAGOGASTRODUODENOSCOPY (EGD) WITH PROPOFOL;  Surgeon: Toledo, Boykin Nearing, MD;  Location: [facility];  Service: Gastroenterology;  Laterality: N/A;   KNEE SURGERY     Left knee - ACL   TONSILLECTOMY AND ADENOIDECTOMY  1992   Family History  Problem Relation Age of Onset   Alcohol abuse Father    Cancer Father        prostate cancer   Arthritis Sister    Cancer Maternal Grandmother        breast cancer   Stroke Maternal Grandmother    Diabetes Maternal Grandmother    Hypertension Maternal Grandmother    Social History   Socioeconomic History   Marital status: Married    Spouse name: Not on file   Number of children: Not on file   Years of education: Not on file   Highest education level: Not on file  Occupational History   Not on file  Tobacco Use   Smoking status: Former    Current  packs/day: 0.00    Average packs/day: 0.5 packs/day for 20.0 years (10.0 ttl pk-yrs)    Types: Cigarettes    Start date: 50    Quit date: 2019    Years since quitting: 6.2   Smokeless tobacco: Never   Tobacco comments:    Quit smoking recently  Vaping Use   Vaping status: Never Used  Substance and Sexual Activity   Alcohol use: Yes    Alcohol/week: 2.0 standard drinks of alcohol    Types: 2 Cans of beer per week   Drug use: Yes    Types: Marijuana    Comment: marijuana   Sexual activity: Not on file  Other Topics Concern   Not on file  Social History Narrative   Not on file   Social Drivers of Health   Financial Resource Strain: Low Risk  (02/14/2021)   Overall Financial Resource Strain (CARDIA)    Difficulty of Paying Living Expenses: Not hard at all  Food Insecurity: Not on file  Transportation Needs: Not on file  Physical Activity: Not on file  Stress: Not on file  Social Connections: Not on file     Review of Systems  Constitutional:  Negative for appetite change and unexpected weight change.  HENT:  Negative for congestion and sinus pressure.   Respiratory:  Negative for  cough, chest tightness and shortness of breath.   Cardiovascular:  Negative for chest pain, palpitations and leg swelling.  Gastrointestinal:  Negative for abdominal pain, diarrhea, nausea and vomiting.  Genitourinary:  Negative for difficulty urinating and dysuria.  Musculoskeletal:  Negative for joint swelling and myalgias.  Skin:  Negative for color change and rash.  Neurological:  Negative for dizziness and headaches.  Psychiatric/Behavioral:  Negative for agitation and dysphoric mood.        Increased stress.        Objective:     BP 130/80   Pulse 90   Temp 98.2 F (36.8 C)   Resp 16   Ht 5\' 5"  (1.651 m)   Wt 185 lb (83.9 kg)   SpO2 98%   BMI 30.79 kg/m  Wt Readings from Last 3 Encounters:  10/02/23 185 lb (83.9 kg)  09/11/23 184 lb 6.4 oz (83.6 kg)  05/14/23 181 lb 12.8  oz (82.5 kg)    Physical Exam Vitals reviewed.  Constitutional:      General: He is not in acute distress.    Appearance: Normal appearance. He is well-developed.  HENT:     Head: Normocephalic and atraumatic.     Right Ear: External ear normal.     Left Ear: External ear normal.     Mouth/Throat:     Pharynx: No oropharyngeal exudate or posterior oropharyngeal erythema.  Eyes:     General: No scleral icterus.       Right eye: No discharge.        Left eye: No discharge.     Conjunctiva/sclera: Conjunctivae normal.  Cardiovascular:     Rate and Rhythm: Normal rate and regular rhythm.  Pulmonary:     Effort: Pulmonary effort is normal. No respiratory distress.     Breath sounds: Normal breath sounds.  Abdominal:     General: Bowel sounds are normal.     Palpations: Abdomen is soft.     Tenderness: There is no abdominal tenderness.  Musculoskeletal:        General: No swelling or tenderness.     Cervical back: Neck supple. No tenderness.  Lymphadenopathy:     Cervical: No cervical adenopathy.  Skin:    Findings: No erythema or rash.  Neurological:     Mental Status: He is alert.  Psychiatric:        Mood and Affect: Mood normal.        Behavior: Behavior normal.         Outpatient Encounter Medications as of 10/02/2023  Medication Sig   acetaminophen (TYLENOL) 500 MG tablet Take 500 mg by mouth 2 (two) times daily as needed.   Ascorbic Acid (VITAMIN C) 100 MG tablet Take 100 mg by mouth daily.   cetirizine (ZYRTEC) 10 MG tablet Take 10 mg by mouth daily.   FLUoxetine (PROZAC) 10 MG capsule Take 1 capsule (10 mg total) by mouth daily.   fluticasone (FLONASE SENSIMIST) 27.5 MCG/SPRAY nasal spray Place 1 spray into the nose daily.   pantoprazole (PROTONIX) 40 MG tablet Take 1 tablet (40 mg total) by mouth 2 (two) times daily.   No facility-administered encounter medications on file as of 10/02/2023.     Lab Results  Component Value Date   WBC 5.5 09/11/2023   HGB  13.9 09/11/2023   HCT 40.6 09/11/2023   PLT 341 09/11/2023   GLUCOSE 90 09/11/2023   CHOL 191 09/11/2023   TRIG 62 09/11/2023   HDL 87 09/11/2023  LDLCALC 90 09/11/2023   ALT 23 09/11/2023   AST 19 09/11/2023   NA 139 09/11/2023   K 4.0 09/11/2023   CL 102 09/11/2023   CREATININE 0.82 09/11/2023   BUN 9 09/11/2023   CO2 26 09/11/2023   TSH 1.40 09/11/2023   PSA 0.41 05/21/2023   HGBA1C 5.9 (H) 09/11/2023       Assessment & Plan:  Major depressive disorder, single episode, mild (HCC) Assessment & Plan: Continues on prozac. Increased stress recently as outlined. Discussed counseling. He is agreeable. Information given. Follow. Has cut down his alcohol recently.    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    Alcohol dependence with unspecified alcohol-induced disorder [facility]) Assessment & Plan:  Drinking discussed. Has cut down alcohol intake. Feels increased stress contributes to his alcohol intake. Counseling as outlined.       Dale [place], MD

## 2023-10-05 NOTE — Assessment & Plan Note (Signed)
 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

## 2023-10-05 NOTE — Assessment & Plan Note (Signed)
 Continues on prozac. Increased stress recently as outlined. Discussed counseling. He is agreeable. Information given. Follow. Has cut down his alcohol recently.

## 2023-10-05 NOTE — Assessment & Plan Note (Signed)
 Drinking discussed. Has cut down alcohol intake. Feels increased stress contributes to his alcohol intake. Counseling as outlined.

## 2023-11-09 DIAGNOSIS — H52223 Regular astigmatism, bilateral: Secondary | ICD-10-CM | POA: Diagnosis not present

## 2023-11-09 DIAGNOSIS — H524 Presbyopia: Secondary | ICD-10-CM | POA: Diagnosis not present

## 2023-11-27 ENCOUNTER — Telehealth: Payer: Self-pay | Admitting: Internal Medicine

## 2023-11-27 NOTE — Telephone Encounter (Signed)
 Lm and sent MyChart note. Dr Geralyn Knee will not be in the office on June 20th. Please call and reschedule your appointment. E2C2 please reschedule appointment.

## 2024-01-11 ENCOUNTER — Ambulatory Visit: Admitting: Internal Medicine

## 2024-02-16 ENCOUNTER — Encounter: Payer: Self-pay | Admitting: Internal Medicine

## 2024-02-16 ENCOUNTER — Ambulatory Visit: Admitting: Internal Medicine

## 2024-02-16 ENCOUNTER — Other Ambulatory Visit: Payer: Self-pay

## 2024-02-16 VITALS — BP 120/72 | HR 75 | Resp 16 | Ht 65.0 in | Wt 188.0 lb

## 2024-02-16 DIAGNOSIS — F1029 Alcohol dependence with unspecified alcohol-induced disorder: Secondary | ICD-10-CM | POA: Diagnosis not present

## 2024-02-16 DIAGNOSIS — M7989 Other specified soft tissue disorders: Secondary | ICD-10-CM

## 2024-02-16 DIAGNOSIS — K219 Gastro-esophageal reflux disease without esophagitis: Secondary | ICD-10-CM

## 2024-02-16 DIAGNOSIS — D649 Anemia, unspecified: Secondary | ICD-10-CM | POA: Diagnosis not present

## 2024-02-16 DIAGNOSIS — Z0000 Encounter for general adult medical examination without abnormal findings: Secondary | ICD-10-CM

## 2024-02-16 DIAGNOSIS — F320 Major depressive disorder, single episode, mild: Secondary | ICD-10-CM

## 2024-02-16 DIAGNOSIS — R739 Hyperglycemia, unspecified: Secondary | ICD-10-CM

## 2024-02-16 DIAGNOSIS — F419 Anxiety disorder, unspecified: Secondary | ICD-10-CM

## 2024-02-16 MED ORDER — SCOPOLAMINE 1 MG/3DAYS TD PT72
1.0000 | MEDICATED_PATCH | TRANSDERMAL | 0 refills | Status: DC
  Filled 2024-02-16: qty 10, 30d supply, fill #0

## 2024-02-16 NOTE — Progress Notes (Signed)
 Subjective:    Patient ID: Austin [name], male    DOB: Apr 01, 1981, 43 y.o.   MRN: [id number]  Patient here for  Chief Complaint  Patient presents with   Annual Exam    HPI Here for wellness exam - needed for work. F/u regarding increased stress/depression, GERD and sleep apnea. Sleep study revealed mild OSA. Concern regarding persistent swelling - left foot and ankle. Swelling improved - when wakes up in the am. Swelling worsens as day progresses. Has seen [facility] previously. Ultrasound negative for DVT. Did have evidence of reflux in the left great saphenous vein. Previously discussed wearing compression hose. Discussed f/u with [facility]. Continues on prozac . Last visit, discussed seeing a counselor and help with alcohol dependency. He did look into the information given, but has not scheduled to date. Discussed. Request different program. Still with increased stress. No chest pain or sob reported. No abdominal pain or bowel change reported. Planning to go on a cruise. Request scopolamine  patches. Has used previously and tolerated.    Past Medical History:  Diagnosis Date   Alcohol abuse    history of   Allergy    Anxiety    Depression    GERD (gastroesophageal reflux disease)    Heart murmur    Hypertension    Ulcer    history of bleeding ulcer requiring blood transfusions   Past Surgical History:  Procedure Laterality Date   COLONOSCOPY WITH PROPOFOL  N/A 12/12/2019   Procedure: COLONOSCOPY WITH PROPOFOL ;  Surgeon: Toledo, Ladell POUR, MD;  Location: [facility];  Service: Gastroenterology;  Laterality: N/A;   ESOPHAGOGASTRODUODENOSCOPY (EGD) WITH PROPOFOL  N/A 12/12/2019   Procedure: ESOPHAGOGASTRODUODENOSCOPY (EGD) WITH PROPOFOL ;  Surgeon: Toledo, Ladell POUR, MD;  Location: [facility];  Service: Gastroenterology;  Laterality: N/A;   KNEE SURGERY     Left knee - ACL   TONSILLECTOMY AND ADENOIDECTOMY  1992   Family History  Problem Relation Age of Onset   Alcohol abuse Father     Cancer Father        prostate cancer   Arthritis Sister    Cancer Maternal Grandmother        breast cancer   Stroke Maternal Grandmother    Diabetes Maternal Grandmother    Hypertension Maternal Grandmother    Social History   Socioeconomic History   Marital status: Married    Spouse name: Not on file   Number of children: Not on file   Years of education: Not on file   Highest education level: Not on file  Occupational History   Not on file  Tobacco Use   Smoking status: Former    Current packs/day: 0.00    Average packs/day: 0.5 packs/day for 20.0 years (10.0 ttl pk-yrs)    Types: Cigarettes    Start date: 36    Quit date: 2019    Years since quitting: 6.6   Smokeless tobacco: Never   Tobacco comments:    Quit smoking recently  Vaping Use   Vaping status: Never Used  Substance and Sexual Activity   Alcohol use: Yes    Alcohol/week: 2.0 standard drinks of alcohol    Types: 2 Cans of beer per week   Drug use: Yes    Types: Marijuana    Comment: marijuana   Sexual activity: Not on file  Other Topics Concern   Not on file  Social History Narrative   Not on file   Social Drivers of Health   Financial Resource Strain: Low Risk  (  02/14/2021)   Overall Financial Resource Strain (CARDIA)    Difficulty of Paying Living Expenses: Not hard at all  Food Insecurity: Not on file  Transportation Needs: Not on file  Physical Activity: Not on file  Stress: Not on file  Social Connections: Not on file     Review of Systems  Constitutional:  Negative for appetite change and unexpected weight change.  HENT:  Negative for congestion, sinus pressure and sore throat.   Eyes:  Negative for pain and visual disturbance.  Respiratory:  Negative for cough, chest tightness and shortness of breath.   Cardiovascular:  Negative for chest pain and palpitations.       Swelling left foot and ankle - chronic - worse as day progresses.   Gastrointestinal:  Negative for abdominal pain,  diarrhea, nausea and vomiting.  Genitourinary:  Negative for difficulty urinating and dysuria.  Musculoskeletal:  Negative for joint swelling and myalgias.  Skin:  Negative for color change and rash.  Neurological:  Negative for dizziness and headaches.  Hematological:  Negative for adenopathy. Does not bruise/bleed easily.  Psychiatric/Behavioral:  Negative for agitation and dysphoric mood.        Objective:     BP 120/72   Pulse 75   Resp 16   Ht 5' 5 (1.651 m)   Wt 188 lb (85.3 kg)   SpO2 99%   BMI 31.28 kg/m  Wt Readings from Last 3 Encounters:  02/16/24 188 lb (85.3 kg)  10/02/23 185 lb (83.9 kg)  09/11/23 184 lb 6.4 oz (83.6 kg)    Physical Exam Vitals reviewed.  Constitutional:      General: He is not in acute distress.    Appearance: Normal appearance. He is well-developed.  HENT:     Head: Normocephalic and atraumatic.     Right Ear: External ear normal.     Left Ear: External ear normal.     Mouth/Throat:     Pharynx: No oropharyngeal exudate or posterior oropharyngeal erythema.  Eyes:     General: No scleral icterus.       Right eye: No discharge.        Left eye: No discharge.     Conjunctiva/sclera: Conjunctivae normal.  Cardiovascular:     Rate and Rhythm: Normal rate and regular rhythm.  Pulmonary:     Effort: Pulmonary effort is normal. No respiratory distress.     Breath sounds: Normal breath sounds.  Abdominal:     General: Bowel sounds are normal.     Palpations: Abdomen is soft.     Tenderness: There is no abdominal tenderness.  Musculoskeletal:        General: No tenderness.     Cervical back: Neck supple. No tenderness.     Comments: No increased swelling. Stable - foot/ankle   Lymphadenopathy:     Cervical: No cervical adenopathy.  Skin:    Findings: No erythema or rash.  Neurological:     Mental Status: He is alert.  Psychiatric:        Mood and Affect: Mood normal.        Behavior: Behavior normal.         Outpatient  Encounter Medications as of 02/16/2024  Medication Sig   scopolamine  (TRANSDERM-SCOP) 1 MG/3DAYS Place 1 patch (1.5 mg total) onto the skin every 3 (three) days.   acetaminophen (TYLENOL) 500 MG tablet Take 500 mg by mouth 2 (two) times daily as needed.   Ascorbic Acid (VITAMIN C) 100 MG tablet Take  100 mg by mouth daily.   cetirizine (ZYRTEC) 10 MG tablet Take 10 mg by mouth daily.   FLUoxetine  (PROZAC ) 10 MG capsule Take 1 capsule (10 mg total) by mouth daily.   fluticasone  (FLONASE  SENSIMIST) 27.5 MCG/SPRAY nasal spray Place 1 spray into the nose daily.   pantoprazole  (PROTONIX ) 40 MG tablet Take 1 tablet (40 mg total) by mouth 2 (two) times daily.   No facility-administered encounter medications on file as of 02/16/2024.     Lab Results  Component Value Date   WBC 5.5 09/11/2023   HGB 13.9 09/11/2023   HCT 40.6 09/11/2023   PLT 341 09/11/2023   GLUCOSE 90 09/11/2023   CHOL 191 09/11/2023   TRIG 62 09/11/2023   HDL 87 09/11/2023   LDLCALC 90 09/11/2023   ALT 23 09/11/2023   AST 19 09/11/2023   NA 139 09/11/2023   K 4.0 09/11/2023   CL 102 09/11/2023   CREATININE 0.82 09/11/2023   BUN 9 09/11/2023   CO2 26 09/11/2023   TSH 1.40 09/11/2023   PSA 0.41 05/21/2023   HGBA1C 5.9 (H) 09/11/2023       Assessment & Plan:  Routine general medical examination at a health care facility  Hyperglycemia Assessment & Plan: Low carb diet and exercise. Follow met b and A1c.   Orders: -     Basic metabolic panel with GFR; Future -     Hemoglobin A1c; Future -     Hepatic function panel; Future -     Lipid panel; Future  Alcohol dependence with unspecified alcohol-induced disorder [facility]) Assessment & Plan: Drinking discussed. Information given last visit. See if can provide new information. Discussed need to decrease amount of alcohol. Follow.    Anemia, unspecified type Assessment & Plan: S/p GI w/up 11/2019 - EGD and colonoscopy.  Follow [facility].    Anxiety Assessment &  Plan: Continue on prozac . Hold on making changes in medication. Follow.    Gastroesophageal reflux disease, unspecified whether esophagitis present Assessment & Plan: Continue protonix . EGD 11/2019. No upper symptoms reported. No changes today.    Major depressive disorder, single episode, mild (HCC) Assessment & Plan: Continues on prozac . Increased stress recently as outlined. Again discussed counseling. No changes in medication today.    Swelling of limb Assessment & Plan: Persistent left foot and ankle swelling as outlined. Swelling worsens as day progresses. Discussed compression hose. Previous ultrasound revealed evidence of reflux in the left great saphenous vein. Bothering him more. Discussed f/u with [facility].    Other orders -     Scopolamine ; Place 1 patch (1.5 mg total) onto the skin every 3 (three) days.  Dispense: 10 patch; Refill: 0     Allena Hamilton, MD

## 2024-02-21 ENCOUNTER — Encounter: Payer: Self-pay | Admitting: Internal Medicine

## 2024-02-21 NOTE — Assessment & Plan Note (Signed)
 Drinking discussed. Information given last visit. See if can provide new information. Discussed need to decrease amount of alcohol. Follow.

## 2024-02-21 NOTE — Assessment & Plan Note (Signed)
 Continue on prozac . Hold on making changes in medication. Follow.

## 2024-02-21 NOTE — Assessment & Plan Note (Signed)
 Low-carb diet and exercise.  Follow met b and A1c.

## 2024-02-21 NOTE — Assessment & Plan Note (Signed)
 Continues on prozac . Increased stress recently as outlined. Again discussed counseling. No changes in medication today.

## 2024-02-21 NOTE — Assessment & Plan Note (Signed)
S/p GI w/up 11/2019 - EGD and colonoscopy.  Follow cbc.

## 2024-02-21 NOTE — Assessment & Plan Note (Signed)
 Continue protonix . EGD 11/2019. No upper symptoms reported. No changes today.

## 2024-02-21 NOTE — Assessment & Plan Note (Signed)
 Persistent left foot and ankle swelling as outlined. Swelling worsens as day progresses. Discussed compression hose. Previous ultrasound revealed evidence of reflux in the left great saphenous vein. Bothering him more. Discussed f/u with [facility].

## 2024-02-23 ENCOUNTER — Other Ambulatory Visit (INDEPENDENT_AMBULATORY_CARE_PROVIDER_SITE_OTHER)

## 2024-02-23 DIAGNOSIS — R739 Hyperglycemia, unspecified: Secondary | ICD-10-CM | POA: Diagnosis not present

## 2024-02-23 LAB — HEPATIC FUNCTION PANEL
ALT: 25 U/L (ref 0–53)
AST: 21 U/L (ref 0–37)
Albumin: 4.7 g/dL (ref 3.5–5.2)
Alkaline Phosphatase: 66 U/L (ref 39–117)
Bilirubin, Direct: 0.2 mg/dL (ref 0.0–0.3)
Total Bilirubin: 0.8 mg/dL (ref 0.2–1.2)
Total Protein: 7.5 g/dL (ref 6.0–8.3)

## 2024-02-23 LAB — BASIC METABOLIC PANEL WITH GFR
BUN: 8 mg/dL (ref 6–23)
CO2: 32 meq/L (ref 19–32)
Calcium: 9.5 mg/dL (ref 8.4–10.5)
Chloride: 98 meq/L (ref 96–112)
Creatinine, Ser: 0.87 mg/dL (ref 0.40–1.50)
GFR: 106.49 mL/min (ref >60.00)
Glucose, Bld: 94 mg/dL (ref 70–99)
Potassium: 4.5 meq/L (ref 3.5–5.1)
Sodium: 140 meq/L (ref 135–145)

## 2024-02-23 LAB — LIPID PANEL
Cholesterol: 196 mg/dL (ref 0–200)
HDL: 82.3 mg/dL (ref >39.00)
LDL Cholesterol: 100 mg/dL — ABNORMAL HIGH (ref 0–99)
NonHDL: 113.8
Total CHOL/HDL Ratio: 2
Triglycerides: 71 mg/dL (ref 0.0–149.0)
VLDL: 14.2 mg/dL (ref 0.0–40.0)

## 2024-02-23 LAB — HEMOGLOBIN A1C: Hgb A1c MFr Bld: 6.4 % (ref 4.6–6.5)

## 2024-02-24 ENCOUNTER — Ambulatory Visit: Payer: Self-pay | Admitting: Internal Medicine

## 2024-03-14 ENCOUNTER — Other Ambulatory Visit: Payer: Self-pay

## 2024-03-14 ENCOUNTER — Other Ambulatory Visit: Payer: Self-pay | Admitting: Internal Medicine

## 2024-03-14 MED ORDER — FLUOXETINE HCL 10 MG PO CAPS
10.0000 mg | ORAL_CAPSULE | Freq: Every day | ORAL | 1 refills | Status: DC
  Filled 2024-03-14 – 2024-05-02 (×2): qty 90, 90d supply, fill #0

## 2024-03-24 ENCOUNTER — Other Ambulatory Visit: Payer: Self-pay

## 2024-03-25 ENCOUNTER — Ambulatory Visit: Admitting: Internal Medicine

## 2024-05-02 ENCOUNTER — Other Ambulatory Visit: Payer: Self-pay

## 2024-05-03 ENCOUNTER — Ambulatory Visit

## 2024-05-03 VITALS — BP 134/90 | HR 89 | Temp 98.5°F | Ht 65.0 in | Wt 194.4 lb

## 2024-05-03 DIAGNOSIS — Z9109 Other allergy status, other than to drugs and biological substances: Secondary | ICD-10-CM | POA: Diagnosis not present

## 2024-05-03 DIAGNOSIS — H9202 Otalgia, left ear: Secondary | ICD-10-CM | POA: Insufficient documentation

## 2024-05-03 DIAGNOSIS — R030 Elevated blood-pressure reading, without diagnosis of hypertension: Secondary | ICD-10-CM | POA: Insufficient documentation

## 2024-05-03 NOTE — Assessment & Plan Note (Signed)
 Chronic non-seasonal allergic rhinitis with nasal congestion and difficulty breathing. Use Flonase  nasal spray, two puffs in each nostril daily for ten days, then one puff daily. Educated on proper Flonase  administration technique.

## 2024-05-03 NOTE — Assessment & Plan Note (Addendum)
 Elevated blood pressure possibly due to pain and discomfort. Advised home blood pressure monitoring two to three times a week.  Instructed to bring blood pressure readings to next appointment with Doctor Glendia.

## 2024-05-03 NOTE — Patient Instructions (Addendum)
 Use flonase  nasal spray 2 puffs in each nostril daily for the next 10 days.  ENT referral to [facility] is done.  Check BP at home 2-3 times a week, bring home blood pressure reading during your visit with Dr. Glendia.
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
## 2024-05-06 ENCOUNTER — Other Ambulatory Visit: Payer: Self-pay

## 2024-05-06 MED ORDER — FLUTICASONE PROPIONATE 50 MCG/ACT NA SUSP
2.0000 | Freq: Every day | NASAL | 12 refills | Status: AC
  Filled 2024-05-06: qty 16, 30d supply, fill #0
  Filled 2024-06-18: qty 16, 30d supply, fill #1

## 2024-05-06 MED ORDER — PREDNISONE 10 MG PO TABS
ORAL_TABLET | ORAL | 0 refills | Status: DC
  Filled 2024-05-06: qty 21, 6d supply, fill #0

## 2024-05-12 ENCOUNTER — Other Ambulatory Visit: Payer: Self-pay

## 2024-05-12 DIAGNOSIS — J019 Acute sinusitis, unspecified: Secondary | ICD-10-CM | POA: Diagnosis not present

## 2024-05-12 DIAGNOSIS — H9011 Conductive hearing loss, unilateral, right ear, with unrestricted hearing on the contralateral side: Secondary | ICD-10-CM | POA: Diagnosis not present

## 2024-05-12 DIAGNOSIS — H6502 Acute serous otitis media, left ear: Secondary | ICD-10-CM | POA: Diagnosis not present

## 2024-05-12 MED ORDER — CEFDINIR 300 MG PO CAPS
300.0000 mg | ORAL_CAPSULE | Freq: Two times a day (BID) | ORAL | 0 refills | Status: DC
  Filled 2024-05-12: qty 20, 10d supply, fill #0

## 2024-05-12 MED ORDER — PREDNISONE 10 MG PO TABS
ORAL_TABLET | ORAL | 0 refills | Status: AC
  Filled 2024-05-12: qty 48, 12d supply, fill #0

## 2024-05-19 ENCOUNTER — Other Ambulatory Visit: Payer: Self-pay

## 2024-05-19 ENCOUNTER — Encounter: Payer: Self-pay | Admitting: Internal Medicine

## 2024-05-19 ENCOUNTER — Ambulatory Visit: Admitting: Internal Medicine

## 2024-05-19 VITALS — BP 138/84 | HR 87 | Temp 98.0°F | Ht 65.0 in | Wt 194.0 lb

## 2024-05-19 DIAGNOSIS — K219 Gastro-esophageal reflux disease without esophagitis: Secondary | ICD-10-CM

## 2024-05-19 DIAGNOSIS — F320 Major depressive disorder, single episode, mild: Secondary | ICD-10-CM | POA: Diagnosis not present

## 2024-05-19 DIAGNOSIS — F1029 Alcohol dependence with unspecified alcohol-induced disorder: Secondary | ICD-10-CM

## 2024-05-19 DIAGNOSIS — D649 Anemia, unspecified: Secondary | ICD-10-CM | POA: Diagnosis not present

## 2024-05-19 DIAGNOSIS — R739 Hyperglycemia, unspecified: Secondary | ICD-10-CM

## 2024-05-19 DIAGNOSIS — H9202 Otalgia, left ear: Secondary | ICD-10-CM

## 2024-05-19 DIAGNOSIS — K2100 Gastro-esophageal reflux disease with esophagitis, without bleeding: Secondary | ICD-10-CM

## 2024-05-19 MED ORDER — PANTOPRAZOLE SODIUM 40 MG PO TBEC
40.0000 mg | DELAYED_RELEASE_TABLET | Freq: Two times a day (BID) | ORAL | 1 refills | Status: AC
  Filled 2024-05-19 – 2024-06-18 (×2): qty 180, 90d supply, fill #0

## 2024-05-19 MED ORDER — FLUOXETINE HCL 10 MG PO CAPS
10.0000 mg | ORAL_CAPSULE | Freq: Every day | ORAL | 1 refills | Status: AC
  Filled 2024-05-19 – 2024-06-18 (×2): qty 90, 90d supply, fill #0

## 2024-05-19 NOTE — Progress Notes (Signed)
 Subjective:    Patient ID: Austin [name], male    DOB: Apr 12, 1981, 43 y.o.   MRN: [id number]  Patient here for  Chief Complaint  Patient presents with   Medical Management of Chronic Issues    6 mth f/u    HPI Here for a scheduled follow up - F/u regarding increased stress/depression, GERD and sleep apnea. Sleep study revealed mild OSA. Recently evaluated - left ear pain. Referred to ENT. Treated with omnicef  and prednisone . Better. No chest congestion or cough. No sob reported. No abdominal pain or bowel change. Increased stress. New information given regarding referral to a counselor.    Past Medical History:  Diagnosis Date   Alcohol abuse    history of   Allergy    Anxiety    Depression    GERD (gastroesophageal reflux disease)    Heart murmur    Hypertension    Ulcer    history of bleeding ulcer requiring blood transfusions   Past Surgical History:  Procedure Laterality Date   COLONOSCOPY WITH PROPOFOL  N/A 12/12/2019   Procedure: COLONOSCOPY WITH PROPOFOL ;  Surgeon: Toledo, Ladell POUR, MD;  Location: [facility];  Service: Gastroenterology;  Laterality: N/A;   ESOPHAGOGASTRODUODENOSCOPY (EGD) WITH PROPOFOL  N/A 12/12/2019   Procedure: ESOPHAGOGASTRODUODENOSCOPY (EGD) WITH PROPOFOL ;  Surgeon: Toledo, Ladell POUR, MD;  Location: [facility];  Service: Gastroenterology;  Laterality: N/A;   KNEE SURGERY     Left knee - ACL   TONSILLECTOMY AND ADENOIDECTOMY  1992   Family History  Problem Relation Age of Onset   Alcohol abuse Father    Cancer Father        prostate cancer   Arthritis Sister    Cancer Maternal Grandmother        breast cancer   Stroke Maternal Grandmother    Diabetes Maternal Grandmother    Hypertension Maternal Grandmother    Social History   Socioeconomic History   Marital status: Married    Spouse name: Not on file   Number of children: Not on file   Years of education: Not on file   Highest education level: Not on file  Occupational  History   Not on file  Tobacco Use   Smoking status: Former    Current packs/day: 0.00    Average packs/day: 0.5 packs/day for 20.0 years (10.0 ttl pk-yrs)    Types: Cigarettes    Start date: 55    Quit date: 2019    Years since quitting: 6.9   Smokeless tobacco: Never   Tobacco comments:    Quit smoking recently  Vaping Use   Vaping status: Never Used  Substance and Sexual Activity   Alcohol use: Yes    Alcohol/week: 2.0 standard drinks of alcohol    Types: 2 Cans of beer per week   Drug use: Yes    Types: Marijuana    Comment: marijuana   Sexual activity: Not on file  Other Topics Concern   Not on file  Social History Narrative   Not on file   Social Drivers of Health   Financial Resource Strain: Low Risk  (02/14/2021)   Overall Financial Resource Strain (CARDIA)    Difficulty of Paying Living Expenses: Not hard at all  Food Insecurity: Not on file  Transportation Needs: Not on file  Physical Activity: Not on file  Stress: Not on file  Social Connections: Not on file     Review of Systems  Constitutional:  Negative for appetite change and unexpected weight  change.  HENT:  Negative for congestion and sinus pressure.   Respiratory:  Negative for cough, chest tightness and shortness of breath.   Cardiovascular:  Negative for chest pain, palpitations and leg swelling.  Gastrointestinal:  Negative for abdominal pain, diarrhea, nausea and vomiting.  Genitourinary:  Negative for difficulty urinating and dysuria.  Musculoskeletal:  Negative for joint swelling and myalgias.  Skin:  Negative for color change and rash.  Neurological:  Negative for dizziness and headaches.  Psychiatric/Behavioral:  Negative for agitation.        Increased stress. Discussed. Discussed alcohol intake.        Objective:     BP 138/84   Pulse 87   Temp 98 F (36.7 C) (Oral)   Ht 5' 5 (1.651 m)   Wt 194 lb (88 kg)   SpO2 98%   BMI 32.28 kg/m  Wt Readings from Last 3 Encounters:   05/19/24 194 lb (88 kg)  05/03/24 194 lb 6.4 oz (88.2 kg)  02/16/24 188 lb (85.3 kg)    Physical Exam Vitals reviewed.  Constitutional:      General: He is not in acute distress.    Appearance: Normal appearance. He is well-developed.  HENT:     Head: Normocephalic and atraumatic.     Right Ear: External ear normal.     Left Ear: External ear normal.     Mouth/Throat:     Pharynx: No oropharyngeal exudate or posterior oropharyngeal erythema.  Eyes:     General: No scleral icterus.       Right eye: No discharge.        Left eye: No discharge.     Conjunctiva/sclera: Conjunctivae normal.  Cardiovascular:     Rate and Rhythm: Normal rate and regular rhythm.  Pulmonary:     Effort: Pulmonary effort is normal. No respiratory distress.     Breath sounds: Normal breath sounds.  Abdominal:     General: Bowel sounds are normal.     Palpations: Abdomen is soft.     Tenderness: There is no abdominal tenderness.  Musculoskeletal:        General: No swelling or tenderness.     Cervical back: Neck supple. No tenderness.  Lymphadenopathy:     Cervical: No cervical adenopathy.  Skin:    Findings: No erythema or rash.  Neurological:     Mental Status: He is alert.  Psychiatric:        Mood and Affect: Mood normal.        Behavior: Behavior normal.         Outpatient Encounter Medications as of 05/19/2024  Medication Sig   acetaminophen (TYLENOL) 500 MG tablet Take 500 mg by mouth 2 (two) times daily as needed.   Ascorbic Acid (VITAMIN C) 100 MG tablet Take 100 mg by mouth daily.   cefdinir  (OMNICEF ) 300 MG capsule Take 1 capsule (300 mg total) by mouth 2 (two) times daily.   cetirizine (ZYRTEC) 10 MG tablet Take 10 mg by mouth daily.   fluticasone  (FLONASE  SENSIMIST) 27.5 MCG/SPRAY nasal spray Place 1 spray into the nose daily.   fluticasone  (FLONASE ) 50 MCG/ACT nasal spray Place 2 sprays into both nostrils daily.   [EXPIRED] predniSONE  (DELTASONE ) 10 MG tablet Take 6 tablets  (60 mg total) by mouth daily for 4 days, THEN 4 tablets (40 mg total) daily for 4 days, THEN 2 tablets (20 mg total) daily for 4 days.   FLUoxetine  (PROZAC ) 10 MG capsule Take 1 capsule (10  mg total) by mouth daily.   pantoprazole  (PROTONIX ) 40 MG tablet Take 1 tablet (40 mg total) by mouth 2 (two) times daily.   [DISCONTINUED] FLUoxetine  (PROZAC ) 10 MG capsule Take 1 capsule (10 mg total) by mouth daily.   [DISCONTINUED] pantoprazole  (PROTONIX ) 40 MG tablet Take 1 tablet (40 mg total) by mouth 2 (two) times daily.   [DISCONTINUED] predniSONE  (DELTASONE ) 10 MG tablet Take 6 tablets (60 mg total) by mouth daily for 1 day, THEN 5 tablets (50 mg total) daily for 1 day, THEN 4 tablets (40 mg total) daily for 1 day, THEN 3 tablets (30 mg total) daily for 1 day, THEN 2 tablets (20 mg total) daily for 1 day, THEN 1 tablet (10 mg total) daily for 1 day.   [DISCONTINUED] scopolamine  (TRANSDERM-SCOP) 1 MG/3DAYS Place 1 patch (1.5 mg total) onto the skin every 3 (three) days. (Patient not taking: Reported on 05/19/2024)   No facility-administered encounter medications on file as of 05/19/2024.     Lab Results  Component Value Date   WBC 5.5 09/11/2023   HGB 13.9 09/11/2023   HCT 40.6 09/11/2023   PLT 341 09/11/2023   GLUCOSE 94 02/23/2024   CHOL 196 02/23/2024   TRIG 71.0 02/23/2024   HDL 82.30 02/23/2024   LDLCALC 100 (H) 02/23/2024   ALT 25 02/23/2024   AST 21 02/23/2024   NA 140 02/23/2024   K 4.5 02/23/2024   CL 98 02/23/2024   CREATININE 0.87 02/23/2024   BUN 8 02/23/2024   CO2 32 02/23/2024   TSH 1.40 09/11/2023   PSA 0.41 05/21/2023   HGBA1C 6.4 02/23/2024       Assessment & Plan:  Major depressive disorder, single episode, mild Assessment & Plan: Continue prozac . Discussed counseling. New information given. Follow. Overall stable.    Hyperglycemia Assessment & Plan: Low carb diet and exercise. Follow met b and A1c.   Orders: -     Basic metabolic panel with GFR; Future -      Hemoglobin A1c; Future -     Lipid panel; Future -     Hepatic function panel; Future  Anemia, unspecified type Assessment & Plan: S/p GI w/up 11/2019 - EGD and colonoscopy.  Follow [facility].    Gastroesophageal reflux disease with esophagitis -     Pantoprazole  Sodium; Take 1 tablet (40 mg total) by mouth 2 (two) times daily.  Dispense: 180 tablet; Refill: 1  Left ear pain Assessment & Plan: Saw ENT. Treated with omnicef  and prednisone . Doing better. Follow.    Gastroesophageal reflux disease, unspecified whether esophagitis present Assessment & Plan: Continue protonix . EGD 11/2019. Upper symptoms controlled.    Alcohol dependence with unspecified alcohol-induced disorder [facility]) Assessment & Plan: Drinking discussed. Information given previously. .new information given today - counseling.    Other orders -     FLUoxetine  HCl; Take 1 capsule (10 mg total) by mouth daily.  Dispense: 90 capsule; Refill: 1     Allena Hamilton, MD

## 2024-05-27 ENCOUNTER — Encounter: Payer: Self-pay | Admitting: Internal Medicine

## 2024-05-27 DIAGNOSIS — K2100 Gastro-esophageal reflux disease with esophagitis, without bleeding: Secondary | ICD-10-CM | POA: Insufficient documentation

## 2024-05-27 NOTE — Assessment & Plan Note (Signed)
 Drinking discussed. Information given previously. .new information given today - counseling.

## 2024-05-27 NOTE — Assessment & Plan Note (Signed)
 Continue prozac . Discussed counseling. New information given. Follow. Overall stable.

## 2024-05-27 NOTE — Assessment & Plan Note (Signed)
S/p GI w/up 11/2019 - EGD and colonoscopy.  Follow cbc.

## 2024-05-27 NOTE — Assessment & Plan Note (Signed)
 Low-carb diet and exercise.  Follow met b and A1c.

## 2024-05-27 NOTE — Assessment & Plan Note (Signed)
 Continue protonix . EGD 11/2019. Upper symptoms controlled.

## 2024-05-27 NOTE — Assessment & Plan Note (Signed)
 Saw ENT. Treated with omnicef  and prednisone . Doing better. Follow.

## 2024-06-18 ENCOUNTER — Other Ambulatory Visit: Payer: Self-pay

## 2024-06-18 ENCOUNTER — Telehealth: Admitting: Family Medicine

## 2024-06-18 DIAGNOSIS — H9202 Otalgia, left ear: Secondary | ICD-10-CM

## 2024-06-18 MED ORDER — CEFDINIR 300 MG PO CAPS: 300.0000 mg | ORAL_CAPSULE | Freq: Two times a day (BID) | ORAL | 0 refills | Status: AC

## 2024-06-18 MED ORDER — PREDNISONE 10 MG (21) PO TBPK: ORAL_TABLET | ORAL | 0 refills | Status: AC

## 2024-06-18 NOTE — Progress Notes (Signed)
 "  E-Visit for Ear Pain - Acute Otitis Media   We are sorry that you are not feeling well. Here is how we plan to help!  Based on what you have shared with me it looks like you have Acute Otitis Media.  Acute Otitis Media is an infection of the middle or inner ear. This type of infection can cause redness, inflammation, and fluid buildup behind the tympanic membrane (ear drum).  The usual symptoms include: Earache/Pain Fever Upper respiratory symptoms Lack of energy/Fatigue/Malaise Slight hearing loss gradually worsening- if the inner ear fills with fluid What causes middle ear infections? Most middle ear infections occur when an infection such as a cold, leads to a build-up of mucus in the middle ear and causes the Eustachian tube (a thin tube that runs from the middle ear to the back of the nose) to become swollen or blocked.   This means mucus can't drain away properly, making it easier for an infection to spread into the middle ear.  How middle ear infections are treated: Most ear infections clear up within three to five days and don't need any specific treatment. If necessary, tylenol or ibuprofen should be used to relieve pain and a high temperature.  If you develop a fever higher than 102, or any significantly worsening symptoms, this could indicate a more serious infection moving to the middle/inner and needs face to face evaluation in an office by a provider.   Antibiotics aren't routinely used to treat middle ear infections, although they may occasionally be prescribed if symptoms persist or are particularly severe. Given your presentation,   I have prescribed Cefdinir  300 mg twice a day for 7 days, and a Prednisone  taper, please follow pharmacy instructions.   Your symptoms should improve over the next 3 days and should resolve in about 7 days. Be sure to complete ALL of the prescription(s) given.  HOME CARE: Wash your hands frequently. If you are prescribed an ear drop, do not  place the tip of the bottle on your ear or touch it with your fingers. You can take Acetaminophen 650 mg every 4-6 hours as needed for pain.  If pain is severe or moderate, you can apply a heating pad (set on low) or hot water bottle (wrapped in a towel) to outer ear for 20 minutes.  This will also increase drainage.  GET HELP RIGHT AWAY IF: Fever is over 102.2 degrees. You develop progressive ear pain or hearing loss. Ear symptoms persist longer than 3 days after treatment.  MAKE SURE YOU: Understand these instructions. Will watch your condition. Will get help right away if you are not doing well or get worse.  Thank you for choosing an e-visit.  Your e-visit answers were reviewed by a board certified advanced clinical practitioner to complete your personal care plan. Depending upon the condition, your plan could have included both over the counter or prescription medications.  Please review your pharmacy choice. Make sure the pharmacy is open so you can pick up the prescription now. If there is a problem, you may contact your provider through [employer] and have the prescription routed to another pharmacy.  Your safety is important to us . If you have drug allergies check your prescription carefully.   For the next 24 hours you can use MyChart to ask questions about today's visit, request a non-urgent call back, or ask for a work or school excuse. You will get an email with a survey after your eVisit asking about  your experience. We would appreciate your feedback. I hope that your e-visit has been valuable and will aid in your recovery.  I have spent 5 minutes in review of e-visit questionnaire, review and updating patient chart, medical decision making and response to patient.   Roosvelt Mater, PA-C      "

## 2024-06-20 ENCOUNTER — Other Ambulatory Visit: Payer: Self-pay

## 2024-07-01 ENCOUNTER — Other Ambulatory Visit: Payer: Self-pay

## 2024-07-01 MED ORDER — AZELASTINE HCL 0.1 % NA SOLN
2.0000 | Freq: Two times a day (BID) | NASAL | 12 refills | Status: AC
  Filled 2024-07-01: qty 30, 30d supply, fill #0

## 2024-07-21 ENCOUNTER — Other Ambulatory Visit

## 2024-07-25 ENCOUNTER — Ambulatory Visit: Admitting: Internal Medicine

## 2024-09-26 ENCOUNTER — Other Ambulatory Visit

## 2024-10-03 ENCOUNTER — Ambulatory Visit: Admitting: Internal Medicine
